# Patient Record
Sex: Female | Born: 1945 | Race: White | Hispanic: No | Marital: Married | Smoking: Former smoker
Health system: Southern US, Community
[De-identification: ages and names within clinical notes are randomized; demographics above are authoritative.]

## PROBLEM LIST (undated history)

## (undated) DIAGNOSIS — G43909 Migraine, unspecified, not intractable, without status migrainosus: Secondary | ICD-10-CM

## (undated) DIAGNOSIS — R911 Solitary pulmonary nodule: Secondary | ICD-10-CM

## (undated) DIAGNOSIS — C3490 Malignant neoplasm of unspecified part of unspecified bronchus or lung: Secondary | ICD-10-CM

## (undated) DIAGNOSIS — R0600 Dyspnea, unspecified: Secondary | ICD-10-CM

## (undated) DIAGNOSIS — I10 Essential (primary) hypertension: Secondary | ICD-10-CM

## (undated) DIAGNOSIS — E7800 Pure hypercholesterolemia, unspecified: Secondary | ICD-10-CM

## (undated) DIAGNOSIS — I350 Nonrheumatic aortic (valve) stenosis: Secondary | ICD-10-CM

## (undated) DIAGNOSIS — T783XXA Angioneurotic edema, initial encounter: Secondary | ICD-10-CM

## (undated) DIAGNOSIS — K219 Gastro-esophageal reflux disease without esophagitis: Secondary | ICD-10-CM

## (undated) DIAGNOSIS — I739 Peripheral vascular disease, unspecified: Secondary | ICD-10-CM

## (undated) DIAGNOSIS — M1990 Unspecified osteoarthritis, unspecified site: Secondary | ICD-10-CM

## (undated) HISTORY — DX: Migraine, unspecified, not intractable, without status migrainosus: G43.909

## (undated) HISTORY — PX: DILATION AND CURETTAGE OF UTERUS: SHX78

## (undated) HISTORY — PX: EYE SURGERY: SHX253

## (undated) HISTORY — PX: TONSILLECTOMY: SUR1361

---

## 2002-09-24 ENCOUNTER — Emergency Department (HOSPITAL_COMMUNITY): Admission: EM | Admit: 2002-09-24 | Discharge: 2002-09-24 | Payer: Self-pay | Admitting: Emergency Medicine

## 2005-08-04 HISTORY — PX: CAROTID STENT: SHX1301

## 2005-08-18 ENCOUNTER — Emergency Department (HOSPITAL_COMMUNITY): Admission: EM | Admit: 2005-08-18 | Discharge: 2005-08-18 | Payer: Self-pay | Admitting: Emergency Medicine

## 2005-09-19 ENCOUNTER — Emergency Department (HOSPITAL_COMMUNITY): Admission: EM | Admit: 2005-09-19 | Discharge: 2005-09-19 | Payer: Self-pay | Admitting: Emergency Medicine

## 2011-02-03 ENCOUNTER — Other Ambulatory Visit: Payer: Self-pay

## 2011-02-03 ENCOUNTER — Other Ambulatory Visit: Payer: Self-pay | Admitting: Obstetrics and Gynecology

## 2011-02-03 DIAGNOSIS — Z139 Encounter for screening, unspecified: Secondary | ICD-10-CM

## 2011-02-06 ENCOUNTER — Ambulatory Visit (HOSPITAL_COMMUNITY)
Admission: RE | Admit: 2011-02-06 | Discharge: 2011-02-06 | Disposition: A | Discharge status: Home / Self Care | Payer: Self-pay | Source: Ambulatory Visit | Attending: Obstetrics and Gynecology | Admitting: Obstetrics and Gynecology

## 2011-02-06 DIAGNOSIS — Z139 Encounter for screening, unspecified: Secondary | ICD-10-CM

## 2012-02-16 ENCOUNTER — Other Ambulatory Visit (HOSPITAL_COMMUNITY): Payer: Self-pay | Admitting: Physician Assistant

## 2012-02-16 DIAGNOSIS — Z139 Encounter for screening, unspecified: Secondary | ICD-10-CM

## 2012-02-24 ENCOUNTER — Ambulatory Visit (HOSPITAL_COMMUNITY)
Admission: RE | Admit: 2012-02-24 | Discharge: 2012-02-24 | Disposition: A | Discharge status: Home / Self Care | Payer: Self-pay | Source: Ambulatory Visit | Attending: Physician Assistant | Admitting: Physician Assistant

## 2012-02-24 DIAGNOSIS — Z1231 Encounter for screening mammogram for malignant neoplasm of breast: Secondary | ICD-10-CM | POA: Insufficient documentation

## 2012-02-24 DIAGNOSIS — Z139 Encounter for screening, unspecified: Secondary | ICD-10-CM

## 2013-02-01 DIAGNOSIS — T783XXA Angioneurotic edema, initial encounter: Secondary | ICD-10-CM

## 2013-02-01 HISTORY — DX: Angioneurotic edema, initial encounter: T78.3XXA

## 2013-02-27 ENCOUNTER — Encounter (HOSPITAL_COMMUNITY): Payer: Self-pay

## 2013-02-27 ENCOUNTER — Inpatient Hospital Stay (HOSPITAL_COMMUNITY)
Admission: EM | Admit: 2013-02-27 | Discharge: 2013-03-01 | DRG: 916 | Disposition: A | Discharge status: Home / Self Care | Payer: Medicare Other | Attending: Pulmonary Disease | Admitting: Pulmonary Disease

## 2013-02-27 DIAGNOSIS — T39095A Adverse effect of salicylates, initial encounter: Secondary | ICD-10-CM | POA: Diagnosis present

## 2013-02-27 DIAGNOSIS — E7800 Pure hypercholesterolemia, unspecified: Secondary | ICD-10-CM | POA: Diagnosis present

## 2013-02-27 DIAGNOSIS — T783XXD Angioneurotic edema, subsequent encounter: Secondary | ICD-10-CM

## 2013-02-27 DIAGNOSIS — K219 Gastro-esophageal reflux disease without esophagitis: Secondary | ICD-10-CM | POA: Diagnosis present

## 2013-02-27 DIAGNOSIS — T783XXA Angioneurotic edema, initial encounter: Principal | ICD-10-CM

## 2013-02-27 DIAGNOSIS — E785 Hyperlipidemia, unspecified: Secondary | ICD-10-CM | POA: Diagnosis present

## 2013-02-27 DIAGNOSIS — I10 Essential (primary) hypertension: Secondary | ICD-10-CM | POA: Diagnosis present

## 2013-02-27 DIAGNOSIS — F17200 Nicotine dependence, unspecified, uncomplicated: Secondary | ICD-10-CM | POA: Diagnosis present

## 2013-02-27 DIAGNOSIS — K148 Other diseases of tongue: Secondary | ICD-10-CM

## 2013-02-27 HISTORY — DX: Essential (primary) hypertension: I10

## 2013-02-27 HISTORY — DX: Angioneurotic edema, initial encounter: T78.3XXA

## 2013-02-27 HISTORY — DX: Peripheral vascular disease, unspecified: I73.9

## 2013-02-27 HISTORY — DX: Pure hypercholesterolemia, unspecified: E78.00

## 2013-02-27 HISTORY — DX: Gastro-esophageal reflux disease without esophagitis: K21.9

## 2013-02-27 LAB — CBC WITH DIFFERENTIAL/PLATELET
Basophils Absolute: 0 10*3/uL (ref 0.0–0.1)
Basophils Relative: 0 % (ref 0–1)
Eosinophils Absolute: 0.1 10*3/uL (ref 0.0–0.7)
Eosinophils Relative: 1 % (ref 0–5)
HCT: 44.1 % (ref 36.0–46.0)
Hemoglobin: 15.2 g/dL — ABNORMAL HIGH (ref 12.0–15.0)
Lymphocytes Relative: 14 % (ref 12–46)
Lymphs Abs: 2 10*3/uL (ref 0.7–4.0)
MCH: 32.9 pg (ref 26.0–34.0)
MCHC: 34.5 g/dL (ref 30.0–36.0)
MCV: 95.5 fL (ref 78.0–100.0)
Monocytes Absolute: 0.4 10*3/uL (ref 0.1–1.0)
Monocytes Relative: 3 % (ref 3–12)
Neutro Abs: 11.7 10*3/uL — ABNORMAL HIGH (ref 1.7–7.7)
Neutrophils Relative %: 82 % — ABNORMAL HIGH (ref 43–77)
Platelets: 289 10*3/uL (ref 150–400)
RBC: 4.62 MIL/uL (ref 3.87–5.11)
RDW: 13.5 % (ref 11.5–15.5)
WBC: 14.2 10*3/uL — ABNORMAL HIGH (ref 4.0–10.5)

## 2013-02-27 LAB — POCT I-STAT, CHEM 8
BUN: 16 mg/dL (ref 6–23)
Calcium, Ion: 1.21 mmol/L (ref 1.13–1.30)
Chloride: 110 mEq/L (ref 96–112)
Creatinine, Ser: 0.9 mg/dL (ref 0.50–1.10)
Glucose, Bld: 138 mg/dL — ABNORMAL HIGH (ref 70–99)
HCT: 46 % (ref 36.0–46.0)
Hemoglobin: 15.6 g/dL — ABNORMAL HIGH (ref 12.0–15.0)
Potassium: 3.6 mEq/L (ref 3.5–5.1)
Sodium: 142 mEq/L (ref 135–145)
TCO2: 22 mmol/L (ref 0–100)

## 2013-02-27 LAB — TYPE AND SCREEN
ABO/RH(D): O NEG
Antibody Screen: NEGATIVE

## 2013-02-27 MED ORDER — EPINEPHRINE 0.3 MG/0.3ML IJ SOAJ
0.3000 mg | Freq: Once | INTRAMUSCULAR | Status: AC
  Administered 2013-02-27: 0.3 mg via INTRAMUSCULAR
  Filled 2013-02-27: qty 0.3

## 2013-02-27 MED ORDER — SODIUM CHLORIDE 0.9 % IV SOLN
INTRAVENOUS | Status: DC
  Administered 2013-02-27: 17:00:00 via INTRAVENOUS

## 2013-02-27 MED ORDER — DIPHENHYDRAMINE HCL 50 MG/ML IJ SOLN
INTRAMUSCULAR | Status: AC
  Filled 2013-02-27: qty 1

## 2013-02-27 MED ORDER — DIPHENHYDRAMINE HCL 50 MG/ML IJ SOLN
50.0000 mg | Freq: Once | INTRAMUSCULAR | Status: AC
  Administered 2013-02-27: 50 mg via INTRAVENOUS
  Filled 2013-02-27: qty 1

## 2013-02-27 MED ORDER — FAMOTIDINE IN NACL 20-0.9 MG/50ML-% IV SOLN
20.0000 mg | Freq: Once | INTRAVENOUS | Status: AC
  Administered 2013-02-27: 20 mg via INTRAVENOUS
  Filled 2013-02-27: qty 50

## 2013-02-27 MED ORDER — FAMOTIDINE IN NACL 20-0.9 MG/50ML-% IV SOLN
20.0000 mg | Freq: Four times a day (QID) | INTRAVENOUS | Status: DC
  Administered 2013-02-27 – 2013-03-01 (×6): 20 mg via INTRAVENOUS
  Filled 2013-02-27 (×9): qty 50

## 2013-02-27 MED ORDER — DIPHENHYDRAMINE HCL 50 MG/ML IJ SOLN
25.0000 mg | Freq: Four times a day (QID) | INTRAMUSCULAR | Status: DC
  Administered 2013-02-27 – 2013-03-01 (×3): 25 mg via INTRAVENOUS
  Filled 2013-02-27 (×5): qty 0.5
  Filled 2013-02-27 (×3): qty 1
  Filled 2013-02-27: qty 0.5

## 2013-02-27 MED ORDER — METHYLPREDNISOLONE SODIUM SUCC 125 MG IJ SOLR
80.0000 mg | Freq: Four times a day (QID) | INTRAMUSCULAR | Status: DC
  Administered 2013-02-27 – 2013-03-01 (×6): 80 mg via INTRAVENOUS
  Filled 2013-02-27 (×2): qty 1.28
  Filled 2013-02-27: qty 2
  Filled 2013-02-27: qty 1.28
  Filled 2013-02-27: qty 2
  Filled 2013-02-27: qty 1.28
  Filled 2013-02-27: qty 2
  Filled 2013-02-27 (×5): qty 1.28
  Filled 2013-02-27: qty 2
  Filled 2013-02-27: qty 1.28

## 2013-02-27 MED ORDER — METHYLPREDNISOLONE SODIUM SUCC 125 MG IJ SOLR
125.0000 mg | Freq: Once | INTRAMUSCULAR | Status: AC
  Administered 2013-02-27: 125 mg via INTRAVENOUS
  Filled 2013-02-27: qty 2

## 2013-02-27 NOTE — Progress Notes (Signed)
eLink Physician-Brief Progress Note Patient Name: Courtney Gill DOB: 1945-09-21 MRN: [id number]  Date of Service  02/27/2013   HPI/Events of Note   Pt arrived from [facility], no overt distress  eICU Interventions   Basic admission orders placed, Solu-Medrol / Benadryl / Pepcid ordered Bedside MD to see    Intervention Category Intermediate Interventions: Communication with other healthcare providers and/or family  Lonia Farber 02/27/2013, 10:30 PM

## 2013-02-27 NOTE — ED Notes (Signed)
Transfusion complete. No reaction suspected.

## 2013-02-27 NOTE — ED Notes (Signed)
Pt c/o tongue swelling starting this afternoon.  Reports difficulty talking and swallowing.  Denies difficulty breathing.  Denies any new medications.  Denies any food allergies that she is aware of.  Reports had similar episode in the past and her doctor told her to take benadryl.  Pt took benadryl around 4:20 today.

## 2013-02-27 NOTE — ED Notes (Signed)
Pt c/o swelling of tongue which she first noticed around 2:30 this afternoon. Pt states swelling has become progressively worse. Pt denies difficulty breathing but does reports difficulty swallowing and talking. Pt states she had these symptoms once before and was told she was having an allergic reaction. Pt took benadryl at home pta without relief.

## 2013-02-27 NOTE — ED Provider Notes (Signed)
CSN: [id number]     Arrival date & time 02/27/13  1649 History  This chart was scribed for Ward Givens, MD by Ardelia Mems, ED Scribe. This patient was seen in room [facility]14[facility]14 and the patient's care was started at 5:04 PM.   First MD Initiated Contact with Patient 02/27/13 1657     Chief Complaint  Patient presents with  . Oral Swelling    The history is provided by the patient. No language interpreter was used.   HPI Comments: Courtney Gill is a 67 y.o. female with a history of hypercholesteremia who presents to the Emergency Department complaining of 2.5 hours of sudden onset, gradually worsening tongue swelling. She reports associated mild trouble swallowing and trouble talking but she denies itching and difficulty breathing as associated symptoms. She has taken benadryl at home without relief. She denies recent bee stings. She states that she had an occurrence of right-sided facial swelling with lip swelling on 01/27/13, but states that this is the only time she has had similar symptoms. She reports that facial swelling or similar symptoms do not run in her family. She states that her PCP took her off of her daily Amlodipine and increased her dosage of Simvastatin on 02/17/13. She states that her only other daily medications are acid reducers, fish oil, calcium and vitamin D. She denies fever, rash, wheezing, SOB or any other symptoms. There is no family history or similar swelling.     She denies alcohol use, but is a current every day smoker. She is retired.  PCP- Dr. Catalina Pizza   Past Medical History  Diagnosis Date  . Hypercholesterolemia    Past Surgical History  Procedure Laterality Date  . Eye surgery    . Tonsillectomy     No family history on file. History  Substance Use Topics  . Smoking status: Current Every Day Smoker  . Smokeless tobacco: Not on file  . Alcohol Use: No  lives at home Lives with spouse   OB History   Grav Para Term Preterm Abortions TAB SAB  Ect Mult Living                 Review of Systems  Constitutional: Negative for fever.  HENT: Positive for facial swelling and trouble swallowing.   Respiratory: Negative for shortness of breath and wheezing.   Skin: Negative for rash.  Neurological: Positive for speech difficulty (due to swelling).  All other systems reviewed and are negative.    Allergies  Review of patient's allergies indicates no known allergies.  Home Medications   Current Outpatient Rx  Name  Route  Sig  Dispense  Refill  . aspirin EC 81 MG tablet   Oral   Take 81 mg by mouth daily.         . Calcium Carbonate-Vitamin D (CALCIUM + D PO)   Oral   Take 1 tablet by mouth 2 (two) times daily.         . fish oil-omega-3 fatty acids 1000 MG capsule   Oral   Take 1 g by mouth 2 (two) times daily.         Marland Kitchen ibuprofen (ADVIL,MOTRIN) 200 MG tablet   Oral   Take 200 mg by mouth every 6 (six) hours as needed for pain.         Marland Kitchen omeprazole (PRILOSEC) 20 MG capsule   Oral   Take 20 mg by mouth every evening.         Marland Kitchen  simvastatin (ZOCOR) 20 MG tablet   Oral   Take 20 mg by mouth every evening.           BP 129/85  Pulse 86  Temp(Src) 98.4 F (36.9 C) (Oral)  Resp 17  SpO2 95%  Vital signs normal    Physical Exam  Nursing note and vitals reviewed. Constitutional: She is oriented to person, place, and time. She appears well-developed and well-nourished.  Non-toxic appearance. She does not appear ill. No distress.  HENT:  Head: Normocephalic and atraumatic.    Right Ear: External ear normal.  Left Ear: External ear normal.  Nose: Nose normal. No mucosal edema or rhinorrhea.  Mouth/Throat: Oropharynx is clear and moist and mucous membranes are normal. No dental abscesses or edematous.  Fullness and firmness submentally. Diffuse swelling of both sides of tongue. Talks like her tongue is thick.  Eyes: Conjunctivae and EOM are normal. Pupils are equal, round, and reactive to light.   Neck: Normal range of motion and full passive range of motion without pain. Neck supple.  Cardiovascular: Normal rate, regular rhythm and normal heart sounds.  Exam reveals no gallop and no friction rub.   No murmur heard. Pulmonary/Chest: Effort normal and breath sounds normal. No respiratory distress. She has no wheezes. She has no rhonchi. She has no rales. She exhibits no tenderness and no crepitus.  Abdominal: Soft. Normal appearance and bowel sounds are normal. She exhibits no distension. There is no tenderness. There is no rebound and no guarding.  Musculoskeletal: Normal range of motion. She exhibits no edema and no tenderness.  Moves all extremities well.   Neurological: She is alert and oriented to person, place, and time. She has normal strength. No cranial nerve deficit.  Skin: Skin is warm, dry and intact. No rash noted. No erythema. No pallor.  Psychiatric: She has a normal mood and affect. Her speech is normal and behavior is normal. Her mood appears not anxious.    ED Course   Medications  0.9 %  sodium chloride infusion ( Intravenous Stopped 02/27/13 2107)  methylPREDNISolone sodium succinate (SOLU-MEDROL) 125 mg/2 mL injection 125 mg (125 mg Intravenous Given 02/27/13 1715)  diphenhydrAMINE (BENADRYL) injection 50 mg (50 mg Intravenous Given 02/27/13 1713)  famotidine (PEPCID) IVPB 20 mg (0 mg Intravenous Stopped 02/27/13 1809)  EPINEPHrine (EPI-PEN) injection 0.3 mg (0.3 mg Intramuscular Given 02/27/13 1818)  FFP i unit  Procedures (including critical care time)  DIAGNOSTIC STUDIES: Oxygen Saturation is 95% on  RA, normal by my interpretation.    COORDINATION OF CARE: 5:15 PM- Pt advised of plan for medications to reduce her swelling, along with plan to be observed in the ED and pt agrees.  17:45 no change, not worse, not better.   6:05 PM- Recheck with pt and pt states that her swelling has slightly decreased after receiving medication. Her speech appears to have  improved. Pt denies personal history of cardiovascular disease or symptoms, but states that her mother's  brother died of an MI. Pt is advise of plan to receive an epinephrine injection and she is agreeable.  6:36 PM- Recheck with pt and she states that she feels no different after receiving epinephrine. Pt's tongue remains swollen. She appears to have more swelling in her submental area, left greater than right. Pt advised of plan to receive plasma and pt agrees.  21:00 Pt received 1 unit of FFP  19:07 Dr Marin Shutter, critical care at [facility], accepts in transfer to admission to ICU. Pt agreeable.  19:10 Pt's tongue is starting to look alittle less swollen, still has a lot of swelling in submental area.   9:32 PM- Recheck with pt and pt is feeling better. She indicates that her tongue swelling has markedly improved. She is able to move her tongue better. She still retains swelling in her submental area.  21:50 Dr Marin Shutter contacted that patient's swelling is much improved. EMS has just left to transport patient to [facility].   1. Tongue edema   2. Angioedema, initial encounter     Plan transfer to [facility] for admission   Devoria Albe, MD, FACEP   CRITICAL CARE Performed by: Devoria Albe L Total critical care time: 38 min Critical care time was exclusive of separately billable procedures and treating other patients. Critical care was necessary to treat or prevent imminent or life-threatening deterioration. Critical care was time spent personally by me on the following activities: development of treatment plan with patient and/or surrogate as well as nursing, discussions with consultants, evaluation of patient's response to treatment, examination of patient, obtaining history from patient or surrogate, ordering and performing treatments and interventions, ordering and review of laboratory studies, ordering and review of radiographic studies, pulse oximetry and re-evaluation of patient's  condition.   MDM  Pt had a mild episode of angioedema of her right lips about 1 month ago and presents today with angioedema of her tongue. She is not on an ACEI but she is on a statin, which can also trigger angioedema. Her statin dose was increased recently also.     I personally performed the services described in this documentation, which was scribed in my presence. The recorded information has been reviewed and considered.  Devoria Albe, MD, Armando Gang    Ward Givens, MD 02/27/13 2224

## 2013-02-28 DIAGNOSIS — E785 Hyperlipidemia, unspecified: Secondary | ICD-10-CM | POA: Diagnosis present

## 2013-02-28 DIAGNOSIS — T783XXA Angioneurotic edema, initial encounter: Secondary | ICD-10-CM

## 2013-02-28 DIAGNOSIS — J384 Edema of larynx: Secondary | ICD-10-CM

## 2013-02-28 DIAGNOSIS — I10 Essential (primary) hypertension: Secondary | ICD-10-CM | POA: Diagnosis present

## 2013-02-28 LAB — CREATININE, SERUM
Creatinine, Ser: 0.83 mg/dL (ref 0.50–1.10)
GFR calc Af Amer: 84 mL/min — ABNORMAL LOW (ref >90)
GFR calc non Af Amer: 72 mL/min — ABNORMAL LOW (ref >90)

## 2013-02-28 LAB — PREPARE FRESH FROZEN PLASMA: Unit division: 0

## 2013-02-28 LAB — CBC
HCT: 40.9 % (ref 36.0–46.0)
Hemoglobin: 14.5 g/dL (ref 12.0–15.0)
MCH: 33.3 pg (ref 26.0–34.0)
MCHC: 35.5 g/dL (ref 30.0–36.0)
MCV: 94 fL (ref 78.0–100.0)
Platelets: 223 10*3/uL (ref 150–400)
RBC: 4.35 MIL/uL (ref 3.87–5.11)
RDW: 13.4 % (ref 11.5–15.5)
WBC: 5.9 10*3/uL (ref 4.0–10.5)

## 2013-02-28 LAB — MRSA PCR SCREENING: MRSA by PCR: NEGATIVE

## 2013-02-28 MED ORDER — IBUPROFEN 400 MG PO TABS
400.0000 mg | ORAL_TABLET | Freq: Once | ORAL | Status: AC
  Administered 2013-02-28: 400 mg via ORAL
  Filled 2013-02-28: qty 1

## 2013-02-28 MED ORDER — SODIUM CHLORIDE 0.9 % IV SOLN: 250.0000 mL | INTRAVENOUS | Status: DC | PRN

## 2013-02-28 MED ORDER — HEPARIN SODIUM (PORCINE) 5000 UNIT/ML IJ SOLN
5000.0000 [IU] | Freq: Three times a day (TID) | INTRAMUSCULAR | Status: DC
  Administered 2013-02-28 – 2013-03-01 (×5): 5000 [IU] via SUBCUTANEOUS
  Filled 2013-02-28 (×9): qty 1

## 2013-02-28 NOTE — Progress Notes (Signed)
eLink Physician-Brief Progress Note Patient Name: Courtney Gill DOB: [birth year] MRN: [id number]  Date of Service  02/28/2013   HPI/Events of Note   Pt c/o HA. She is on and appears to tolerate ibuprofen at home. No current evidence to suggest that this is culprit med for angioedema.   eICU Interventions  One-time dose ibuprofen 400, follow swelling. If tolerated then can add to [facility] as a prn med   Intervention Category Intermediate Interventions: Pain - evaluation and management  [name] S. 02/28/2013, 1:40 AM

## 2013-02-28 NOTE — Progress Notes (Signed)
Patient is "Floor" status at this time.  Awaiting med-surg floor bed availability for transfer as ordered per Dr.Wright.  No acute distress noted Safety maintained. VSS.

## 2013-02-28 NOTE — Progress Notes (Addendum)
Verbal/phone report given to "Mardella Layman" RN on unit 6N.  Patient to be transferred to room 6N21C-01 via wheelchair to bed. All belongings including patient's purse, cell phone and charger sent with patient to 6N.  No acute distress noted. VSS. Safety maintained.

## 2013-02-28 NOTE — Progress Notes (Signed)
PULMONARY  / CRITICAL CARE MEDICINE  Name: Courtney Gill MRN: [id number] DOB: 12-03-1945    ADMISSION DATE:  02/27/2013  PRIMARY SERVICE: PCCM  CHIEF COMPLAINT:  Tongue swelling. Referred from [facility] for management of angioedema.   BRIEF PATIENT DESCRIPTION:  67 years old female with PMH relevant for dyslipidemia and mild hypertension not on any medications. Presented to [facility] with angioedema of the tongue. Transferred to [facility] ICU for close monitoring of her airway.   SIGNIFICANT EVENTS / STUDIES:    LINES / TUBES: - Peripheral IV's  CULTURES: No cultures  ANTIBIOTICS: No antibiotics   SUBJECTIVE:  Swelling is gone Pt was taking simvastatin, amlodipine, omeprazole at home before. Also fish oil, calcium/vit D VITAL SIGNS: Temp:  [97.5 F (36.4 C)-98.4 F (36.9 C)] 97.5 F (36.4 C) (07/28 0804) Pulse Rate:  [54-86] 64 (07/28 0800) Resp:  [11-24] 12 (07/28 0800) BP: (112-139)/(49-85) 122/67 mmHg (07/28 0800) SpO2:  [93 %-97 %] 97 % (07/28 0800) Weight:  [69.4 kg (153 lb)] 69.4 kg (153 lb) (07/28 0500) HEMODYNAMICS:   VENTILATOR SETTINGS:   INTAKE / OUTPUT: Intake/Output     07/27 0701 - 07/28 0700 07/28 0701 - 07/29 0700   Blood 316    IV Piggyback 100    Total Intake(mL/kg) 416 (6)    Net +416          Urine Occurrence 2 x      PHYSICAL EXAMINATION: General: No apparent distress. Eyes: Anicteric sclerae. ENT: Oropharynx clear. Moist mucous membranes. No thrush, tongue edema gone Lymph: No cervical, supraclavicular, or axillary lymphadenopathy. Heart: Normal S1, S2. No murmurs, rubs, or gallops appreciated. No bruits, equal pulses. Lungs: Normal excursion, no dullness to percussion. Good air movement bilaterally, without wheezes or crackles. Normal upper airway sounds without evidence of stridor. Abdomen: Abdomen soft, non-tender and not distended, normoactive bowel sounds. No hepatosplenomegaly or masses. Musculoskeletal: No clubbing or  synovitis. Skin: No rashes or lesions Neuro: No focal neurologic deficits.  LABS:  Recent Labs Lab 02/27/13 1859 02/27/13 1915 02/28/13 0405  HGB 15.2* 15.6* 14.5  WBC 14.2*  --  5.9  PLT 289  --  223  NA  --  142  --   K  --  3.6  --   CL  --  110  --   GLUCOSE  --  138*  --   BUN  --  16  --   CREATININE  --  0.90 0.83   No results found for this basename: GLUCAP,  in the last 168 hours   ASSESSMENT / PLAN:  PULMONARY A: 1) Angioedema, likely related to aspirin. No history of asthma but aspirin related angioedema and asthma only coexist in about 20% of the cases. Aspirin angioedema is a non allergic reaction most of the time. Angioedema has resolved  P:   - stay off [employer] PPI and simvastatin allergy Tfr to floor, d/c later today if no issues with edema    Dorcas Carrow Beeper  [phone]  Cell  [phone]  If no response or cell goes to voicemail, call beeper [phone]  Pulmonary and Critical Care Medicine [facility] Pager: [phone]  02/28/2013, 9:35 AM

## 2013-02-28 NOTE — H&P (Signed)
PULMONARY  / CRITICAL CARE MEDICINE  Name: Courtney Gill MRN: [id number] DOB: 30-Nov-1945    ADMISSION DATE:  02/27/2013  PRIMARY SERVICE: PCCM  CHIEF COMPLAINT:  Tongue swelling. Referred from [facility] for management of angioedema.   BRIEF PATIENT DESCRIPTION:  67 years old female with PMH relevant for dyslipidemia and mild hypertension not on any medications. Presented to [facility] with angioedema of the tongue. Transferred to [facility] ICU for close monitoring of her airway.   SIGNIFICANT EVENTS / STUDIES:    LINES / TUBES: - Peripheral IV's  CULTURES: No cultures  ANTIBIOTICS: No antibiotics  HISTORY OF PRESENT ILLNESS:   67 years old female with PMH relevant for dyslipidemia and mild hypertension not on any medications. Presented to [facility] with 3 hrs history of worsening tongue swelling with no obvious trigger. She had a similar episode with more lip and facial swelling on July 26. She is not on an ACE inhibitor but she does take aspirin daily. Denies CP, SOB, wheezing, fever, rash. She does not have history of asthma. At [facility] she was hemodynamically stable, saturating fine and in no distress at all times. She receive 1 dose of IM epinephrine and one unit of FFP with significant improvement. She was transferred to [facility] for close monitoring of her airway. At the time of my exam the patient is awake, alert, oriented and hemodynamically stable. Her tongue swelling is significantly improved.   PAST MEDICAL HISTORY :  Past Medical History  Diagnosis Date  . Hypercholesterolemia    Past Surgical History  Procedure Laterality Date  . Eye surgery    . Tonsillectomy     Prior to Admission medications   Medication Sig Start Date End Date Taking? Authorizing Provider  aspirin EC 81 MG tablet Take 81 mg by mouth daily.   Yes Historical Provider, MD  Calcium Carbonate-Vitamin D (CALCIUM + D PO) Take 1 tablet by mouth 2 (two) times daily.   Yes Historical Provider, MD  fish  oil-omega-3 fatty acids 1000 MG capsule Take 1 g by mouth 2 (two) times daily.   Yes Historical Provider, MD  ibuprofen (ADVIL,MOTRIN) 200 MG tablet Take 200 mg by mouth every 6 (six) hours as needed for pain.   Yes Historical Provider, MD  omeprazole (PRILOSEC) 20 MG capsule Take 20 mg by mouth every evening.   Yes Historical Provider, MD  simvastatin (ZOCOR) 20 MG tablet Take 20 mg by mouth every evening.   Yes Historical Provider, MD   No Known Allergies  FAMILY HISTORY:  No family history on file. SOCIAL HISTORY:  reports that she has been smoking.  She does not have any smokeless tobacco history on file. She reports that she does not drink alcohol or use illicit drugs.  REVIEW OF SYSTEMS:   All systems reviewed and found negative except for what I mentioned in the HPI.  SUBJECTIVE:   VITAL SIGNS: Temp:  [98 F (36.7 C)-98.4 F (36.9 C)] 98 F (36.7 C) (07/27 2100) Pulse Rate:  [66-86] 66 (07/28 0000) Resp:  [11-24] 11 (07/28 0000) BP: (126-139)/(50-85) 133/57 mmHg (07/28 0000) SpO2:  [93 %-97 %] 97 % (07/28 0000) HEMODYNAMICS:   VENTILATOR SETTINGS:   INTAKE / OUTPUT: Intake/Output     07/27 0701 - 07/28 0700   Blood 316   IV Piggyback 50   Total Intake 366   Net +366       Urine Occurrence 1 x     PHYSICAL EXAMINATION: General: No apparent  distress. Eyes: Anicteric sclerae. ENT: Oropharynx clear. Moist mucous membranes. No thrush, mild tongue swelling. Lymph: No cervical, supraclavicular, or axillary lymphadenopathy. Heart: Normal S1, S2. No murmurs, rubs, or gallops appreciated. No bruits, equal pulses. Lungs: Normal excursion, no dullness to percussion. Good air movement bilaterally, without wheezes or crackles. Normal upper airway sounds without evidence of stridor. Abdomen: Abdomen soft, non-tender and not distended, normoactive bowel sounds. No hepatosplenomegaly or masses. Musculoskeletal: No clubbing or synovitis. Skin: No rashes or lesions Neuro: No  focal neurologic deficits.  LABS:  Recent Labs Lab 02/27/13 1859 02/27/13 1915  HGB 15.2* 15.6*  WBC 14.2*  --   PLT 289  --   NA  --  142  K  --  3.6  CL  --  110  GLUCOSE  --  138*  BUN  --  16  CREATININE  --  0.90   No results found for this basename: GLUCAP,  in the last 168 hours   ASSESSMENT / PLAN:  PULMONARY A: 1) Angioedema, likely related to aspirin. No history of asthma but aspirin related angioedema and asthma only coexist in about 20% of the cases. Aspirin angioedema is a non allergic reaction most of the time. P:   - Significant improvement after one unit of FFP - Will do short term solumedrol and benadryl - Recommend not to restart aspirin since this is the most likely cause. - will continue close monitoring of her airway in the ICU  CARDIOVASCULAR A:  1) Hemodynamically stable P:  - ICU monitoring - DVT prophylaxis with SQ heparin  RENAL A:   1) No issues   GASTROINTESTINAL A:   1) No issues 2) History of GERD P: - PEPCID  HEMATOLOGIC A:   1) No issues   INFECTIOUS A:  1) Elevated WBC. No evidence of acute infection P:   - No antibiotics  ENDOCRINE A:   1) No issues    NEUROLOGIC A:   1) No issues   I have personally obtained a history, examined the patient, evaluated laboratory and imaging results, formulated the assessment and plan and placed orders. CRITICAL CARE: Critical Care Time devoted to patient care services described in this note is 45 minutes.   Overton Mam, MD Pulmonary and Critical Care Medicine [facility] Pager: [phone]  02/28/2013, 1:34 AM

## 2013-03-01 ENCOUNTER — Encounter (HOSPITAL_COMMUNITY): Payer: Self-pay | Admitting: Pulmonary Disease

## 2013-03-01 DIAGNOSIS — Z5189 Encounter for other specified aftercare: Secondary | ICD-10-CM

## 2013-03-01 DIAGNOSIS — J384 Edema of larynx: Secondary | ICD-10-CM

## 2013-03-01 MED ORDER — PREDNISONE 20 MG PO TABS: ORAL_TABLET | ORAL | Status: DC

## 2013-03-01 MED ORDER — FAMOTIDINE 20 MG PO TABS
20.0000 mg | ORAL_TABLET | Freq: Two times a day (BID) | ORAL | Status: DC
Start: 2013-03-01 — End: 2013-03-01
  Administered 2013-03-01: 20 mg via ORAL
  Filled 2013-03-01: qty 1

## 2013-03-01 MED ORDER — DIPHENHYDRAMINE HCL 50 MG/ML IJ SOLN: 25.0000 mg | Freq: Four times a day (QID) | INTRAMUSCULAR | Status: DC | PRN

## 2013-03-01 MED ORDER — CLOPIDOGREL BISULFATE 75 MG PO TABS
75.0000 mg | ORAL_TABLET | Freq: Every day | ORAL | Status: DC
  Administered 2013-03-01: 75 mg via ORAL
  Filled 2013-03-01: qty 1

## 2013-03-01 MED ORDER — PREDNISONE 20 MG PO TABS
20.0000 mg | ORAL_TABLET | Freq: Every day | ORAL | Status: DC
  Administered 2013-03-01: 20 mg via ORAL
  Filled 2013-03-01: qty 1

## 2013-03-01 MED ORDER — FAMOTIDINE 20 MG PO TABS: 20.0000 mg | ORAL_TABLET | Freq: Two times a day (BID) | ORAL | Status: DC

## 2013-03-01 MED ORDER — DIPHENHYDRAMINE HCL 50 MG PO CAPS: 50.0000 mg | ORAL_CAPSULE | Freq: Four times a day (QID) | ORAL | Status: DC | PRN

## 2013-03-01 MED ORDER — CLOPIDOGREL BISULFATE 75 MG PO TABS: 75.0000 mg | ORAL_TABLET | Freq: Every day | ORAL | Status: DC

## 2013-03-01 MED ORDER — EPINEPHRINE 0.3 MG/0.3ML IJ SOAJ: 0.3000 mg | Freq: Once | INTRAMUSCULAR | Status: DC

## 2013-03-01 MED ORDER — PREDNISONE 10 MG PO TABS: ORAL_TABLET | ORAL | Status: DC

## 2013-03-01 MED ORDER — DIPHENHYDRAMINE HCL 25 MG PO CAPS: 50.0000 mg | ORAL_CAPSULE | Freq: Four times a day (QID) | ORAL | Status: DC | PRN

## 2013-03-01 NOTE — Progress Notes (Signed)
Pt and her boyfriend given discharge instructions.  They verbalized understanding of all instructions.  Also given 5 new prescriptions and she verbalized understanding of those meds.  No further questions at this time.  Pt taken down via wheelchair for discharge home.

## 2013-03-01 NOTE — Progress Notes (Addendum)
PULMONARY  / CRITICAL CARE MEDICINE  Name: Courtney Gill MRN: [id number] DOB: Jun 27, 1946    ADMISSION DATE:  02/27/2013  PRIMARY SERVICE: PCCM  CHIEF COMPLAINT:  Tongue swelling. Referred from [facility] for management of angioedema.   BRIEF PATIENT DESCRIPTION:  67 yo with hx of hyperlipidemia, HTN, GERD, and PAD s/p Rt carotid stent in 2007 transferred from [facility] with angioedema of her tongue.  SIGNIFICANT EVENTS: 7/27 transfer to [facility] 7/28 transfer to medical floor  SUBJECTIVE:  Feels much better.  Denies cough, wheeze, dysphagia/odynophagia, slurring of speech, or chest pain.  She reports her symptoms started after she ate at [employer], but does not recall eating anything unusual.  She has been on aspirin, omeprazole, fish oil, calcium/vitamin D and simvastatin for years.  She does not recall taking any other new medications.  VITAL SIGNS: Temp:  [96.8 F (36 C)-99 F (37.2 C)] 97.9 F (36.6 C) (07/29 0553) Pulse Rate:  [60-76] 60 (07/29 0553) Resp:  [14-20] 16 (07/29 0553) BP: (101-136)/(49-69) 136/65 mmHg (07/29 0553) SpO2:  [95 %-100 %] 98 % (07/29 0553) Room air  INTAKE / OUTPUT: Intake/Output     07/28 0701 - 07/29 0700 07/29 0701 - 07/30 0700   P.O. 600    Blood     IV Piggyback 50    Total Intake(mL/kg) 650 (9.4)    Net +650          Urine Occurrence 1 x    Stool Occurrence 1 x      PHYSICAL EXAMINATION: General: No distress ENT: No sinus tenderness, no oral exudate, no LAN Heart: regular Lungs: fluent speech, no wheeze Abdomen: soft, non tender Musculoskeletal: no edema Skin: No rashes or lesions Neuro: Normal strength  LABS: CBC Recent Labs     02/27/13  1859  02/27/13  1915  02/28/13  0405  WBC  14.2*   --   5.9  HGB  15.2*  15.6*  14.5  HCT  44.1  46.0  40.9  PLT  289   --   223    BMET Recent Labs     02/27/13  1915  02/28/13  0405  NA  142   --   K  3.6   --   CL  110   --   BUN  16   --   CREATININE  0.90  0.83  GLUCOSE   138*   --     ASSESSMENT / PLAN:  Angioedema ?cause >> concern this is relate to aspirin >> much improved. P: -d/c home today -she will need outpt follow up with PCP later this week >> defer to PCP whether she needs allergist evaluation -she will need Epi-pen at home until further assessed by PCP -change to prednisone 20 mg daily, and wean off over course of next 5 days -continue pepcid 20 mg BID in place of omeprazole until re-assessed by PCP -can use benadryl 50 mg q6h prn for itching, rash, or swelling -hold ASA, simvastatin, fish oil, calcium/Vit D until re-assessed as outpt  PAD s/p Rt carotid stent in 2007 by Dr. Domingo Dimes [phone]). P: -she needs to be on anti-platelet therapy for now until f/u with Dr. Lanna Poche can be arranged -spoke with Dr. Pecola Lawless covering partner Dr. Manson Passey [phone]) in reference to replacement for ASA therapy >> recommend using plavix 75 mg daily in place of ASA until she can be re-assesses as outpt  Hx of Hyperlipidemia. P: -hold simvastatin until f/u with PCP  Hx of GERD P: -hold omeprazole -continue pepcid  Tobacco abuse. P: -will need smoking cessation counseling   Stable for d/c home.  She will need outpt f/u with her PCP Dr. [name] in next week.  She will then need to schedule f/u with Dr. Lanna Poche.  [name], MD [facility] Pulmonary/Critical Care 03/01/2013, 9:41 AM Pager:  [phone] After 3pm call: [phone]

## 2013-03-01 NOTE — Discharge Summary (Signed)
Physician Discharge Summary  Patient ID: Courtney Gill MRN: [id number] DOB/AGE: [birth year] 65 y.o.  Admit date: 02/27/2013 Discharge date: 03/01/2013    Discharge Diagnoses:  Angioedema PAD s/p Right Carotid Stent (2007) Hyperlipidemia GERD Tobacco Abuse                                                                     DISCHARGE PLAN BY DIAGNOSIS      Angioedema - of unclear cause >> concern this is relate to aspirin >> much improved.   Discharge Plan: - follow up with PCP later this week >> defer to PCP whether she needs allergist evaluation  -Epi-pen at home until further assessed by PCP  -prednisone 20 mg daily, and wean off over course of next 5 days  -continue pepcid 20 mg BID in place of omeprazole until re-assessed by PCP  -can use benadryl 50 mg q6h prn for itching, rash, or swelling  -hold ASA, simvastatin, fish oil, calcium/Vit D until re-assessed as outpt   PAD s/p Rt carotid stent in 2007 by Dr. Domingo Dimes [phone]).   Discharge Plan: -continue anti-platelet therapy for now until f/u with Dr. Lanna Poche  -spoke with Dr. Pecola Lawless covering partner Dr. Manson Passey [phone]) in reference to replacement for ASA therapy >> recommend using plavix 75 mg daily in place of ASA until she can be re-assesses as outpt   Hx of Hyperlipidemia.   Discharge Plan: -hold simvastatin until f/u with PCP   Hx of GERD   Discharge Plan: -hold omeprazole  -continue pepcid   Tobacco abuse.   Discharge Plan: -smoking cessation counseling                       DISCHARGE SUMMARY   Courtney Gill is a 67 y.o. y/o female with a PMH of hyperlipidemia, HTN, GERD, and PAD s/p Rt carotid stent in 2007 transferred from [facility] with angioedema of her tongue.  She presented to [facility] with 3 hrs history of worsening tongue swelling with no obvious trigger. She had a similar episode with more lip and facial swelling on July 26. She is not on an ACE inhibitor but she does take aspirin  daily.  She received IM epinephrine and was transferred to [facility] for further treatment.  She was treated in the usual fashion with scheduled steroids, pepcid, and benadryl.  Her symptoms quickly resolved.  During admit, she was hemodynamically stable with good saturations and no distress.   Home medications were held upon admit.  She is on an aspirin daily for history of R carotid stent.  Aspirin was held and patient was started on Plavix in the setting of concern for angioedema from ASA.  It was recommended that she stay on plavix until follow up with Dr. Lanna Poche as an outpatient.  All other home medications were held as well and will need to be reviewed by PCP for restart once appropriate.  She will wean off oral steroids over a period of 5 days and will continue on pepcid as well.     SIGNIFICANT EVENTS:  7/27 transfer to [facility]  7/28 transfer to medical floor   Discharge Exam: General: No distress  ENT: No sinus tenderness, no oral exudate, no  LAN  Heart: regular  Lungs: fluent speech, no wheeze  Abdomen: soft, non tender  Musculoskeletal: no edema  Skin: No rashes or lesions  Neuro: Normal strength    Filed Vitals:   02/28/13 1751 02/28/13 2115 03/01/13 0226 03/01/13 0553  BP: 120/57 101/53 111/63 136/65  Pulse: 73 72 70 60  Temp: 98.8 F (37.1 C) 99 F (37.2 C) 96.8 F (36 C) 97.9 F (36.6 C)  TempSrc: Oral Oral Oral Oral  Resp: 18 18 16 16   Height:      Weight:      SpO2: 95% 100% 100% 98%     Discharge Labs  BMET  Recent Labs Lab 02/27/13 1915 02/28/13 0405  NA 142  --   K 3.6  --   CL 110  --   GLUCOSE 138*  --   BUN 16  --   CREATININE 0.90 0.83    CBC  Recent Labs Lab 02/27/13 1859 02/27/13 1915 02/28/13 0405  HGB 15.2* 15.6* 14.5  HCT 44.1 46.0 40.9  WBC 14.2*  --  5.9  PLT 289  --  223        Follow-up Information   Follow up with Catalina Pizza, MD On 03/04/2013. (Appt at 2:00 PM)    Contact information:    [address]  [place]  [address] [phone]       Follow up with Sherron Ales., MD On 03/14/2013. (Appt at 4 PM)    Contact information:   [address]  [address] [place], [place] [address] [phone]         Medication List    STOP taking these medications       aspirin EC 81 MG tablet     CALCIUM + D PO     fish oil-omega-3 fatty acids 1000 MG capsule     ibuprofen 200 MG tablet  Commonly known as:  ADVIL,MOTRIN     omeprazole 20 MG capsule  Commonly known as:  PRILOSEC     simvastatin 20 MG tablet  Commonly known as:  ZOCOR      TAKE these medications       clopidogrel 75 MG tablet  Commonly known as:  PLAVIX  Take 1 tablet (75 mg total) by mouth daily with breakfast.     diphenhydrAMINE 50 MG capsule  Commonly known as:  BENADRYL  Take 1 capsule (50 mg total) by mouth every 6 (six) hours as needed for itching or allergies.     EPINEPHrine 0.3 mg/0.3 mL Soaj  Commonly known as:  EPI-PEN  Inject 0.3 mLs (0.3 mg total) into the muscle once.     famotidine 20 MG tablet  Commonly known as:  PEPCID  Take 1 tablet (20 mg total) by mouth 2 (two) times daily.     predniSONE 10 MG tablet  Commonly known as:  DELTASONE  2 tabs for 2 days, then 1 tab for 3 days and stop          Disposition: Home.  No home health needs identified prior to discharge.   Discharged Condition: Courtney Gill has met maximum benefit of inpatient care and is medically stable and cleared for discharge.  Patient is pending follow up as above.      Time spent on disposition:  Greater than 35 minutes.   Signed: Canary Brim, NP-C [facility] Pgr: [phone] Office: [phone]    Reviewed above and agree.  [name], MD [facility] 03/01/2013, 1:30 PM Pager:  [phone] After 3pm call: [phone]

## 2014-02-09 ENCOUNTER — Emergency Department (HOSPITAL_COMMUNITY)
Admission: EM | Admit: 2014-02-09 | Discharge: 2014-02-10 | Disposition: A | Discharge status: Home / Self Care | Payer: Medicare Other | Attending: Emergency Medicine | Admitting: Emergency Medicine

## 2014-02-09 DIAGNOSIS — K219 Gastro-esophageal reflux disease without esophagitis: Secondary | ICD-10-CM | POA: Diagnosis not present

## 2014-02-09 DIAGNOSIS — I10 Essential (primary) hypertension: Secondary | ICD-10-CM | POA: Diagnosis not present

## 2014-02-09 DIAGNOSIS — Z79899 Other long term (current) drug therapy: Secondary | ICD-10-CM | POA: Diagnosis not present

## 2014-02-09 DIAGNOSIS — R221 Localized swelling, mass and lump, neck: Secondary | ICD-10-CM | POA: Diagnosis present

## 2014-02-09 DIAGNOSIS — Z8639 Personal history of other endocrine, nutritional and metabolic disease: Secondary | ICD-10-CM | POA: Insufficient documentation

## 2014-02-09 DIAGNOSIS — T783XXA Angioneurotic edema, initial encounter: Secondary | ICD-10-CM | POA: Diagnosis not present

## 2014-02-09 DIAGNOSIS — Z862 Personal history of diseases of the blood and blood-forming organs and certain disorders involving the immune mechanism: Secondary | ICD-10-CM | POA: Diagnosis not present

## 2014-02-09 DIAGNOSIS — Z7902 Long term (current) use of antithrombotics/antiplatelets: Secondary | ICD-10-CM | POA: Diagnosis not present

## 2014-02-09 DIAGNOSIS — R220 Localized swelling, mass and lump, head: Secondary | ICD-10-CM | POA: Diagnosis present

## 2014-02-10 ENCOUNTER — Encounter (HOSPITAL_COMMUNITY): Payer: Self-pay | Admitting: Emergency Medicine

## 2014-02-10 DIAGNOSIS — T783XXA Angioneurotic edema, initial encounter: Secondary | ICD-10-CM | POA: Diagnosis not present

## 2014-02-10 MED ORDER — PREDNISONE 20 MG PO TABS: 40.0000 mg | ORAL_TABLET | Freq: Every day | ORAL | Status: DC

## 2014-02-10 MED ORDER — DIPHENHYDRAMINE HCL 25 MG PO TABS: 25.0000 mg | ORAL_TABLET | Freq: Four times a day (QID) | ORAL | Status: DC | PRN

## 2014-02-10 MED ORDER — FAMOTIDINE IN NACL 20-0.9 MG/50ML-% IV SOLN
20.0000 mg | INTRAVENOUS | Status: AC
  Administered 2014-02-10: 20 mg via INTRAVENOUS
  Filled 2014-02-10: qty 50

## 2014-02-10 MED ORDER — METHYLPREDNISOLONE SODIUM SUCC 125 MG IJ SOLR
125.0000 mg | Freq: Once | INTRAMUSCULAR | Status: AC
  Administered 2014-02-10: 125 mg via INTRAVENOUS
  Filled 2014-02-10: qty 2

## 2014-02-10 MED ORDER — FAMOTIDINE 20 MG PO TABS: 20.0000 mg | ORAL_TABLET | Freq: Two times a day (BID) | ORAL | Status: DC

## 2014-02-10 MED ORDER — DIPHENHYDRAMINE HCL 50 MG/ML IJ SOLN
25.0000 mg | Freq: Once | INTRAMUSCULAR | Status: AC
  Administered 2014-02-10: 25 mg via INTRAVENOUS
  Filled 2014-02-10: qty 1

## 2014-02-10 NOTE — ED Notes (Signed)
Gave patient warm blanket and coke as requested and approved by MD.

## 2014-02-10 NOTE — ED Notes (Signed)
Patient c/o tongue swelling; states was seen last June for the same.  Denies any new food, drink or medication.

## 2014-02-10 NOTE — ED Provider Notes (Signed)
CSN: [id number]     Arrival date & time 02/09/14  2351 History   This chart was scribed for Courtney RollerBrian D Andri Prestia, MD by Modena JanskyAlbert Thayil, ED Scribe. This patient was seen in room [facility] and the patient's care was started at 12:12 AM.   Chief Complaint  Patient presents with  . Allergic Reaction   HPI HPI Comments: Courtney Gill is a 68 y.o. female who presents to the Emergency Department complaining of an acute persistant allergic reaction that started about an hour ago. She states that her tongue has been swelling. Pt reports that she took benadryl with no relief. Pt was seen about a year ago for the same complaint. She reports documented allergy to NSAID including ASA but pt states that she has not been taking those. She denies any new medication or hygiene products. She also denies any unusual food or drink. She denies any associated SOB, rash or itchiness.  Nothing makes this better or worse.  Sx are persistent.    Past Medical History  Diagnosis Date  . Hypercholesterolemia   . Hypertension   . GERD (gastroesophageal reflux disease)   . Peripheral artery disease   . Angioedema July 2014   Past Surgical History  Procedure Laterality Date  . Eye surgery    . Tonsillectomy    . Carotid stent Right 2007   No family history on file. History  Substance Use Topics  . Smoking status: Current Every Day Smoker  . Smokeless tobacco: Not on file  . Alcohol Use: No   OB History   Grav Para Term Preterm Abortions TAB SAB Ect Mult Living                 Review of Systems  Skin: Negative for rash.  All other systems reviewed and are negative.   Allergies  Aspirin  Home Medications   Prior to Admission medications   Medication Sig Start Date End Date Taking? Authorizing Provider  clopidogrel (PLAVIX) 75 MG tablet Take 1 tablet (75 mg total) by mouth daily with breakfast. 03/01/13  Yes Jeanella CrazeBrandi L Ollis, NP  diphenhydrAMINE (BENADRYL) 50 MG capsule Take 1 capsule (50 mg total) by mouth  every 6 (six) hours as needed for itching or allergies. 03/01/13  Yes Jeanella CrazeBrandi L Ollis, NP  EPINEPHrine (EPI-PEN) 0.3 mg/0.3 mL SOAJ Inject 0.3 mLs (0.3 mg total) into the muscle once. 03/01/13  Yes Jeanella CrazeBrandi L Ollis, NP  pantoprazole (PROTONIX) 40 MG tablet Take 40 mg by mouth daily.   Yes Historical Provider, MD  diphenhydrAMINE (BENADRYL) 25 MG tablet Take 1 tablet (25 mg total) by mouth every 6 (six) hours as needed for itching (Rash). 02/10/14   Courtney RollerBrian D [name], MD  famotidine (PEPCID) 20 MG tablet Take 1 tablet (20 mg total) by mouth 2 (two) times daily. 03/01/13   Jeanella CrazeBrandi L Ollis, NP  famotidine (PEPCID) 20 MG tablet Take 1 tablet (20 mg total) by mouth 2 (two) times daily. 02/10/14   Courtney RollerBrian D Shanah Guimaraes, MD  predniSONE (DELTASONE) 10 MG tablet 2 tabs for 2 days, then 1 tab for 3 days and stop 03/01/13   Jeanella CrazeBrandi L Ollis, NP  predniSONE (DELTASONE) 20 MG tablet Take 2 tablets (40 mg total) by mouth daily. 02/10/14   Courtney RollerBrian D [name], MD   BP 146/85  Pulse 86  Temp(Src) 98.1 F (36.7 C) (Oral)  Resp 18  Ht 5\' 4"  (1.626 m)  Wt 148 lb (67.132 kg)  BMI 25.39 kg/m2  SpO2 99% Physical  Exam  Nursing note and vitals reviewed. Constitutional: She is oriented to person, place, and time. She appears well-developed and well-nourished. No distress.  HENT:  Head: Normocephalic and atraumatic.  Angio edema of tongue. Uvula completely visualized. Fullness in sublingual area. Voice is slightly mulffled   Eyes: Conjunctivae and EOM are normal.  Neck: Neck supple. No tracheal deviation present.  Cardiovascular: Normal rate, regular rhythm and normal heart sounds.   Pulmonary/Chest: Effort normal. No respiratory distress.  Abdominal: Soft. Bowel sounds are normal. She exhibits no distension. There is no tenderness. There is no rebound and no guarding.  Musculoskeletal: Normal range of motion. She exhibits no edema.  No swelling in legs.   Neurological: She is alert and oriented to person, place, and time.  Skin:  Skin is warm and dry. No rash noted. No erythema.  No redness or rash anywhere  Psychiatric: She has a normal mood and affect. Her behavior is normal.    ED Course  Procedures (including critical care time) DIAGNOSTIC STUDIES: Oxygen Saturation is 99% on RA, normal by my interpretation.    COORDINATION OF CARE: 12:10 AM- Pt advised of plan for treatment which includes medication and pt agrees.  Labs Review Labs Reviewed - No data to display  Imaging Review No results found.    MDM   Final diagnoses:  Angioedema, initial encounter    Pt reexamined and has decreased swelling, she has normal phonation at 2 AM - she has no one to check on her at home - will extend observation until change of shift to ensure that she continues to improved.  As to the etiology of the swelling, I am unsure, she has not had nsaids again and she has no other known allergy or exposures.  Will refer back to her allergist for ongoing testing.  meds as below for home.  Recheck at 6:30 AM, swelling continues to improve, vital signs remained stable, patient can be discharged home with close followup with her allergist, she expresses her understanding.  Meds given in ED:  Medications  methylPREDNISolone sodium succinate (SOLU-MEDROL) 125 mg/2 mL injection 125 mg (125 mg Intravenous Given 02/10/14 0026)  famotidine (PEPCID) IVPB 20 mg (0 mg Intravenous Stopped 02/10/14 0059)  diphenhydrAMINE (BENADRYL) injection 25 mg (25 mg Intravenous Given 02/10/14 0026)    New Prescriptions   DIPHENHYDRAMINE (BENADRYL) 25 MG TABLET    Take 1 tablet (25 mg total) by mouth every 6 (six) hours as needed for itching (Rash).   FAMOTIDINE (PEPCID) 20 MG TABLET    Take 1 tablet (20 mg total) by mouth 2 (two) times daily.   PREDNISONE (DELTASONE) 20 MG TABLET    Take 2 tablets (40 mg total) by mouth daily.     I personally performed the services described in this documentation, which was scribed in my presence. The recorded  information has been reviewed and is accurate.     Courtney Roller, MD 02/10/14 [phone]

## 2014-02-10 NOTE — ED Notes (Signed)
Patient's tongue swelling as decreased. Speech is more clear than when patient arrived to ED. Patient states "I feel much better."

## 2014-02-10 NOTE — ED Notes (Signed)
Patient placed in gown and vitals taken at this time. Family at bedside.

## 2014-02-10 NOTE — ED Notes (Signed)
Patient has swelling of tongue at this time. No distress noted. No complaints of pain, difficulty breathing, or swallowing at this time. Patient able to speak in full sentences with no difficulty.

## 2014-02-10 NOTE — Discharge Instructions (Signed)
Prednisone once daily   Pepcid nightly  Benadryl as needed for swelling, itching or rash  Use the Epi Pen that you have for severe swelling / difficulty breathing / speaking or swallowing - if you use the Epi pen, you MUST call 911.

## 2014-02-10 NOTE — ED Notes (Signed)
Patient's friend stated she was leaving and would be back in the morning to transport patient home. Stated she wanted to leave her name and number to call if needed. Name is Samuel GermanyConnie Applegate, number is [phone].

## 2014-02-10 NOTE — ED Notes (Addendum)
Patient's tongue remains swollen but has not worsened. Patient states "I feel better and it feels like I am talking better." Patient's voice remains muffled at this time.

## 2015-11-06 ENCOUNTER — Other Ambulatory Visit (HOSPITAL_COMMUNITY): Payer: Self-pay | Admitting: Internal Medicine

## 2015-11-06 DIAGNOSIS — Z1231 Encounter for screening mammogram for malignant neoplasm of breast: Secondary | ICD-10-CM

## 2015-11-09 ENCOUNTER — Ambulatory Visit (HOSPITAL_COMMUNITY): Payer: Medicaid Other

## 2015-11-13 ENCOUNTER — Other Ambulatory Visit (HOSPITAL_COMMUNITY): Payer: Self-pay | Admitting: Internal Medicine

## 2015-11-13 DIAGNOSIS — Z780 Asymptomatic menopausal state: Secondary | ICD-10-CM

## 2015-11-22 ENCOUNTER — Ambulatory Visit (HOSPITAL_COMMUNITY)
Admission: RE | Admit: 2015-11-22 | Discharge: 2015-11-22 | Disposition: A | Discharge status: Home / Self Care | Payer: Medicare Other | Source: Ambulatory Visit | Attending: Internal Medicine | Admitting: Internal Medicine

## 2015-11-22 DIAGNOSIS — Z1231 Encounter for screening mammogram for malignant neoplasm of breast: Secondary | ICD-10-CM | POA: Insufficient documentation

## 2015-11-22 DIAGNOSIS — Z780 Asymptomatic menopausal state: Secondary | ICD-10-CM | POA: Diagnosis present

## 2015-11-22 DIAGNOSIS — M8580 Other specified disorders of bone density and structure, unspecified site: Secondary | ICD-10-CM | POA: Diagnosis not present

## 2016-10-16 DIAGNOSIS — H25812 Combined forms of age-related cataract, left eye: Secondary | ICD-10-CM | POA: Diagnosis not present

## 2016-10-16 DIAGNOSIS — Z961 Presence of intraocular lens: Secondary | ICD-10-CM | POA: Diagnosis not present

## 2016-10-16 DIAGNOSIS — H2512 Age-related nuclear cataract, left eye: Secondary | ICD-10-CM | POA: Diagnosis not present

## 2016-10-23 ENCOUNTER — Encounter: Payer: Self-pay | Admitting: Orthopaedic Surgery

## 2016-10-23 ENCOUNTER — Ambulatory Visit (INDEPENDENT_AMBULATORY_CARE_PROVIDER_SITE_OTHER): Payer: Medicare HMO

## 2016-10-23 ENCOUNTER — Ambulatory Visit (INDEPENDENT_AMBULATORY_CARE_PROVIDER_SITE_OTHER): Payer: Medicare HMO | Admitting: Orthopaedic Surgery

## 2016-10-23 VITALS — BP 112/65 | HR 87 | Temp 97.5°F | Ht 64.0 in | Wt 151.0 lb

## 2016-10-23 DIAGNOSIS — H04123 Dry eye syndrome of bilateral lacrimal glands: Secondary | ICD-10-CM | POA: Diagnosis not present

## 2016-10-23 DIAGNOSIS — F17210 Nicotine dependence, cigarettes, uncomplicated: Secondary | ICD-10-CM | POA: Diagnosis not present

## 2016-10-23 DIAGNOSIS — M25522 Pain in left elbow: Secondary | ICD-10-CM | POA: Diagnosis not present

## 2016-10-23 DIAGNOSIS — M7712 Lateral epicondylitis, left elbow: Secondary | ICD-10-CM | POA: Diagnosis not present

## 2016-10-23 DIAGNOSIS — H40033 Anatomical narrow angle, bilateral: Secondary | ICD-10-CM | POA: Diagnosis not present

## 2016-10-23 NOTE — Progress Notes (Signed)
Subjective:    Patient ID: Courtney Gill, female    DOB: Jun 24, 1946, 71 y.o.   MRN: [id number]  HPI She has had pain in the left elbow area since before Christmas of last year.  She has no trauma. She has pain lifting objects, driving a car long distances.  She has been seen at Dr. Keane Police office and was told she had tendinitis of the elbow.  She has been on diclofenac with no help.  She has no swelling, no numbness.  It just bothers her most of the time.   Review of Systems  HENT: Negative for congestion.   Respiratory: Negative for cough and shortness of breath.   Cardiovascular: Negative for chest pain and leg swelling.  Endocrine: Negative for cold intolerance.  Musculoskeletal: Positive for arthralgias and joint swelling.  Allergic/Immunologic: Negative for environmental allergies.   Past Medical History:  Diagnosis Date  . Angioedema July 2014  . GERD (gastroesophageal reflux disease)   . Hypercholesterolemia   . Hypertension   . Peripheral artery disease [facility])     Past Surgical History:  Procedure Laterality Date  . CAROTID STENT Right 2007  . EYE SURGERY    . TONSILLECTOMY      Current Outpatient Prescriptions on File Prior to Visit  Medication Sig Dispense Refill  . clopidogrel (PLAVIX) 75 MG tablet Take 1 tablet (75 mg total) by mouth daily with breakfast. 30 tablet 3  . diphenhydrAMINE (BENADRYL) 25 MG tablet Take 1 tablet (25 mg total) by mouth every 6 (six) hours as needed for itching (Rash). 30 tablet 1  . diphenhydrAMINE (BENADRYL) 50 MG capsule Take 1 capsule (50 mg total) by mouth every 6 (six) hours as needed for itching or allergies. 30 capsule 0  . EPINEPHrine (EPI-PEN) 0.3 mg/0.3 mL SOAJ Inject 0.3 mLs (0.3 mg total) into the muscle once. 1 Device 3  . famotidine (PEPCID) 20 MG tablet Take 1 tablet (20 mg total) by mouth 2 (two) times daily. 60 tablet 3  . famotidine (PEPCID) 20 MG tablet Take 1 tablet (20 mg total) by mouth 2 (two) times daily. 30  tablet 0  . pantoprazole (PROTONIX) 40 MG tablet Take 40 mg by mouth daily.    . predniSONE (DELTASONE) 10 MG tablet 2 tabs for 2 days, then 1 tab for 3 days and stop 7 tablet 0  . predniSONE (DELTASONE) 20 MG tablet Take 2 tablets (40 mg total) by mouth daily. 10 tablet 0   No current facility-administered medications on file prior to visit.     Social History   Social History  . Marital status: Divorced    Spouse name: N/A  . Number of children: N/A  . Years of education: N/A   Occupational History  . Not on file.   Social History Main Topics  . Smoking status: Current Every Day Smoker  . Smokeless tobacco: Never Used  . Alcohol use No  . Drug use: No  . Sexual activity: Not on file   Other Topics Concern  . Not on file   Social History Narrative  . No narrative on file    No family history on file.  BP 112/65   Pulse 87   Temp 97.5 F (36.4 C)   Ht 5\' 4"  (1.626 m)   Wt 151 lb (68.5 kg)   BMI 25.92 kg/m       Objective:   Physical Exam  Constitutional: She is oriented to person, place, and time. She appears  well-developed and well-nourished.  HENT:  Head: Normocephalic and atraumatic.  Eyes: Conjunctivae and EOM are normal. Pupils are equal, round, and reactive to light.  Neck: Normal range of motion. Neck supple.  Cardiovascular: Normal rate, regular rhythm and intact distal pulses.   Pulmonary/Chest: Effort normal.  Abdominal: Soft.  Musculoskeletal: She exhibits tenderness (She has tenderness over the left lateral epicondyle.  she has pain to resisted extension. There is no swelling or redness.  NV intact.  She lacks about 5 degrees of full extension.  Right arm negative.).  Neurological: She is alert and oriented to person, place, and time. She displays normal reflexes. No cranial nerve deficit. She exhibits normal muscle tone. Coordination normal.  Skin: Skin is warm and dry.  Psychiatric: She has a normal mood and affect. Her behavior is normal.  Judgment and thought content normal.  Vitals reviewed.   X-rays were done of the left elbow, reported separately.      Assessment & Plan:   Encounter Diagnoses  Name Primary?  . Lateral epicondylitis of left elbow   . Left elbow pain Yes  . Cigarette nicotine dependence without complication    I have told her to do ice massage and explained this to her.  Procedure note:  After permission from the patient and after sterile prep, the area around the lateral epicondyle of the left elbow was injected with 1% Xylocaine and 1 cc DepoMedrol 40 tolerated well.  Return in two weeks.  She smokes and is "sorta" willing to stop.  Call if any problem.  Precautions discussed.  She can stop the diclofenac as she says it does nothing for her.  Electronically Signed Darreld McleanWayne Maxwell Lemen, MD 3/22/20183:48 PM

## 2016-10-23 NOTE — Patient Instructions (Signed)
Steps to Quit Smoking Smoking tobacco can be bad for your health. It can also affect almost every organ in your body. Smoking puts you and people around you at risk for many serious long-lasting (chronic) diseases. Quitting smoking is hard, but it is one of the best things that you can do for your health. It is never too late to quit. What are the benefits of quitting smoking? When you quit smoking, you lower your risk for getting serious diseases and conditions. They can include:  Lung cancer or lung disease.  Heart disease.  Stroke.  Heart attack.  Not being able to have children (infertility).  Weak bones (osteoporosis) and broken bones (fractures). If you have coughing, wheezing, and shortness of breath, those symptoms may get better when you quit. You may also get sick less often. If you are pregnant, quitting smoking can help to lower your chances of having a baby of low birth weight. What can I do to help me quit smoking? Talk with your doctor about what can help you quit smoking. Some things you can do (strategies) include:  Quitting smoking totally, instead of slowly cutting back how much you smoke over a period of time.  Going to in-person counseling. You are more likely to quit if you go to many counseling sessions.  Using resources and support systems, such as:  Online chats with a counselor.  Phone quitlines.  Printed self-help materials.  Support groups or group counseling.  Text messaging programs.  Mobile phone apps or applications.  Taking medicines. Some of these medicines may have nicotine in them. If you are pregnant or breastfeeding, do not take any medicines to quit smoking unless your doctor says it is okay. Talk with your doctor about counseling or other things that can help you. Talk with your doctor about using more than one strategy at the same time, such as taking medicines while you are also going to in-person counseling. This can help make quitting  easier. What things can I do to make it easier to quit? Quitting smoking might feel very hard at first, but there is a lot that you can do to make it easier. Take these steps:  Talk to your family and friends. Ask them to support and encourage you.  Call phone quitlines, reach out to support groups, or work with a counselor.  Ask people who smoke to not smoke around you.  Avoid places that make you want (trigger) to smoke, such as:  Bars.  Parties.  Smoke-break areas at work.  Spend time with people who do not smoke.  Lower the stress in your life. Stress can make you want to smoke. Try these things to help your stress:  Getting regular exercise.  Deep-breathing exercises.  Yoga.  Meditating.  Doing a body scan. To do this, close your eyes, focus on one area of your body at a time from head to toe, and notice which parts of your body are tense. Try to relax the muscles in those areas.  Download or buy apps on your mobile phone or tablet that can help you stick to your quit plan. There are many free apps, such as QuitGuide from the CDC (Centers for Disease Control and Prevention). You can find more support from smokefree.gov and other websites. This information is not intended to replace advice given to you by your health care provider. Make sure you discuss any questions you have with your health care provider. Document Released: 05/17/2009 Document Revised: 03/18/2016 Document   Reviewed: 12/05/2014 Elsevier Interactive Patient Education  2017 [employer]  

## 2016-10-28 DIAGNOSIS — H25811 Combined forms of age-related cataract, right eye: Secondary | ICD-10-CM | POA: Diagnosis not present

## 2016-10-28 DIAGNOSIS — Z961 Presence of intraocular lens: Secondary | ICD-10-CM | POA: Diagnosis not present

## 2016-11-06 ENCOUNTER — Ambulatory Visit (INDEPENDENT_AMBULATORY_CARE_PROVIDER_SITE_OTHER): Payer: Medicare HMO | Admitting: Orthopaedic Surgery

## 2016-11-06 ENCOUNTER — Encounter: Payer: Self-pay | Admitting: Orthopaedic Surgery

## 2016-11-06 VITALS — BP 119/74 | HR 76 | Temp 97.7°F | Ht 64.0 in | Wt 149.0 lb

## 2016-11-06 DIAGNOSIS — M7712 Lateral epicondylitis, left elbow: Secondary | ICD-10-CM

## 2016-11-06 NOTE — Patient Instructions (Signed)
Steps to Quit Smoking Smoking tobacco can be bad for your health. It can also affect almost every organ in your body. Smoking puts you and people around you at risk for many serious long-lasting (chronic) diseases. Quitting smoking is hard, but it is one of the best things that you can do for your health. It is never too late to quit. What are the benefits of quitting smoking? When you quit smoking, you lower your risk for getting serious diseases and conditions. They can include:  Lung cancer or lung disease.  Heart disease.  Stroke.  Heart attack.  Not being able to have children (infertility).  Weak bones (osteoporosis) and broken bones (fractures). If you have coughing, wheezing, and shortness of breath, those symptoms may get better when you quit. You may also get sick less often. If you are pregnant, quitting smoking can help to lower your chances of having a baby of low birth weight. What can I do to help me quit smoking? Talk with your doctor about what can help you quit smoking. Some things you can do (strategies) include:  Quitting smoking totally, instead of slowly cutting back how much you smoke over a period of time.  Going to in-person counseling. You are more likely to quit if you go to many counseling sessions.  Using resources and support systems, such as:  Online chats with a counselor.  Phone quitlines.  Printed self-help materials.  Support groups or group counseling.  Text messaging programs.  Mobile phone apps or applications.  Taking medicines. Some of these medicines may have nicotine in them. If you are pregnant or breastfeeding, do not take any medicines to quit smoking unless your doctor says it is okay. Talk with your doctor about counseling or other things that can help you. Talk with your doctor about using more than one strategy at the same time, such as taking medicines while you are also going to in-person counseling. This can help make quitting  easier. What things can I do to make it easier to quit? Quitting smoking might feel very hard at first, but there is a lot that you can do to make it easier. Take these steps:  Talk to your family and friends. Ask them to support and encourage you.  Call phone quitlines, reach out to support groups, or work with a counselor.  Ask people who smoke to not smoke around you.  Avoid places that make you want (trigger) to smoke, such as:  Bars.  Parties.  Smoke-break areas at work.  Spend time with people who do not smoke.  Lower the stress in your life. Stress can make you want to smoke. Try these things to help your stress:  Getting regular exercise.  Deep-breathing exercises.  Yoga.  Meditating.  Doing a body scan. To do this, close your eyes, focus on one area of your body at a time from head to toe, and notice which parts of your body are tense. Try to relax the muscles in those areas.  Download or buy apps on your mobile phone or tablet that can help you stick to your quit plan. There are many free apps, such as QuitGuide from the CDC (Centers for Disease Control and Prevention). You can find more support from smokefree.gov and other websites. This information is not intended to replace advice given to you by your health care provider. Make sure you discuss any questions you have with your health care provider. Document Released: 05/17/2009 Document Revised: 03/18/2016 Document   Reviewed: 12/05/2014 Elsevier Interactive Patient Education  2017 [employer]  

## 2016-11-06 NOTE — Progress Notes (Signed)
Patient Courtney Gill, female DOB:[birth year], 71 y.o. OZH:[id number]  Chief Complaint  Patient presents with  . Follow-up    left elbow    HPI  Courtney Gill is a 71 y.o. female who has had tennis elbow on the left.  She is much improved today.  She has little pain and full painless motion.  She is pleased. HPI  Body mass index is 25.58 kg/m.  ROS  Review of Systems  HENT: Negative for congestion.   Respiratory: Negative for cough and shortness of breath.   Cardiovascular: Negative for chest pain and leg swelling.  Endocrine: Negative for cold intolerance.  Musculoskeletal: Positive for arthralgias and joint swelling.  Allergic/Immunologic: Negative for environmental allergies.    Past Medical History:  Diagnosis Date  . Angioedema July 2014  . GERD (gastroesophageal reflux disease)   . Hypercholesterolemia   . Hypertension   . Peripheral artery disease [facility])     Past Surgical History:  Procedure Laterality Date  . CAROTID STENT Right 2007  . EYE SURGERY    . TONSILLECTOMY      No family history on file.  Social History Social History  Substance Use Topics  . Smoking status: Current Every Day Smoker  . Smokeless tobacco: Never Used  . Alcohol use No    Allergies  Allergen Reactions  . Aspirin Swelling    Current Outpatient Prescriptions  Medication Sig Dispense Refill  . clopidogrel (PLAVIX) 75 MG tablet Take 1 tablet (75 mg total) by mouth daily with breakfast. 30 tablet 3  . diphenhydrAMINE (BENADRYL) 25 MG tablet Take 1 tablet (25 mg total) by mouth every 6 (six) hours as needed for itching (Rash). 30 tablet 1  . diphenhydrAMINE (BENADRYL) 50 MG capsule Take 1 capsule (50 mg total) by mouth every 6 (six) hours as needed for itching or allergies. 30 capsule 0  . EPINEPHrine (EPI-PEN) 0.3 mg/0.3 mL SOAJ Inject 0.3 mLs (0.3 mg total) into the muscle once. 1 Device 3  . famotidine (PEPCID) 20 MG tablet Take 1 tablet (20 mg total) by mouth 2 (two) times  daily. 60 tablet 3  . famotidine (PEPCID) 20 MG tablet Take 1 tablet (20 mg total) by mouth 2 (two) times daily. 30 tablet 0  . pantoprazole (PROTONIX) 40 MG tablet Take 40 mg by mouth daily.    . predniSONE (DELTASONE) 10 MG tablet 2 tabs for 2 days, then 1 tab for 3 days and stop 7 tablet 0  . predniSONE (DELTASONE) 20 MG tablet Take 2 tablets (40 mg total) by mouth daily. 10 tablet 0   No current facility-administered medications for this visit.      Physical Exam  Blood pressure 119/74, pulse 76, temperature 97.7 F (36.5 C), height  (1.626 m), weight 149 lb (67.6 kg).  Constitutional: overall normal hygiene, normal nutrition, well developed, normal grooming, normal body habitus. Assistive device:none  Musculoskeletal: gait and station Limp none, muscle tone and strength are normal, no tremors or atrophy is present.  .  Neurological: coordination overall normal.  Deep tendon reflex/nerve stretch intact.  Sensation normal.  Cranial nerves II-XII intact.   Skin:   Normal overall no scars, lesions, ulcers or rashes. No psoriasis.  Psychiatric: Alert and oriented x 3.  Recent memory intact, remote memory unclear.  Normal mood and affect. Well groomed.  Good eye contact.  Cardiovascular: overall no swelling, no varicosities, no edema bilaterally, normal temperatures of the legs and arms, no clubbing, cyanosis and good capillary refill.  Lymphatic: palpation is normal.  She has full ROM of the left elbow and no pain.  NV is intact.  The patient has been educated about the nature of the problem(s) and counseled on treatment options.  The patient appeared to understand what I have discussed and is in agreement with it.  Encounter Diagnosis  Name Primary?  . Lateral epicondylitis of left elbow Yes    PLAN Call if any problems.  Precautions discussed.  Continue current medications.   Return to clinic PRN   Electronically Signed Darreld Mclean, MD 4/5/20182:12 PM

## 2016-11-21 DIAGNOSIS — E785 Hyperlipidemia, unspecified: Secondary | ICD-10-CM | POA: Diagnosis not present

## 2016-11-26 DIAGNOSIS — M65331 Trigger finger, right middle finger: Secondary | ICD-10-CM | POA: Diagnosis not present

## 2016-11-26 DIAGNOSIS — I10 Essential (primary) hypertension: Secondary | ICD-10-CM | POA: Diagnosis not present

## 2016-11-26 DIAGNOSIS — E782 Mixed hyperlipidemia: Secondary | ICD-10-CM | POA: Diagnosis not present

## 2016-11-26 DIAGNOSIS — K210 Gastro-esophageal reflux disease with esophagitis: Secondary | ICD-10-CM | POA: Diagnosis not present

## 2016-11-26 DIAGNOSIS — F17200 Nicotine dependence, unspecified, uncomplicated: Secondary | ICD-10-CM | POA: Diagnosis not present

## 2016-11-26 DIAGNOSIS — Z6825 Body mass index (BMI) 25.0-25.9, adult: Secondary | ICD-10-CM | POA: Diagnosis not present

## 2016-11-27 ENCOUNTER — Other Ambulatory Visit (HOSPITAL_COMMUNITY): Payer: Self-pay | Admitting: Internal Medicine

## 2016-11-27 DIAGNOSIS — F17200 Nicotine dependence, unspecified, uncomplicated: Secondary | ICD-10-CM

## 2016-12-12 ENCOUNTER — Ambulatory Visit (HOSPITAL_COMMUNITY)
Admission: RE | Admit: 2016-12-12 | Discharge: 2016-12-12 | Disposition: A | Discharge status: Home / Self Care | Payer: Medicare HMO | Source: Ambulatory Visit | Attending: Internal Medicine | Admitting: Internal Medicine

## 2016-12-12 DIAGNOSIS — F17210 Nicotine dependence, cigarettes, uncomplicated: Secondary | ICD-10-CM | POA: Diagnosis not present

## 2016-12-12 DIAGNOSIS — Z87891 Personal history of nicotine dependence: Secondary | ICD-10-CM | POA: Insufficient documentation

## 2016-12-12 DIAGNOSIS — I700 Atherosclerosis of aorta: Secondary | ICD-10-CM | POA: Insufficient documentation

## 2016-12-12 DIAGNOSIS — I2510 Atherosclerotic heart disease of native coronary artery without angina pectoris: Secondary | ICD-10-CM | POA: Insufficient documentation

## 2016-12-12 DIAGNOSIS — F17200 Nicotine dependence, unspecified, uncomplicated: Secondary | ICD-10-CM

## 2017-03-24 DIAGNOSIS — Z6823 Body mass index (BMI) 23.0-23.9, adult: Secondary | ICD-10-CM | POA: Diagnosis not present

## 2017-03-24 DIAGNOSIS — R05 Cough: Secondary | ICD-10-CM | POA: Diagnosis not present

## 2017-03-24 DIAGNOSIS — J069 Acute upper respiratory infection, unspecified: Secondary | ICD-10-CM | POA: Diagnosis not present

## 2017-05-27 DIAGNOSIS — I10 Essential (primary) hypertension: Secondary | ICD-10-CM | POA: Diagnosis not present

## 2017-05-29 DIAGNOSIS — K219 Gastro-esophageal reflux disease without esophagitis: Secondary | ICD-10-CM | POA: Diagnosis not present

## 2017-05-29 DIAGNOSIS — E785 Hyperlipidemia, unspecified: Secondary | ICD-10-CM | POA: Diagnosis not present

## 2017-05-29 DIAGNOSIS — I2510 Atherosclerotic heart disease of native coronary artery without angina pectoris: Secondary | ICD-10-CM | POA: Diagnosis not present

## 2017-05-29 DIAGNOSIS — M65331 Trigger finger, right middle finger: Secondary | ICD-10-CM | POA: Diagnosis not present

## 2017-05-29 DIAGNOSIS — F17200 Nicotine dependence, unspecified, uncomplicated: Secondary | ICD-10-CM | POA: Diagnosis not present

## 2017-06-02 ENCOUNTER — Other Ambulatory Visit (HOSPITAL_COMMUNITY): Payer: Self-pay | Admitting: Internal Medicine

## 2017-06-02 DIAGNOSIS — Z1231 Encounter for screening mammogram for malignant neoplasm of breast: Secondary | ICD-10-CM

## 2017-06-11 ENCOUNTER — Ambulatory Visit (HOSPITAL_COMMUNITY)
Admission: RE | Admit: 2017-06-11 | Discharge: 2017-06-11 | Disposition: A | Discharge status: Home / Self Care | Payer: Medicare HMO | Source: Ambulatory Visit | Attending: Internal Medicine | Admitting: Internal Medicine

## 2017-06-11 ENCOUNTER — Encounter (HOSPITAL_COMMUNITY): Payer: Self-pay

## 2017-06-11 DIAGNOSIS — Z1231 Encounter for screening mammogram for malignant neoplasm of breast: Secondary | ICD-10-CM | POA: Diagnosis not present

## 2017-11-25 DIAGNOSIS — K219 Gastro-esophageal reflux disease without esophagitis: Secondary | ICD-10-CM | POA: Diagnosis not present

## 2017-11-25 DIAGNOSIS — R5381 Other malaise: Secondary | ICD-10-CM | POA: Diagnosis not present

## 2017-11-25 DIAGNOSIS — M65331 Trigger finger, right middle finger: Secondary | ICD-10-CM | POA: Diagnosis not present

## 2017-11-25 DIAGNOSIS — I10 Essential (primary) hypertension: Secondary | ICD-10-CM | POA: Diagnosis not present

## 2017-11-25 DIAGNOSIS — R5383 Other fatigue: Secondary | ICD-10-CM | POA: Diagnosis not present

## 2017-11-25 DIAGNOSIS — I2510 Atherosclerotic heart disease of native coronary artery without angina pectoris: Secondary | ICD-10-CM | POA: Diagnosis not present

## 2017-11-25 DIAGNOSIS — E785 Hyperlipidemia, unspecified: Secondary | ICD-10-CM | POA: Diagnosis not present

## 2017-11-25 DIAGNOSIS — F17200 Nicotine dependence, unspecified, uncomplicated: Secondary | ICD-10-CM | POA: Diagnosis not present

## 2017-11-27 DIAGNOSIS — E782 Mixed hyperlipidemia: Secondary | ICD-10-CM | POA: Diagnosis not present

## 2017-11-27 DIAGNOSIS — K219 Gastro-esophageal reflux disease without esophagitis: Secondary | ICD-10-CM | POA: Diagnosis not present

## 2017-11-27 DIAGNOSIS — R5383 Other fatigue: Secondary | ICD-10-CM | POA: Diagnosis not present

## 2017-11-27 DIAGNOSIS — Z6825 Body mass index (BMI) 25.0-25.9, adult: Secondary | ICD-10-CM | POA: Diagnosis not present

## 2017-11-27 DIAGNOSIS — N183 Chronic kidney disease, stage 3 (moderate): Secondary | ICD-10-CM | POA: Diagnosis not present

## 2017-11-27 DIAGNOSIS — F17200 Nicotine dependence, unspecified, uncomplicated: Secondary | ICD-10-CM | POA: Diagnosis not present

## 2017-11-27 DIAGNOSIS — I2510 Atherosclerotic heart disease of native coronary artery without angina pectoris: Secondary | ICD-10-CM | POA: Diagnosis not present

## 2017-12-08 DIAGNOSIS — Z961 Presence of intraocular lens: Secondary | ICD-10-CM | POA: Diagnosis not present

## 2017-12-08 DIAGNOSIS — H25811 Combined forms of age-related cataract, right eye: Secondary | ICD-10-CM | POA: Diagnosis not present

## 2017-12-08 DIAGNOSIS — H02831 Dermatochalasis of right upper eyelid: Secondary | ICD-10-CM | POA: Diagnosis not present

## 2018-01-25 DIAGNOSIS — F17200 Nicotine dependence, unspecified, uncomplicated: Secondary | ICD-10-CM | POA: Diagnosis not present

## 2018-01-25 DIAGNOSIS — E782 Mixed hyperlipidemia: Secondary | ICD-10-CM | POA: Diagnosis not present

## 2018-01-25 DIAGNOSIS — M65331 Trigger finger, right middle finger: Secondary | ICD-10-CM | POA: Diagnosis not present

## 2018-01-25 DIAGNOSIS — Z6824 Body mass index (BMI) 24.0-24.9, adult: Secondary | ICD-10-CM | POA: Diagnosis not present

## 2018-01-25 DIAGNOSIS — J060 Acute laryngopharyngitis: Secondary | ICD-10-CM | POA: Diagnosis not present

## 2018-01-25 DIAGNOSIS — I2510 Atherosclerotic heart disease of native coronary artery without angina pectoris: Secondary | ICD-10-CM | POA: Diagnosis not present

## 2018-01-25 DIAGNOSIS — N183 Chronic kidney disease, stage 3 (moderate): Secondary | ICD-10-CM | POA: Diagnosis not present

## 2018-01-25 DIAGNOSIS — K219 Gastro-esophageal reflux disease without esophagitis: Secondary | ICD-10-CM | POA: Diagnosis not present

## 2018-01-25 DIAGNOSIS — E785 Hyperlipidemia, unspecified: Secondary | ICD-10-CM | POA: Diagnosis not present

## 2018-04-14 ENCOUNTER — Institutional Professional Consult (permissible substitution): Payer: Medicare HMO | Admitting: Neurology

## 2018-06-01 ENCOUNTER — Encounter: Payer: Self-pay | Admitting: Neurology

## 2018-06-02 ENCOUNTER — Ambulatory Visit (INDEPENDENT_AMBULATORY_CARE_PROVIDER_SITE_OTHER): Payer: Medicare HMO | Admitting: Neurology

## 2018-06-02 ENCOUNTER — Encounter: Payer: Self-pay | Admitting: Neurology

## 2018-06-02 VITALS — BP 158/84 | HR 73 | Ht 64.0 in | Wt 147.5 lb

## 2018-06-02 DIAGNOSIS — I739 Peripheral vascular disease, unspecified: Secondary | ICD-10-CM

## 2018-06-02 DIAGNOSIS — F5104 Psychophysiologic insomnia: Secondary | ICD-10-CM | POA: Diagnosis not present

## 2018-06-02 DIAGNOSIS — O2230 Deep phlebothrombosis in pregnancy, unspecified trimester: Secondary | ICD-10-CM

## 2018-06-02 DIAGNOSIS — R0683 Snoring: Secondary | ICD-10-CM | POA: Diagnosis not present

## 2018-06-02 DIAGNOSIS — G4752 REM sleep behavior disorder: Secondary | ICD-10-CM | POA: Diagnosis not present

## 2018-06-02 MED ORDER — TRAZODONE HCL 50 MG PO TABS: 25.0000 mg | ORAL_TABLET | Freq: Every day | ORAL | 1 refills | Status: DC

## 2018-06-02 NOTE — Addendum Note (Signed)
Addended by: Melvyn Novas on: 06/02/2018 12:42 PM   Modules accepted: Orders

## 2018-06-02 NOTE — Progress Notes (Signed)
SLEEP MEDICINE CLINIC   Provider:  Melvyn Novas, MD   Primary Care Physician:  Benita Stabile, MD   Referring Provider: Benita Stabile, MD   Chief Complaint  Patient presents with  . New Patient (Initial Visit)    Rm 10, alone  . Referred by Roe Rutherford, NP    Fatigue,not sleeping well. no prior SS    HPI: Courtney Gill is a 72 y.o. female patient. She is seen here in 06-02-2018  referral  from Dr. [name] for an insomnia evaluation.   Chief complaint according to patient : Mu husband says I snore loudly and I fight in my sleep" " I am not waking up from it" and " I have trouble falling asleep, more than staying asleep" .   Sleep habits are as follows: [name] reports that she is not the clock in the house and dinnertime is whenever her husband has a food ready.  This can vary between 5 PM and 8 PM.  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  She is going to bed around 9 PM leaving  her husband watching the TV- not  in the bedroom.  She describes her bedroom as cool and dark and she runs a box fan or ceiling fan for white noise.  She has some nights difficulties to initiate sleep hours she does not and is not always clear why.  She is not sure if she stops snoring immediately but her husband has complained about the volume of her snoring.  She loves to sleep prone, one soft pillow only is in use- on a flat bed. Between 4 and 5 am ( after 6 hours of sleep )  she will wake up for nocturia, and some mornings she is not able to return to sleep. She does not wake up because of headaches, she is not in discomfort, shortness of breath, nausea or dizziness. Never feeling refreshed or restored- not for years!  If she can go back to sleep she could sleep until noon!.  She takes no longer daytime naps as she used to while working.    Sleep medical history and family sleep history:    Social  history: second wife of husband Onalee Hua -has  one son from her first marriage. ( no biological grandchildren)  and 4 living step children, 17 grandchildren , 71 great grand children.  Still smoking 1 PPD and since age 31 . [place]. ETOH none-  Caffeine - 2 coffee - sweet tea 2 a week, [place] iced tea very day. .  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 Epworth score 5/ 24  , Fatigue severity score n/a   , geriatric depression score 1/ 15   Social History   Socioeconomic History  . Marital status: Divorced    Spouse name: Not on file  . Number of children: Not on file  . Years of education: Not on file  . Highest education level: Not on file  Occupational History  . Not on file  Social Needs  . Financial resource strain: Not on file  .  Food insecurity:    Worry: Not on file    Inability: Not on file  . Transportation needs:    Medical: Not on file    Non-medical: Not on file  Tobacco Use  . Smoking status: Current Every Day Smoker    Packs/day: 1.00    Types: Cigarettes  . Smokeless tobacco: Never Used  Substance and Sexual Activity  . Alcohol use: No  . Drug use: No  . Sexual activity: Not on file  Lifestyle  . Physical activity:    Days per week: Not on file    Minutes per session: Not on file  . Stress: Not on file  Relationships  . Social connections:    Talks on phone: Not on file    Gets together: Not on file    Attends religious service: Not on file    Active member of club or organization: Not on file    Attends meetings of clubs or organizations: Not on file    Relationship status: Not on file  . Intimate partner violence:    Fear of current or ex partner: Not on file     Emotionally abused: Not on file    Physically abused: Not on file    Forced sexual activity: Not on file  Other Topics Concern  . Not on file  Social History Narrative   Lives home with husband, drinks caffeine 2 cups daily.  Education HS.  Children 1 son, 4 stepchildren.    Family History  Problem Relation Age of Onset  . Atrial fibrillation Mother   . Diabetes Maternal Grandfather   . Cancer Paternal Grandfather     Past Medical History:  Diagnosis Date  . Angioedema July 2014  . GERD (gastroesophageal reflux disease)   . Hypercholesterolemia   . Hypertension   . Migraine   . Peripheral artery disease [facility])     Past Surgical History:  Procedure Laterality Date  . CAROTID STENT Right 2007  . EYE SURGERY    . TONSILLECTOMY      Current Outpatient Medications  Medication Sig Dispense Refill  . Biotin 1000 MCG tablet Take 1,000 mcg by mouth daily.    . Cholecalciferol (VITAMIN D PO) Take 5,000 Units by mouth daily.    . clopidogrel (PLAVIX) 75 MG tablet Take 1 tablet (75 mg total) by mouth daily with breakfast. 30 tablet 3  . diphenhydrAMINE (BENADRYL) 50 MG capsule Take 1 capsule (50 mg total) by mouth every 6 (six) hours as needed for itching or allergies. 30 capsule 0  . EPINEPHrine (EPI-PEN) 0.3 mg/0.3 mL SOAJ Inject 0.3 mLs (0.3 mg total) into the muscle once. 1 Device 3  . ibuprofen (ADVIL,MOTRIN) 200 MG tablet Take 200 mg by mouth every 6 (six) hours as needed.    . Omega-3 Fatty Acids (OMEGA-3 FISH OIL PO) Take by mouth.    . pantoprazole (PROTONIX) 40 MG tablet Take 40 mg by mouth daily.    . rosuvastatin (CRESTOR) 10 MG tablet     . traMADol (ULTRAM) 50 MG tablet Take by mouth every 6 (six) hours as needed.    . vitamin E 400 UNIT capsule Take 400 Units by mouth daily.     No current facility-administered medications for this visit.     Allergies as of 06/02/2018 - Review Complete 06/02/2018  Allergen Reaction Noted  . Aspirin Swelling 02/28/2013     Vitals: BP (!) 158/84   Pulse 73   Ht 5\' 4"  (1.626 m)  Wt 147 lb 8 oz (66.9 kg)   BMI 25.32 kg/m  Last Weight:  Wt Readings from Last 1 Encounters:  06/02/18 147 lb 8 oz (66.9 kg)   ONG:EXBM mass index is 25.32 kg/m.     Last Height:   Ht Readings from Last 1 Encounters:  06/02/18 5\' 4"  (1.626 m)    Physical exam:  General: The patient is awake, alert and appears not in acute distress. The patient is well groomed. Head: Normocephalic, atraumatic. Neck is supple. Mallampati 5,   neck circumference:15 . Nasal airflow patent .  TMJ is evident . Retrognathia is seen.  Cardiovascular:  Regular rate and rhythm , without  murmurs or carotid bruit, and without distended neck veins. Respiratory: Lungs are clear to auscultation. Skin:  Without evidence of edema, or rash Trunk: BMI is 25. 3. The patient's posture is slightly stooped.  Neurologic exam : The patient is awake and alert, oriented to place and time.   Attention span & concentration ability appears normal.  Speech is fluent,  without dysarthria, mild dysphonia or aphasia.  Mood and affect are appropriate.  Cranial nerves: Pupils are equal and briskly reactive to light.  Funduscopic exam without evidence of pallor or edema. Extraocular movements  in vertical and horizontal planes intact and without nystagmus. Visual fields by finger perimetry are intact. Hearing to finger rub intact.  Facial sensation intact to fine touch. Facial motor strength is symmetric and tongue and uvula move midline. Shoulder shrug was symmetrical.   Motor exam:   Normal tone, muscle bulk and symmetric strength in all extremities. 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  Sensory:  Fine touch, pinprick and vibration were tested in all extremities. Proprioception tested in the upper extremities was normal. Coordination: Rapid alternating movements in the fingers/hands was normal. Finger-to-nose  maneuver  normal without evidence of ataxia, dysmetria or tremor. Gait and station: Patient walks without assistive device and is able unassisted to climb up to the exam table. Strength within normal limits. Stance is stable and normal.Tandem gait is unfragmented. Turns with 3 Steps, fluent movements without tremor, drift and with normal arm swing. Romberg testing is negative. Deep tendon reflexes: in the upper and lower extremities are symmetric and intact. Babinski maneuver response is downgoing.    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 NO PD  associated findings- normal facial expression, normal blink, normal arm swing and balance.Plan: Parasomnia montage .  2)  No findings of COPD, but loud snoring reported, check for apnea.  Plan: PSG ordered, oximetry is imporatant .   3) I am equally intrigued by her report of insomnia - she actually sleeps most night 6 hours or more and she is fatigued when waking up - I will offer her low dose  trazodone prn.  Gave her sleep hygiene instructions.    The patient was advised of the nature of the diagnosed disorder , the treatment options and the  risks for general health and wellness arising from not treating the condition.   I spent more than  50  minutes of face to face time with the patient.  Greater than 50% of time was spent in counseling and coordination of care. We have discussed the diagnosis and differential and I answered the patient's questions.     Melvyn Novas, MD 06/02/2018, 12:03 PM  Certified in Neurology by ABPN Certified in Sleep Medicine by  [facility] [address], [address] [place], [place] [address]

## 2018-06-08 DIAGNOSIS — E785 Hyperlipidemia, unspecified: Secondary | ICD-10-CM | POA: Diagnosis not present

## 2018-06-08 DIAGNOSIS — E782 Mixed hyperlipidemia: Secondary | ICD-10-CM | POA: Diagnosis not present

## 2018-06-08 DIAGNOSIS — N183 Chronic kidney disease, stage 3 (moderate): Secondary | ICD-10-CM | POA: Diagnosis not present

## 2018-06-11 DIAGNOSIS — I2510 Atherosclerotic heart disease of native coronary artery without angina pectoris: Secondary | ICD-10-CM | POA: Diagnosis not present

## 2018-06-11 DIAGNOSIS — F17200 Nicotine dependence, unspecified, uncomplicated: Secondary | ICD-10-CM | POA: Diagnosis not present

## 2018-06-11 DIAGNOSIS — Z0000 Encounter for general adult medical examination without abnormal findings: Secondary | ICD-10-CM | POA: Diagnosis not present

## 2018-06-11 DIAGNOSIS — N182 Chronic kidney disease, stage 2 (mild): Secondary | ICD-10-CM | POA: Diagnosis not present

## 2018-06-11 DIAGNOSIS — G4700 Insomnia, unspecified: Secondary | ICD-10-CM | POA: Diagnosis not present

## 2018-06-11 DIAGNOSIS — E782 Mixed hyperlipidemia: Secondary | ICD-10-CM | POA: Diagnosis not present

## 2018-06-11 DIAGNOSIS — K219 Gastro-esophageal reflux disease without esophagitis: Secondary | ICD-10-CM | POA: Diagnosis not present

## 2018-06-11 DIAGNOSIS — Z23 Encounter for immunization: Secondary | ICD-10-CM | POA: Diagnosis not present

## 2018-06-11 DIAGNOSIS — H9190 Unspecified hearing loss, unspecified ear: Secondary | ICD-10-CM | POA: Diagnosis not present

## 2018-06-14 DIAGNOSIS — Z822 Family history of deafness and hearing loss: Secondary | ICD-10-CM | POA: Diagnosis not present

## 2018-06-14 DIAGNOSIS — Z77122 Contact with and (suspected) exposure to noise: Secondary | ICD-10-CM | POA: Diagnosis not present

## 2018-06-14 DIAGNOSIS — H9313 Tinnitus, bilateral: Secondary | ICD-10-CM | POA: Diagnosis not present

## 2018-06-14 DIAGNOSIS — H903 Sensorineural hearing loss, bilateral: Secondary | ICD-10-CM | POA: Diagnosis not present

## 2018-06-28 ENCOUNTER — Ambulatory Visit (INDEPENDENT_AMBULATORY_CARE_PROVIDER_SITE_OTHER): Payer: Medicare HMO | Admitting: Neurology

## 2018-06-28 DIAGNOSIS — I739 Peripheral vascular disease, unspecified: Secondary | ICD-10-CM

## 2018-06-28 DIAGNOSIS — G4752 REM sleep behavior disorder: Secondary | ICD-10-CM | POA: Diagnosis not present

## 2018-06-28 DIAGNOSIS — R0683 Snoring: Secondary | ICD-10-CM

## 2018-06-28 DIAGNOSIS — F5104 Psychophysiologic insomnia: Secondary | ICD-10-CM

## 2018-07-08 ENCOUNTER — Telehealth: Payer: Self-pay | Admitting: Neurology

## 2018-07-08 NOTE — Telephone Encounter (Signed)
Called patient to discuss sleep study results. No answer at this time. LVM for the patient to call back.   

## 2018-07-09 NOTE — Procedures (Signed)
PATIENT'S NAME:  Courtney Gill, Courtney Gill DOB:      May 04, 1946      MR#:    [id number]     DATE OF RECORDING: 06/28/2018 REFERRING M.D.:  Nita Sells, MD Study Performed:   Baseline Polysomnogram with REM parasomnia montage  HISTORY:  Courtney Gill is a 72 y.o. female patient. She is seen on 06-02-2018 upon referral from Dr. [name] for an insomnia evaluation.  Chief complaint according to patient: "My husband says I snore loudly and I fight in my sleep", and: "I am not waking up from snoring" and: " I have trouble falling asleep, more than staying asleep"  She reports having trouble to sleep uninterrupted through the night. She does not wake up because of headaches, she is not in discomfort, shortness of breath, nausea or dizziness. "Never feeling refreshed or restored- not for years" If she can go back to sleep she could sleep until noon! She takes no longer daytime naps as she used to do while working.   Epworth Sleepiness Score 5/ 24 points, geriatric depression score at 1/ 15 points.  The patient's weight 147 pounds with a height of 64 (inches), resulting in a BMI of 25.2 kg/m2. The patient's neck circumference measured 15 inches.  CURRENT MEDICATIONS: Biotin, Vitamin D, Plavix, Benadryl, Advil, Omega-3, Protonix, Crestor, Ultram, Vitamin E,   PROCEDURE:  This is a multichannel digital polysomnogram utilizing the Somnostar 11.2 system.  Electrodes and sensors were applied and monitored per AASM Specifications.   EEG, EOG, Chin and Limb EMG, were sampled at 200 Hz.  ECG, Snore and Nasal Pressure, Thermal Airflow, Respiratory Effort, CPAP Flow and Pressure, Oximetry was sampled at 50 Hz. Digital video and audio were recorded.      BASELINE STUDY: Lights Out was at 21:09 and Lights On at 04:59.  Total recording time (TRT) was 470.5 minutes, with a total sleep time (TST) of 368 minutes.  The patient's sleep latency was 29 minutes.  REM latency was 294 minutes.  The sleep efficiency was 78.2 %.     SLEEP  ARCHITECTURE: WASO (Wake after sleep onset) was 86 minutes.  There were 13 minutes in Stage N1, 211.5 minutes Stage N2, 94 minutes Stage N3 and 49.5 minutes in Stage REM.  The percentage of Stage N1 was 3.5%, Stage N2 was 57.5%, Stage N3 was 25.5% and Stage R (REM sleep) was 13.5%.  The arousals were noted as: 43 were spontaneous, 8 were associated with PLMs, and 13 were associated with respiratory events.  RESPIRATORY ANALYSIS:  There were a total of 25 respiratory events:  8 obstructive apneas, 4 central apneas and 13 hypopneas. The patient also had several respiratory event related arousals (RERAs). The total APNEA/HYPOPNEA INDEX (AHI) was 4.1/h, one event occurred in REM sleep and 28 events in NREM. The REM AHI was 2.4 /hour, versus a non-REM AHI of 4.3/h. The patient spent 30.5 minutes of total sleep time in the supine position and 338 minutes in non-supine. The supine AHI was 15.7/h versus a non-supine AHI of 3.0/h.  OXYGEN SATURATION & C02:  The Wake baseline 02 saturation was 92%, with the lowest being 85%. Time spent below 89% saturation equaled 86 minutes.    AROUSALS: The patient had a total of 18 Periodic Limb Movements.  The Periodic Limb Movement (PLM) index was 2.9 and the PLM Arousal index was 1.3/hour. The arousals were noted as: 43 were spontaneous, 8 were associated with PLMs, and 13 were associated with respiratory events.  There were  movements during REM sleep.  Audio and video analysis did not show any abnormal or behaviors, phonations or vocalizations.   The patient took bathroom breaks. Snoring was noted. EKG was in keeping with normal sinus rhythm (NSR). Post-study, the patient indicated that sleep was the same as usual.    IMPRESSION: 1. Obstructive Sleep Apnea(OSA) with AHI of 3.9/h, but clinically only significant in supine sleep AHI 15/h 2. 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  3. Low sleep  efficiency. 4. Snoring was loud, loudest during REM sleep.   RECOMMENDATIONS: 1.  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 Melatonin is first line agent to suppress REM BD. Klonopin is second treatment in line. REM BD is closely linked with parkinsonian symptoms.  2. Mild apnea can be addressed by avoiding supine sleep alone, no CPAP intervention needed.  3. Consider snoring treatment by dental device, if bothered by snoring.    I certify that I have reviewed the entire raw data recording prior to the issuance of this report in accordance with the Standards of Accreditation of the American Academy of Sleep Medicine (AASM)   Melvyn Novasarmen Lilburn Straw, MD   07-08-2018 Diplomat, American Board of Psychiatry and Neurology  Diplomat, American Board of Sleep Medicine Wellsite geologistMedical Director, [facility]  Cc Fidel LevyJohn Zack Hall, MD

## 2018-07-12 ENCOUNTER — Telehealth: Payer: Self-pay | Admitting: Neurology

## 2018-07-12 NOTE — Telephone Encounter (Signed)
-----   Message from Melvyn Novasarmen Dohmeier, MD sent at 07/09/2018  9:20 AM EST ----- There was no significant Sleep apnea found, but leg movements in REM ( feet), loud snoring.  There was prolonged hypoxemia ( Total Time in Low 02) but not continuously -Loud snoring may cause some sleep problems but would not be treated with CPAP. REM BD can be treated with melatonin as first step, if this fails use Plan B = 0.25 mg Klonopin.    Cc Dr Catalina PizzaZach Hall

## 2018-07-12 NOTE — Telephone Encounter (Signed)
Called patient to discuss sleep study results. No answer at this time. LVM for the patient to call back.   

## 2018-07-13 ENCOUNTER — Telehealth: Payer: Self-pay

## 2018-07-13 NOTE — Telephone Encounter (Signed)
Patient was returning a phone call about her sleep study results. She stated that she is home and you can call her back to discuss.

## 2018-07-14 NOTE — Telephone Encounter (Signed)
Called the patient on cell per her request. No answer. LVM informing that I discussed with Dr Vickey Hugerohmeier and she states that it is safe for her to take trazadone and melatonin together. Recommends that since she has not taken the trazadone she would recommend trying the melatonin first and seeing how well she tolerates that and if she finds she needs the trazadone to help fall asleep she can use that as well. LVM with all this information. Instructed her to call with any questions.   IF she calls back please advise above.

## 2018-07-14 NOTE — Telephone Encounter (Signed)
Called the patient and reviewed her sleep study. Informed her Dr Vickey Hugerohmeier read her study and states that most likely REM Behavior disorder is present due to the movement and activation of EMG channels during the patients REM sleep. Informed the patient that the first line of treatment is melatonin for help with treating this. I reviewed that her apnea for the most part was ok and only increased when she slept on her back. Encouraged the patient to prop pillow up behind her to help keep her off her back in her sleep and prevent the apnea. Reviewed the melatonin dosage of 5 mg or less and that its purchased OTC. Patient states that Dr Vickey Hugerohmeier had given a script for trazadone, she states that she has not used it due to not having to at this time. Patient asked should she avoid taking the melatonin and trazadone together if she felt the need to take it. I informed her that she should and that I would check with Dr Dohmeier to verify and make sure I was given correct information. Informed her that since the purpose was to help her fall asleep with both medications I would think trazadone would not be needed and it doesn't serve the purpose in helping treat REM BD. Patient is asking I call once I clarify with Dr Vickey Hugerohmeier. Advised her that I would call back with confirmation. Pt was appreciative for the call.

## 2018-11-09 DIAGNOSIS — N183 Chronic kidney disease, stage 3 (moderate): Secondary | ICD-10-CM | POA: Diagnosis not present

## 2018-11-09 DIAGNOSIS — K219 Gastro-esophageal reflux disease without esophagitis: Secondary | ICD-10-CM | POA: Diagnosis not present

## 2018-11-09 DIAGNOSIS — E782 Mixed hyperlipidemia: Secondary | ICD-10-CM | POA: Diagnosis not present

## 2018-11-09 DIAGNOSIS — G4700 Insomnia, unspecified: Secondary | ICD-10-CM | POA: Diagnosis not present

## 2018-11-09 DIAGNOSIS — E785 Hyperlipidemia, unspecified: Secondary | ICD-10-CM | POA: Diagnosis not present

## 2018-11-11 DIAGNOSIS — G4700 Insomnia, unspecified: Secondary | ICD-10-CM | POA: Diagnosis not present

## 2018-11-11 DIAGNOSIS — K219 Gastro-esophageal reflux disease without esophagitis: Secondary | ICD-10-CM | POA: Diagnosis not present

## 2018-11-11 DIAGNOSIS — N182 Chronic kidney disease, stage 2 (mild): Secondary | ICD-10-CM | POA: Diagnosis not present

## 2018-11-11 DIAGNOSIS — E782 Mixed hyperlipidemia: Secondary | ICD-10-CM | POA: Diagnosis not present

## 2018-11-11 DIAGNOSIS — I2510 Atherosclerotic heart disease of native coronary artery without angina pectoris: Secondary | ICD-10-CM | POA: Diagnosis not present

## 2018-11-11 DIAGNOSIS — F17210 Nicotine dependence, cigarettes, uncomplicated: Secondary | ICD-10-CM | POA: Diagnosis not present

## 2018-12-01 DIAGNOSIS — Z0000 Encounter for general adult medical examination without abnormal findings: Secondary | ICD-10-CM | POA: Diagnosis not present

## 2018-12-14 DIAGNOSIS — H25811 Combined forms of age-related cataract, right eye: Secondary | ICD-10-CM | POA: Diagnosis not present

## 2018-12-14 DIAGNOSIS — H02831 Dermatochalasis of right upper eyelid: Secondary | ICD-10-CM | POA: Diagnosis not present

## 2019-02-22 DIAGNOSIS — I2510 Atherosclerotic heart disease of native coronary artery without angina pectoris: Secondary | ICD-10-CM | POA: Diagnosis not present

## 2019-02-22 DIAGNOSIS — N183 Chronic kidney disease, stage 3 (moderate): Secondary | ICD-10-CM | POA: Diagnosis not present

## 2019-02-22 DIAGNOSIS — Z6824 Body mass index (BMI) 24.0-24.9, adult: Secondary | ICD-10-CM | POA: Diagnosis not present

## 2019-02-22 DIAGNOSIS — R109 Unspecified abdominal pain: Secondary | ICD-10-CM | POA: Diagnosis not present

## 2019-02-22 DIAGNOSIS — K219 Gastro-esophageal reflux disease without esophagitis: Secondary | ICD-10-CM | POA: Diagnosis not present

## 2019-02-22 DIAGNOSIS — E785 Hyperlipidemia, unspecified: Secondary | ICD-10-CM | POA: Diagnosis not present

## 2019-02-22 DIAGNOSIS — M65331 Trigger finger, right middle finger: Secondary | ICD-10-CM | POA: Diagnosis not present

## 2019-02-22 DIAGNOSIS — F17200 Nicotine dependence, unspecified, uncomplicated: Secondary | ICD-10-CM | POA: Diagnosis not present

## 2019-02-22 DIAGNOSIS — J060 Acute laryngopharyngitis: Secondary | ICD-10-CM | POA: Diagnosis not present

## 2019-02-22 DIAGNOSIS — Z0000 Encounter for general adult medical examination without abnormal findings: Secondary | ICD-10-CM | POA: Diagnosis not present

## 2019-05-11 DIAGNOSIS — E782 Mixed hyperlipidemia: Secondary | ICD-10-CM | POA: Diagnosis not present

## 2019-05-11 DIAGNOSIS — N183 Chronic kidney disease, stage 3 unspecified: Secondary | ICD-10-CM | POA: Diagnosis not present

## 2019-05-11 DIAGNOSIS — E785 Hyperlipidemia, unspecified: Secondary | ICD-10-CM | POA: Diagnosis not present

## 2019-05-17 DIAGNOSIS — N182 Chronic kidney disease, stage 2 (mild): Secondary | ICD-10-CM | POA: Diagnosis not present

## 2019-05-17 DIAGNOSIS — K219 Gastro-esophageal reflux disease without esophagitis: Secondary | ICD-10-CM | POA: Diagnosis not present

## 2019-05-17 DIAGNOSIS — I2510 Atherosclerotic heart disease of native coronary artery without angina pectoris: Secondary | ICD-10-CM | POA: Diagnosis not present

## 2019-05-17 DIAGNOSIS — E782 Mixed hyperlipidemia: Secondary | ICD-10-CM | POA: Diagnosis not present

## 2019-05-17 DIAGNOSIS — E785 Hyperlipidemia, unspecified: Secondary | ICD-10-CM | POA: Diagnosis not present

## 2019-05-19 DIAGNOSIS — E782 Mixed hyperlipidemia: Secondary | ICD-10-CM | POA: Diagnosis not present

## 2019-05-19 DIAGNOSIS — I2510 Atherosclerotic heart disease of native coronary artery without angina pectoris: Secondary | ICD-10-CM | POA: Diagnosis not present

## 2019-05-19 DIAGNOSIS — K219 Gastro-esophageal reflux disease without esophagitis: Secondary | ICD-10-CM | POA: Diagnosis not present

## 2019-05-19 DIAGNOSIS — Z9181 History of falling: Secondary | ICD-10-CM | POA: Diagnosis not present

## 2019-05-19 DIAGNOSIS — G4700 Insomnia, unspecified: Secondary | ICD-10-CM | POA: Diagnosis not present

## 2019-05-19 DIAGNOSIS — N182 Chronic kidney disease, stage 2 (mild): Secondary | ICD-10-CM | POA: Diagnosis not present

## 2019-05-19 DIAGNOSIS — F17200 Nicotine dependence, unspecified, uncomplicated: Secondary | ICD-10-CM | POA: Diagnosis not present

## 2019-07-04 DIAGNOSIS — I2510 Atherosclerotic heart disease of native coronary artery without angina pectoris: Secondary | ICD-10-CM | POA: Diagnosis not present

## 2019-07-04 DIAGNOSIS — N182 Chronic kidney disease, stage 2 (mild): Secondary | ICD-10-CM | POA: Diagnosis not present

## 2019-07-04 DIAGNOSIS — E782 Mixed hyperlipidemia: Secondary | ICD-10-CM | POA: Diagnosis not present

## 2019-07-04 DIAGNOSIS — K219 Gastro-esophageal reflux disease without esophagitis: Secondary | ICD-10-CM | POA: Diagnosis not present

## 2019-07-04 DIAGNOSIS — E785 Hyperlipidemia, unspecified: Secondary | ICD-10-CM | POA: Diagnosis not present

## 2019-08-09 DIAGNOSIS — J069 Acute upper respiratory infection, unspecified: Secondary | ICD-10-CM | POA: Diagnosis not present

## 2019-08-10 DIAGNOSIS — E782 Mixed hyperlipidemia: Secondary | ICD-10-CM | POA: Diagnosis not present

## 2019-08-10 DIAGNOSIS — E785 Hyperlipidemia, unspecified: Secondary | ICD-10-CM | POA: Diagnosis not present

## 2019-08-10 DIAGNOSIS — I2510 Atherosclerotic heart disease of native coronary artery without angina pectoris: Secondary | ICD-10-CM | POA: Diagnosis not present

## 2019-08-10 DIAGNOSIS — N182 Chronic kidney disease, stage 2 (mild): Secondary | ICD-10-CM | POA: Diagnosis not present

## 2019-08-10 DIAGNOSIS — K219 Gastro-esophageal reflux disease without esophagitis: Secondary | ICD-10-CM | POA: Diagnosis not present

## 2019-09-01 DIAGNOSIS — S20469A Insect bite (nonvenomous) of unspecified back wall of thorax, initial encounter: Secondary | ICD-10-CM | POA: Diagnosis not present

## 2019-09-01 DIAGNOSIS — W540XXA Bitten by dog, initial encounter: Secondary | ICD-10-CM | POA: Diagnosis not present

## 2019-09-01 DIAGNOSIS — Z23 Encounter for immunization: Secondary | ICD-10-CM | POA: Diagnosis not present

## 2019-09-01 DIAGNOSIS — L03113 Cellulitis of right upper limb: Secondary | ICD-10-CM | POA: Diagnosis not present

## 2019-10-06 ENCOUNTER — Ambulatory Visit: Payer: Medicare HMO | Attending: Internal Medicine

## 2019-10-06 DIAGNOSIS — Z23 Encounter for immunization: Secondary | ICD-10-CM | POA: Insufficient documentation

## 2019-10-06 NOTE — Progress Notes (Signed)
   Covid-19 Vaccination Clinic  Name:  AMRA SHUKLA    MRN: [id number] DOB: Jun 28, 1946  10/06/2019  Ms. Demarco was observed post Covid-19 immunization for 15 minutes without incident. She was provided with Vaccine Information Sheet and instruction to access the V-Safe system.   Ms. Digman was instructed to call 911 with any severe reactions post vaccine: Marland Kitchen Difficulty breathing  . Swelling of face and throat  . A fast heartbeat  . A bad rash all over body  . Dizziness and weakness   Immunizations Administered    Name Date Dose VIS Date Route   Pfizer COVID-19 Vaccine 10/06/2019  9:02 AM 0.3 mL 07/15/2019 Intramuscular   Manufacturer: [employer], [employer]   Lot: [id number]   NDC: [address]-8

## 2019-11-02 ENCOUNTER — Ambulatory Visit: Payer: Medicare PPO | Attending: Internal Medicine

## 2019-11-02 DIAGNOSIS — Z23 Encounter for immunization: Secondary | ICD-10-CM

## 2019-11-02 NOTE — Progress Notes (Signed)
   Covid-19 Vaccination Clinic  Name:  Courtney Gill    MRN: [id number] DOB: 31-Dec-1945  11/02/2019  Courtney Gill was observed post Covid-19 immunization for 15 minutes without incident. She was provided with Vaccine Information Sheet and instruction to access the V-Safe system.   Courtney Gill was instructed to call 911 with any severe reactions post vaccine: Marland Kitchen Difficulty breathing  . Swelling of face and throat  . A fast heartbeat  . A bad rash all over body  . Dizziness and weakness   Immunizations Administered    Name Date Dose VIS Date Route   Pfizer COVID-19 Vaccine 11/02/2019  9:01 AM 0.3 mL 07/15/2019 Intramuscular   Manufacturer: [employer], [employer]   Lot: [id number]   NDC: [address]-5

## 2019-11-11 DIAGNOSIS — I2510 Atherosclerotic heart disease of native coronary artery without angina pectoris: Secondary | ICD-10-CM | POA: Diagnosis not present

## 2019-11-11 DIAGNOSIS — F17210 Nicotine dependence, cigarettes, uncomplicated: Secondary | ICD-10-CM | POA: Diagnosis not present

## 2019-11-11 DIAGNOSIS — E782 Mixed hyperlipidemia: Secondary | ICD-10-CM | POA: Diagnosis not present

## 2019-11-11 DIAGNOSIS — F17200 Nicotine dependence, unspecified, uncomplicated: Secondary | ICD-10-CM | POA: Diagnosis not present

## 2019-11-11 DIAGNOSIS — H9190 Unspecified hearing loss, unspecified ear: Secondary | ICD-10-CM | POA: Diagnosis not present

## 2019-11-11 DIAGNOSIS — G4700 Insomnia, unspecified: Secondary | ICD-10-CM | POA: Diagnosis not present

## 2019-11-11 DIAGNOSIS — Z0000 Encounter for general adult medical examination without abnormal findings: Secondary | ICD-10-CM | POA: Diagnosis not present

## 2019-11-11 DIAGNOSIS — J060 Acute laryngopharyngitis: Secondary | ICD-10-CM | POA: Diagnosis not present

## 2019-11-11 DIAGNOSIS — E785 Hyperlipidemia, unspecified: Secondary | ICD-10-CM | POA: Diagnosis not present

## 2019-11-17 DIAGNOSIS — F17210 Nicotine dependence, cigarettes, uncomplicated: Secondary | ICD-10-CM | POA: Diagnosis not present

## 2019-11-17 DIAGNOSIS — I2510 Atherosclerotic heart disease of native coronary artery without angina pectoris: Secondary | ICD-10-CM | POA: Diagnosis not present

## 2019-11-17 DIAGNOSIS — Z0001 Encounter for general adult medical examination with abnormal findings: Secondary | ICD-10-CM | POA: Diagnosis not present

## 2019-11-17 DIAGNOSIS — E782 Mixed hyperlipidemia: Secondary | ICD-10-CM | POA: Diagnosis not present

## 2019-11-17 DIAGNOSIS — N1831 Chronic kidney disease, stage 3a: Secondary | ICD-10-CM | POA: Diagnosis not present

## 2019-11-17 DIAGNOSIS — K219 Gastro-esophageal reflux disease without esophagitis: Secondary | ICD-10-CM | POA: Diagnosis not present

## 2019-11-22 DIAGNOSIS — E785 Hyperlipidemia, unspecified: Secondary | ICD-10-CM | POA: Diagnosis not present

## 2019-11-22 DIAGNOSIS — K219 Gastro-esophageal reflux disease without esophagitis: Secondary | ICD-10-CM | POA: Diagnosis not present

## 2019-11-22 DIAGNOSIS — E782 Mixed hyperlipidemia: Secondary | ICD-10-CM | POA: Diagnosis not present

## 2019-11-22 DIAGNOSIS — N182 Chronic kidney disease, stage 2 (mild): Secondary | ICD-10-CM | POA: Diagnosis not present

## 2019-11-22 DIAGNOSIS — I2510 Atherosclerotic heart disease of native coronary artery without angina pectoris: Secondary | ICD-10-CM | POA: Diagnosis not present

## 2019-12-05 DIAGNOSIS — I2510 Atherosclerotic heart disease of native coronary artery without angina pectoris: Secondary | ICD-10-CM | POA: Diagnosis not present

## 2019-12-05 DIAGNOSIS — N182 Chronic kidney disease, stage 2 (mild): Secondary | ICD-10-CM | POA: Diagnosis not present

## 2019-12-05 DIAGNOSIS — E782 Mixed hyperlipidemia: Secondary | ICD-10-CM | POA: Diagnosis not present

## 2019-12-05 DIAGNOSIS — K219 Gastro-esophageal reflux disease without esophagitis: Secondary | ICD-10-CM | POA: Diagnosis not present

## 2019-12-05 DIAGNOSIS — E785 Hyperlipidemia, unspecified: Secondary | ICD-10-CM | POA: Diagnosis not present

## 2019-12-20 DIAGNOSIS — Z961 Presence of intraocular lens: Secondary | ICD-10-CM | POA: Diagnosis not present

## 2019-12-20 DIAGNOSIS — H509 Unspecified strabismus: Secondary | ICD-10-CM | POA: Diagnosis not present

## 2019-12-20 DIAGNOSIS — H25811 Combined forms of age-related cataract, right eye: Secondary | ICD-10-CM | POA: Diagnosis not present

## 2020-01-09 DIAGNOSIS — E785 Hyperlipidemia, unspecified: Secondary | ICD-10-CM | POA: Diagnosis not present

## 2020-01-09 DIAGNOSIS — I2510 Atherosclerotic heart disease of native coronary artery without angina pectoris: Secondary | ICD-10-CM | POA: Diagnosis not present

## 2020-01-09 DIAGNOSIS — N182 Chronic kidney disease, stage 2 (mild): Secondary | ICD-10-CM | POA: Diagnosis not present

## 2020-01-09 DIAGNOSIS — E782 Mixed hyperlipidemia: Secondary | ICD-10-CM | POA: Diagnosis not present

## 2020-01-09 DIAGNOSIS — K219 Gastro-esophageal reflux disease without esophagitis: Secondary | ICD-10-CM | POA: Diagnosis not present

## 2020-01-11 DIAGNOSIS — E785 Hyperlipidemia, unspecified: Secondary | ICD-10-CM | POA: Diagnosis not present

## 2020-01-11 DIAGNOSIS — H9190 Unspecified hearing loss, unspecified ear: Secondary | ICD-10-CM | POA: Diagnosis not present

## 2020-01-11 DIAGNOSIS — E782 Mixed hyperlipidemia: Secondary | ICD-10-CM | POA: Diagnosis not present

## 2020-01-11 DIAGNOSIS — G4700 Insomnia, unspecified: Secondary | ICD-10-CM | POA: Diagnosis not present

## 2020-01-11 DIAGNOSIS — F17210 Nicotine dependence, cigarettes, uncomplicated: Secondary | ICD-10-CM | POA: Diagnosis not present

## 2020-01-11 DIAGNOSIS — F17200 Nicotine dependence, unspecified, uncomplicated: Secondary | ICD-10-CM | POA: Diagnosis not present

## 2020-01-11 DIAGNOSIS — Z0000 Encounter for general adult medical examination without abnormal findings: Secondary | ICD-10-CM | POA: Diagnosis not present

## 2020-01-11 DIAGNOSIS — Z0001 Encounter for general adult medical examination with abnormal findings: Secondary | ICD-10-CM | POA: Diagnosis not present

## 2020-01-11 DIAGNOSIS — I2510 Atherosclerotic heart disease of native coronary artery without angina pectoris: Secondary | ICD-10-CM | POA: Diagnosis not present

## 2020-02-03 DIAGNOSIS — K219 Gastro-esophageal reflux disease without esophagitis: Secondary | ICD-10-CM | POA: Diagnosis not present

## 2020-02-03 DIAGNOSIS — E782 Mixed hyperlipidemia: Secondary | ICD-10-CM | POA: Diagnosis not present

## 2020-02-03 DIAGNOSIS — N182 Chronic kidney disease, stage 2 (mild): Secondary | ICD-10-CM | POA: Diagnosis not present

## 2020-02-03 DIAGNOSIS — I2510 Atherosclerotic heart disease of native coronary artery without angina pectoris: Secondary | ICD-10-CM | POA: Diagnosis not present

## 2020-02-03 DIAGNOSIS — E785 Hyperlipidemia, unspecified: Secondary | ICD-10-CM | POA: Diagnosis not present

## 2020-02-16 ENCOUNTER — Emergency Department (HOSPITAL_COMMUNITY)
Admission: EM | Admit: 2020-02-16 | Discharge: 2020-02-16 | Disposition: A | Discharge status: Home / Self Care | Payer: Medicare PPO | Attending: Emergency Medicine | Admitting: Emergency Medicine

## 2020-02-16 ENCOUNTER — Encounter (HOSPITAL_COMMUNITY): Payer: Self-pay | Admitting: Emergency Medicine

## 2020-02-16 ENCOUNTER — Other Ambulatory Visit: Payer: Self-pay

## 2020-02-16 DIAGNOSIS — I10 Essential (primary) hypertension: Secondary | ICD-10-CM | POA: Diagnosis not present

## 2020-02-16 DIAGNOSIS — F17210 Nicotine dependence, cigarettes, uncomplicated: Secondary | ICD-10-CM | POA: Insufficient documentation

## 2020-02-16 DIAGNOSIS — T783XXA Angioneurotic edema, initial encounter: Secondary | ICD-10-CM | POA: Diagnosis not present

## 2020-02-16 DIAGNOSIS — R220 Localized swelling, mass and lump, head: Secondary | ICD-10-CM | POA: Diagnosis present

## 2020-02-16 DIAGNOSIS — Z7901 Long term (current) use of anticoagulants: Secondary | ICD-10-CM | POA: Diagnosis not present

## 2020-02-16 LAB — CBC WITH DIFFERENTIAL/PLATELET
Abs Immature Granulocytes: 0.03 10*3/uL (ref 0.00–0.07)
Basophils Absolute: 0.1 10*3/uL (ref 0.0–0.1)
Basophils Relative: 1 %
Eosinophils Absolute: 0.2 10*3/uL (ref 0.0–0.5)
Eosinophils Relative: 3 %
HCT: 45.2 % (ref 36.0–46.0)
Hemoglobin: 14.8 g/dL (ref 12.0–15.0)
Immature Granulocytes: 1 %
Lymphocytes Relative: 36 %
Lymphs Abs: 2.1 10*3/uL (ref 0.7–4.0)
MCH: 32 pg (ref 26.0–34.0)
MCHC: 32.7 g/dL (ref 30.0–36.0)
MCV: 97.8 fL (ref 80.0–100.0)
Monocytes Absolute: 0.6 10*3/uL (ref 0.1–1.0)
Monocytes Relative: 10 %
Neutro Abs: 2.9 10*3/uL (ref 1.7–7.7)
Neutrophils Relative %: 49 %
Platelets: 222 10*3/uL (ref 150–400)
RBC: 4.62 MIL/uL (ref 3.87–5.11)
RDW: 13.6 % (ref 11.5–15.5)
WBC: 5.8 10*3/uL (ref 4.0–10.5)
nRBC: 0 % (ref 0.0–0.2)

## 2020-02-16 LAB — BASIC METABOLIC PANEL
Anion gap: 9 (ref 5–15)
BUN: 18 mg/dL (ref 8–23)
CO2: 25 mmol/L (ref 22–32)
Calcium: 9 mg/dL (ref 8.9–10.3)
Chloride: 107 mmol/L (ref 98–111)
Creatinine, Ser: 0.93 mg/dL (ref 0.44–1.00)
GFR calc Af Amer: >60 mL/min (ref >60)
GFR calc non Af Amer: >60 mL/min (ref >60)
Glucose, Bld: 100 mg/dL — ABNORMAL HIGH (ref 70–99)
Potassium: 3.9 mmol/L (ref 3.5–5.1)
Sodium: 141 mmol/L (ref 135–145)

## 2020-02-16 MED ORDER — PREDNISONE 50 MG PO TABS
50.0000 mg | ORAL_TABLET | Freq: Every day | ORAL | 0 refills | Status: DC
Start: 2020-02-16 — End: 2020-03-28

## 2020-02-16 MED ORDER — DIPHENHYDRAMINE HCL 50 MG/ML IJ SOLN
25.0000 mg | Freq: Once | INTRAMUSCULAR | Status: AC
  Administered 2020-02-16: 25 mg via INTRAVENOUS
  Filled 2020-02-16: qty 1

## 2020-02-16 MED ORDER — EPINEPHRINE 0.3 MG/0.3ML IJ SOAJ
0.3000 mg | Freq: Once | INTRAMUSCULAR | Status: AC
  Administered 2020-02-16: 0.3 mg via INTRAMUSCULAR
  Filled 2020-02-16: qty 0.3

## 2020-02-16 MED ORDER — FAMOTIDINE IN NACL 20-0.9 MG/50ML-% IV SOLN
20.0000 mg | Freq: Once | INTRAVENOUS | Status: AC
  Administered 2020-02-16: 20 mg via INTRAVENOUS
  Filled 2020-02-16: qty 50

## 2020-02-16 MED ORDER — METHYLPREDNISOLONE SODIUM SUCC 125 MG IJ SOLR
125.0000 mg | Freq: Once | INTRAMUSCULAR | Status: AC
  Administered 2020-02-16: 125 mg via INTRAVENOUS
  Filled 2020-02-16: qty 2

## 2020-02-16 MED ORDER — PREDNISONE 50 MG PO TABS
50.0000 mg | ORAL_TABLET | Freq: Every day | ORAL | 0 refills | Status: DC
Start: 2020-02-16 — End: 2020-02-16

## 2020-02-16 NOTE — Discharge Instructions (Addendum)
Take cetirizine (Zyrtec) once a day.  Take famotidine (Pepcid  AC) twice a day.

## 2020-02-16 NOTE — ED Provider Notes (Signed)
[facility] EMERGENCY DEPARTMENT Provider Note   CSN: [id number] Arrival date & time: 02/16/20  0244   History Chief Complaint  Patient presents with  . Angioedema    Courtney Gill is a 74 y.o. female.  The history is provided by the patient.  She has history of hypertension, hyperlipidemia, peripheral vascular disease, angioedema and comes in because of tongue swelling which started about an hour before presenting to the ED.  She woke up with her tongue swollen.  She denies any itching and denies any difficulty breathing.  Speech is difficult because of enlarged tongue.  She has had no new medications.  Cause for angioedema in the past has not been identified.  There has been no treatment at home.  Past Medical History:  Diagnosis Date  . Angioedema July 2014  . GERD (gastroesophageal reflux disease)   . Hypercholesterolemia   . Hypertension   . Migraine   . Peripheral artery disease [facility])     Patient Active Problem List   Diagnosis Date Noted  . 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 Loud snoring 06/02/2018  . PAD (peripheral artery disease) (HCC) 06/02/2018  . Psychophysiological insomnia 06/02/2018  . Angioedema 02/28/2013  . HTN (hypertension) 02/28/2013  . Hyperlipidemia 02/28/2013    Past Surgical History:  Procedure Laterality Date  . CAROTID STENT Right 2007  . EYE SURGERY    . TONSILLECTOMY       OB History   No obstetric history on file.     Family History  Problem Relation Age of Onset  . Atrial fibrillation Mother   . Diabetes Maternal Grandfather   . Cancer Paternal Grandfather     Social History   Tobacco Use  . Smoking status: Current Every Day Smoker    Packs/day: 1.00    Types: Cigarettes  . Smokeless tobacco: Never Used  Substance Use Topics  . Alcohol use: No  . Drug use: No    Home Medications Prior to Admission medications   Medication Sig Start Date End Date Taking? Authorizing Provider  Biotin 1000 MCG tablet Take  1,000 mcg by mouth daily.    [provider]  Cholecalciferol (VITAMIN D PO) Take 5,000 Units by mouth daily.    [provider]  clopidogrel (PLAVIX) 75 MG tablet Take 1 tablet (75 mg total) by mouth daily with breakfast. 03/01/13   Jeanella Craze, NP  diphenhydrAMINE (BENADRYL) 50 MG capsule Take 1 capsule (50 mg total) by mouth every 6 (six) hours as needed for itching or allergies. 03/01/13   Jeanella Craze, NP  EPINEPHrine (EPI-PEN) 0.3 mg/0.3 mL SOAJ Inject 0.3 mLs (0.3 mg total) into the muscle once. 03/01/13   Jeanella Craze, NP  ibuprofen (ADVIL,MOTRIN) 200 MG tablet Take 200 mg by mouth every 6 (six) hours as needed.    [provider]  Omega-3 Fatty Acids (OMEGA-3 FISH OIL PO) Take by mouth.    [provider]  pantoprazole (PROTONIX) 40 MG tablet Take 40 mg by mouth daily.    [provider]  rosuvastatin (CRESTOR) 10 MG tablet  05/08/18   [provider]  traMADol (ULTRAM) 50 MG tablet Take by mouth every 6 (six) hours as needed.    [provider]  traZODone (DESYREL) 50 MG tablet Take 0.5 tablets (25 mg total) by mouth at bedtime. 06/02/18   Dohmeier, Porfirio Mylar, MD  vitamin E 400 UNIT capsule Take 400 Units by mouth daily.    [provider]  Allergies    Aspirin  Review of Systems   Review of Systems  All other systems reviewed and are negative.   Physical Exam Updated Vital Signs BP (!) 156/91   Pulse 72   Temp (!) 97.4 F (36.3 C)   Resp 18   Ht 5\' 4"  (1.626 m)   Wt 68 kg   SpO2 96%   BMI 25.75 kg/m   Physical Exam Vitals and nursing note reviewed.   74 year old female, resting comfortably and in no acute distress. Vital signs are significant for elevated blood pressure. Oxygen saturation is 96%, which is normal. Head is normocephalic and atraumatic. PERRLA, EOMI. Tongue is swollen and I am unable to see the oropharynx.  Speech is slightly slurred because of tongue thickening.  She is  tolerating secretions. Neck is nontender and supple without adenopathy or JVD.  There is no stridor. Back is nontender and there is no CVA tenderness. Lungs are clear without rales, wheezes, or rhonchi. Chest is nontender. Heart has regular rate and rhythm without murmur. Abdomen is soft, flat, nontender without masses or hepatosplenomegaly and peristalsis is normoactive. Extremities have no cyanosis or edema, full range of motion is present. Skin is warm and dry without rash. Neurologic: Mental status is normal, cranial nerves are intact, there are no motor or sensory deficits.  ED Results / Procedures / Treatments   Labs (all labs ordered are listed, but only abnormal results are displayed) Labs Reviewed - No data to display  EKG None  Radiology No results found.  Procedures Procedures  CRITICAL CARE Performed by: 61 Total critical care time: 135 minutes Critical care time was exclusive of separately billable procedures and treating other patients. Critical care was necessary to treat or prevent imminent or life-threatening deterioration. Critical care was time spent personally by me on the following activities: development of treatment plan with patient and/or surrogate as well as nursing, discussions with consultants, evaluation of patient's response to treatment, examination of patient, obtaining history from patient or surrogate, ordering and performing treatments and interventions, ordering and review of laboratory studies, ordering and review of radiographic studies, pulse oximetry and re-evaluation of patient's condition.  Medications Ordered in ED Medications  diphenhydrAMINE (BENADRYL) injection 25 mg (has no administration in time range)  EPINEPHrine (EPI-PEN) injection 0.3 mg (has no administration in time range)  famotidine (PEPCID) IVPB 20 mg premix (has no administration in time range)  methylPREDNISolone sodium succinate (SOLU-MEDROL) 125 mg/2 mL  injection 125 mg (has no administration in time range)    ED Course  I have reviewed the triage vital signs and the nursing notes.  Pertinent labs & imaging results that were available during my care of the patient were reviewed by me and considered in my medical decision making (see chart for details).  MDM Rules/Calculators/A&P Angioedema of the tongue.  Old records are reviewed and she was hospitalized for this and 2014 at which point the offending agent was felt to be aspirin and she was taken off of aspirin.  She had an ED visit in 2015 with a similar complaint.  She is not on any ACE inhibitor's or ARB's.  None of her medications are ones that are commonly associated with angioedema.  She is given epinephrine, diphenhydramine, famotidine, methylprednisolone and will be observed closely.  She was observed in the ED over a 4-hour period.  During that time, there were frequent reevaluations.  She had gradual reduction in the edema of her tongue.  Her speech  is now much clearer.  She is felt to be safe for discharge.  She is discharged with prescription for prednisone and advised to take both famotidine and cetirizine at home.  She is to follow-up with her allergist.  Return precautions discussed.  Final Clinical Impression(s) / ED Diagnoses Final diagnoses:  Angioedema, initial encounter    Rx / DC Orders ED Discharge Orders         Ordered    predniSONE (DELTASONE) 50 MG tablet  Daily     Discontinue  Reprint     02/16/20 0656           Dione Booze, MD 02/16/20 [phone]

## 2020-02-16 NOTE — ED Triage Notes (Signed)
Pt presents with angioedema. Pt states she was asleep tonight and woke up realizing that her tongue was swollen.

## 2020-03-28 ENCOUNTER — Ambulatory Visit: Payer: Medicare Other | Admitting: Allergy & Immunology

## 2020-03-28 ENCOUNTER — Other Ambulatory Visit: Payer: Self-pay

## 2020-03-28 VITALS — BP 102/78 | HR 80 | Temp 98.4°F | Resp 18 | Ht 64.0 in | Wt 150.0 lb

## 2020-03-28 DIAGNOSIS — F17200 Nicotine dependence, unspecified, uncomplicated: Secondary | ICD-10-CM | POA: Diagnosis not present

## 2020-03-28 DIAGNOSIS — T783XXD Angioneurotic edema, subsequent encounter: Secondary | ICD-10-CM | POA: Diagnosis not present

## 2020-03-28 DIAGNOSIS — Z9181 History of falling: Secondary | ICD-10-CM | POA: Diagnosis not present

## 2020-03-28 DIAGNOSIS — I2510 Atherosclerotic heart disease of native coronary artery without angina pectoris: Secondary | ICD-10-CM | POA: Diagnosis not present

## 2020-03-28 DIAGNOSIS — S20469A Insect bite (nonvenomous) of unspecified back wall of thorax, initial encounter: Secondary | ICD-10-CM | POA: Diagnosis not present

## 2020-03-28 DIAGNOSIS — N1831 Chronic kidney disease, stage 3a: Secondary | ICD-10-CM | POA: Diagnosis not present

## 2020-03-28 DIAGNOSIS — M65331 Trigger finger, right middle finger: Secondary | ICD-10-CM | POA: Diagnosis not present

## 2020-03-28 NOTE — Progress Notes (Signed)
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  Her attacks are few and far between, which makes me hesitate to use something on a daily basis.  However, when they happen, it seems that they progressed very quickly.  I think it would be worth her effort to take a daily antihistamine with few side effects, such as cetirizine, to have baseline antihistaminergic activity on board.  We are to get some labs to rule out serious causes of swelling and hives, some of which have been done in the past, just to see if her labs have evolved over time.  I do not think that skin testing today would help at all given her lack of allergic rhinitis symptoms as well as her apparent tolerance of all foods.  Plan/Recommendations:   1. Angioedema - unknown trigger - We are going to get some labs to rule out serious causes of hives/swelling.  - We are going to get an environmental allergy panel as well as a mold panel to look for environmental allergens.  - We are going to rule out autoimmune causes of your hives/swelling.  - We are going to rule out something called hereditary angioedema, which is unlikely.  - We are going to get an alpha gal panel as well as a common food panel. - I do not want to put you on a daily medication when you are having these episodes only once in a few years. - When you feel these episodes coming on, start suppressive antihistamines such as Zyrtec (cetirizine) twice daily to see if this can keep you out of the hospital. - Call us to make an appointment when you feel these episodes coming on.    2. Return in about 6 months (around 09/28/2020).   Subjective:   Courtney Gill is a 74 y.o. female presenting today for evaluation of No chief complaint on file.   Courtney Gill has a history of the  following: Patient Active Problem List   Diagnosis Date Noted  . 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 Loud snoring 06/02/2018  . PAD (peripheral artery disease) (HCC) 06/02/2018  . Psychophysiological insomnia 06/02/2018  . Angioedema 02/28/2013  . HTN (hypertension) 02/28/2013  . Hyperlipidemia 02/28/2013    History obtained from: chart review and patient.   Courtney Gill was referred by Benita Stabile, MD.      Courtney Gill is a 74 y.o. female presenting for an evaluation of concern for food allergies.  She has had angioedema going back several years. In 2014, the offending agent was felt to be aspirin so this was discontinued. However, she had an emergency room visit in 2015 with a similar complaint. She has not been on ACE inhibitor as her arms. She has not had any episodes between 2015 and July 2021. She did see Dr. Willa Rough are some point.   She went to the emergency room in July 2021 on the 15th. She had gone to a dinner theater the night before. She woke up and the side of her face felt flat. At that time, she received diphenhydramine, epinephrine, methylprednisolone, famotidine, and was discharged. A BMP and a CBC with differential were negative. She had a large tongue at the time. She is on a different blood thinner now (Plavix). The only connection  between all of the episodes is the month of July.   She was placed on prednisone at every episodes of anaphylaxis. She does have copies of her skin testing and blood work from Dr. Willa Rough. Nothing was very enlightening.  She had a C4 level that was normal, an IgE level of 63.5, shrimp IgE was negative, and C1 esterase inhibitor level and function was normal.  An alpha gal panel showed an alpha gal IgE of only 0.19.  Dr. Willa Rough did not think that was significant.  Her C1q level was normal and her C2 level was normal.  Her environmental allergy panel was equivocal to mold mix 2 and mold mix 3 on intradermal testing.  She was 1+ to tree mix on  intradermal testing and 2+ to dust mite on prick testing.  Food panel was negative to everything tested including the most common foods, pork, beef, banana, apple, carrots, celery, shrimp, chocolate, and cinnamon.  Her atopic history is otherwise unremarkable.  She does not have any symptoms of allergic rhinitis.  She does not have hives necessarily with the swelling episodes, although there is itching.  She denies having asthma, although she makes reference to an inhaler later on during the visit.  It seems to be only albuterol and she does not use it on a routine basis at all.  She is a smoker for several decades now, smoking half a pack up to a pack per day.  She has received her COVID19 vaccines and tolerated those without a problem.   Otherwise, there is no history of other atopic diseases, including asthma, food allergies, drug allergies, environmental allergies, stinging insect allergies or contact dermatitis. There is no significant infectious history. Vaccinations are up to date.    Past Medical History: Patient Active Problem List   Diagnosis Date Noted  . 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 Loud snoring 06/02/2018  . PAD (peripheral artery disease) (HCC) 06/02/2018  . Psychophysiological insomnia 06/02/2018  . Angioedema 02/28/2013  . HTN (hypertension) 02/28/2013  . Hyperlipidemia 02/28/2013    Medication List:  Allergies as of 03/28/2020      Reactions   Aspirin Swelling      Medication List       Accurate as of March 28, 2020 11:59 PM. If you have any questions, ask your nurse or doctor.        STOP taking these medications   predniSONE 50 MG tablet Commonly known as: DELTASONE Stopped by: Alfonse Spruce, MD     TAKE these medications   Biotin 1000 MCG tablet Take 1,000 mcg by mouth daily.   clopidogrel 75 MG tablet Commonly known as: PLAVIX Take 1 tablet (75 mg total) by mouth daily with breakfast.   diphenhydrAMINE 50 MG capsule Commonly  known as: BENADRYL Take 1 capsule (50 mg total) by mouth every 6 (six) hours as needed for itching or allergies.   EPINEPHrine 0.3 mg/0.3 mL Soaj injection Commonly known as: EPI-PEN Inject 0.3 mLs (0.3 mg total) into the muscle once.   ibuprofen 200 MG tablet Commonly known as: ADVIL Take 200 mg by mouth every 6 (six) hours as needed.   OMEGA-3 FISH OIL PO Take by mouth.   pantoprazole 40 MG tablet Commonly known as: PROTONIX Take 40 mg by mouth daily.   rosuvastatin 10 MG tablet Commonly known as: CRESTOR   traMADol 50 MG tablet Commonly known as: ULTRAM Take by mouth every 6 (six) hours as needed.   traZODone 50 MG tablet  Commonly known as: DESYREL Take 0.5 tablets (25 mg total) by mouth at bedtime.   VITAMIN D PO Take 5,000 Units by mouth daily.   vitamin E 180 MG (400 UNITS) capsule Take 400 Units by mouth daily.       Birth History: non-contributory  Developmental History: non-contributory  Past Surgical History: Past Surgical History:  Procedure Laterality Date  . CAROTID STENT Right 2007  . EYE SURGERY    . TONSILLECTOMY       Family History: Family History  Problem Relation Age of Onset  . Atrial fibrillation Mother   . Diabetes Maternal Grandfather   . Cancer Paternal Grandfather      Social History: Dorthie lives in a modular home that is 72 years old.  There is laminated wood throughout the home.  She does have some rugs.  She has electric heating and central cooling.  There was a cat that passed away in 18-Jul-2021otherwise there are no pets.  She does not have dust mite covers on her bedding.  There is no tobacco exposure.  She smokes around 1 pack/day.   Review of Systems  Constitutional: Positive for chills. Negative for fever, malaise/fatigue and weight loss.  HENT: Negative for congestion, ear discharge, ear pain and sinus pain.   Eyes: Negative for pain, discharge and redness.  Respiratory: Negative for cough, sputum production,  shortness of breath and wheezing.   Cardiovascular: Negative.  Negative for chest pain and palpitations.  Gastrointestinal: Negative for abdominal pain, constipation, diarrhea, heartburn, nausea and vomiting.  Skin: Negative.  Negative for itching and rash.       Positive for angioedema.  Positive for pruritus.  Neurological: Negative for dizziness and headaches.  Endo/Heme/Allergies: Negative for environmental allergies. Does not bruise/bleed easily.       Objective:   Blood pressure 102/78, pulse 80, temperature 98.4 F (36.9 C), temperature source Temporal, resp. rate 18, height 5\' 4"  (1.626 m), weight 150 lb (68 kg), SpO2 98 %. Body mass index is 25.75 kg/m.   Physical Exam:   Physical Exam Constitutional:      Appearance: She is well-developed.     Comments: Pleasant female. Talkative.   HENT:     Head: Normocephalic and atraumatic.     Right Ear: Tympanic membrane, ear canal and external ear normal. No drainage, swelling or tenderness. Tympanic membrane is not injected, scarred, erythematous, retracted or bulging.     Left Ear: Tympanic membrane, ear canal and external ear normal. No drainage, swelling or tenderness. Tympanic membrane is not injected, scarred, erythematous, retracted or bulging.     Nose: No nasal deformity, septal deviation, mucosal edema or rhinorrhea.     Right Sinus: No maxillary sinus tenderness or frontal sinus tenderness.     Left Sinus: No maxillary sinus tenderness or frontal sinus tenderness.     Mouth/Throat:     Mouth: Mucous membranes are not pale and not dry.     Pharynx: Uvula midline.  Eyes:     General:        Right eye: No discharge.        Left eye: No discharge.     Conjunctiva/sclera: Conjunctivae normal.     Right eye: Right conjunctiva is not injected. No chemosis.    Left eye: Left conjunctiva is not injected. No chemosis.    Pupils: Pupils are equal, round, and reactive to light.  Cardiovascular:     Rate and Rhythm: Normal  rate and regular rhythm.  Heart sounds: Normal heart sounds.  Pulmonary:     Effort: Pulmonary effort is normal. No tachypnea, accessory muscle usage or respiratory distress.     Breath sounds: Normal breath sounds. No wheezing, rhonchi or rales.  Chest:     Chest wall: No tenderness.  Abdominal:     Tenderness: There is no abdominal tenderness. There is no guarding or rebound.  Lymphadenopathy:     Head:     Right side of head: No submandibular, tonsillar or occipital adenopathy.     Left side of head: No submandibular, tonsillar or occipital adenopathy.     Cervical: No cervical adenopathy.  Skin:    Coloration: Skin is not pale.     Findings: No abrasion, erythema, petechiae or rash. Rash is not papular, urticarial or vesicular.  Neurological:     Mental Status: She is alert.      Diagnostic studies: none        Malachi BondsJoel Nazareth Norenberg, MD [facility]

## 2020-03-28 NOTE — Patient Instructions (Addendum)
1. Angioedema - unknown trigger - We are going to get some labs to rule out serious causes of hives/swelling.  - We are going to get an environmental allergy panel as well as a mold panel to look for environmental allergens.  - We are going to rule out autoimmune causes of your hives/swelling.  - We are going to rule out something called hereditary angioedema, which is unlikely.  - We are going to get an alpha gal panel as well as a common food panel. - I do not want to put you on a daily medication when you are having these episodes only once in a few years. - When you feel these episodes coming on, start suppressive antihistamines such as Zyrtec (cetirizine) twice daily to see if this can keep you out of the hospital. - Call us to make an appointment when you feel these episodes coming on.    2. Return in about 6 months (around 09/28/2020).    Please inform us of any Emergency Department visits, hospitalizations, or changes in symptoms. Call us before going to the ED for breathing or allergy symptoms since we might be able to fit you in for a sick visit. Feel free to contact us anytime with any questions, problems, or concerns.  It was a pleasure to see you and your family again today!  Websites that have reliable patient information: 1. American Academy of Asthma, Allergy, and Immunology: www.aaaai.org 2. Food Allergy Research and Education (FARE): foodallergy.org 3. Mothers of Asthmatics: http://www.asthmacommunitynetwork.org 4. American College of Allergy, Asthma, and Immunology: www.acaai.org   COVID-19 Vaccine Information can be found at: PodExchange.nl For questions related to vaccine distribution or appointments, please email vaccine@[email or web address] or call [phone].     "Like" [place] on Facebook and Instagram for our latest updates!        Make sure you are registered to vote! If you have moved or changed any of  your contact information, you will need to get this updated before voting!  In some cases, you MAY be able to register to vote online: [email or web address]

## 2020-03-29 ENCOUNTER — Encounter: Payer: Self-pay | Admitting: Allergy & Immunology

## 2020-05-14 ENCOUNTER — Other Ambulatory Visit: Payer: Self-pay

## 2020-05-14 ENCOUNTER — Emergency Department (HOSPITAL_COMMUNITY)
Admission: EM | Admit: 2020-05-14 | Discharge: 2020-05-14 | Disposition: A | Discharge status: Home / Self Care | Payer: Medicare Other | Attending: Emergency Medicine | Admitting: Emergency Medicine

## 2020-05-14 ENCOUNTER — Encounter (HOSPITAL_COMMUNITY): Payer: Self-pay | Admitting: Emergency Medicine

## 2020-05-14 DIAGNOSIS — T7840XA Allergy, unspecified, initial encounter: Secondary | ICD-10-CM | POA: Diagnosis present

## 2020-05-14 DIAGNOSIS — T783XXA Angioneurotic edema, initial encounter: Secondary | ICD-10-CM | POA: Diagnosis not present

## 2020-05-14 DIAGNOSIS — N1831 Chronic kidney disease, stage 3a: Secondary | ICD-10-CM | POA: Diagnosis not present

## 2020-05-14 DIAGNOSIS — R030 Elevated blood-pressure reading, without diagnosis of hypertension: Secondary | ICD-10-CM | POA: Insufficient documentation

## 2020-05-14 DIAGNOSIS — I10 Essential (primary) hypertension: Secondary | ICD-10-CM | POA: Insufficient documentation

## 2020-05-14 DIAGNOSIS — F17210 Nicotine dependence, cigarettes, uncomplicated: Secondary | ICD-10-CM | POA: Diagnosis not present

## 2020-05-14 DIAGNOSIS — M65331 Trigger finger, right middle finger: Secondary | ICD-10-CM | POA: Diagnosis not present

## 2020-05-14 DIAGNOSIS — I2510 Atherosclerotic heart disease of native coronary artery without angina pectoris: Secondary | ICD-10-CM | POA: Diagnosis not present

## 2020-05-14 DIAGNOSIS — S20469A Insect bite (nonvenomous) of unspecified back wall of thorax, initial encounter: Secondary | ICD-10-CM | POA: Diagnosis not present

## 2020-05-14 DIAGNOSIS — J060 Acute laryngopharyngitis: Secondary | ICD-10-CM | POA: Diagnosis not present

## 2020-05-14 MED ORDER — METHYLPREDNISOLONE SODIUM SUCC 125 MG IJ SOLR
125.0000 mg | Freq: Once | INTRAMUSCULAR | Status: AC
  Administered 2020-05-14: 125 mg via INTRAVENOUS
  Filled 2020-05-14: qty 2

## 2020-05-14 MED ORDER — EPINEPHRINE 0.3 MG/0.3ML IJ SOAJ: 0.3000 mg | Freq: Once | INTRAMUSCULAR | 0 refills | Status: AC

## 2020-05-14 MED ORDER — PREDNISONE 50 MG PO TABS
50.0000 mg | ORAL_TABLET | Freq: Every day | ORAL | 0 refills | Status: DC
Start: 2020-05-14 — End: 2020-06-01

## 2020-05-14 MED ORDER — DIPHENHYDRAMINE HCL 50 MG/ML IJ SOLN
25.0000 mg | Freq: Once | INTRAMUSCULAR | Status: AC
  Administered 2020-05-14: 25 mg via INTRAVENOUS
  Filled 2020-05-14: qty 1

## 2020-05-14 MED ORDER — FAMOTIDINE IN NACL 20-0.9 MG/50ML-% IV SOLN
20.0000 mg | Freq: Once | INTRAVENOUS | Status: AC
  Administered 2020-05-14: 20 mg via INTRAVENOUS
  Filled 2020-05-14: qty 50

## 2020-05-14 NOTE — Discharge Instructions (Addendum)
Return if you are having any problems. 

## 2020-05-14 NOTE — ED Triage Notes (Addendum)
Pt here for c/o allergic reaction. Pt states she was seen for same in July. Pt with swollen tongue. No difficulty breathing. Denies other symptoms. NAD. Pt states she used her EPI pen prior to arrival.

## 2020-05-14 NOTE — ED Provider Notes (Signed)
[facility] EMERGENCY DEPARTMENT Provider Note   CSN: [id number] Arrival date & time: 05/14/20  0304   History Chief Complaint  Patient presents with  . Allergic Reaction    Courtney Gill is a 74 y.o. female.  The history is provided by the patient.  Allergic Reaction She has history of hypertension, hyperlipidemia, peripheral vascular disease, angioedema and comes in because of swelling in her tongue.  She woke up and noted her tongue was swollen.  She has not had any difficulty breathing or swallowing.  She used her EpiPen and has noted some slight improvement.  She has been seen several times for the same condition with no cause being found.  She is not on any angiotensin converting enzyme medications and not on any angiotensin receptor blockers.  Past Medical History:  Diagnosis Date  . Angioedema July 2014  . GERD (gastroesophageal reflux disease)   . Hypercholesterolemia   . Hypertension   . Migraine   . Peripheral artery disease [facility])     Patient Active Problem List   Diagnosis Date Noted  . 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 Loud snoring 06/02/2018  . PAD (peripheral artery disease) (HCC) 06/02/2018  . Psychophysiological insomnia 06/02/2018  . Angioedema 02/28/2013  . HTN (hypertension) 02/28/2013  . Hyperlipidemia 02/28/2013    Past Surgical History:  Procedure Laterality Date  . CAROTID STENT Right 2007  . EYE SURGERY    . TONSILLECTOMY       OB History   No obstetric history on file.     Family History  Problem Relation Age of Onset  . Atrial fibrillation Mother   . Diabetes Maternal Grandfather   . Cancer Paternal Grandfather     Social History   Tobacco Use  . Smoking status: Current Every Day Smoker    Packs/day: 1.00    Types: Cigarettes  . Smokeless tobacco: Never Used  Substance Use Topics  . Alcohol use: No  . Drug use: No    Home Medications Prior to Admission medications   Medication Sig Start Date End Date Taking?  Authorizing Provider  Biotin 1000 MCG tablet Take 1,000 mcg by mouth daily.    [provider]  Cholecalciferol (VITAMIN D PO) Take 5,000 Units by mouth daily.    [provider]  clopidogrel (PLAVIX) 75 MG tablet Take 1 tablet (75 mg total) by mouth daily with breakfast. 03/01/13   Jeanella Craze, NP  diphenhydrAMINE (BENADRYL) 50 MG capsule Take 1 capsule (50 mg total) by mouth every 6 (six) hours as needed for itching or allergies. 03/01/13   Jeanella Craze, NP  EPINEPHrine (EPI-PEN) 0.3 mg/0.3 mL SOAJ Inject 0.3 mLs (0.3 mg total) into the muscle once. 03/01/13   Jeanella Craze, NP  ibuprofen (ADVIL,MOTRIN) 200 MG tablet Take 200 mg by mouth every 6 (six) hours as needed.    [provider]  Omega-3 Fatty Acids (OMEGA-3 FISH OIL PO) Take by mouth.    [provider]  pantoprazole (PROTONIX) 40 MG tablet Take 40 mg by mouth daily.    [provider]  rosuvastatin (CRESTOR) 10 MG tablet  05/08/18   [provider]  traMADol (ULTRAM) 50 MG tablet Take by mouth every 6 (six) hours as needed.    [provider]  traZODone (DESYREL) 50 MG tablet Take 0.5 tablets (25 mg total) by mouth at bedtime. 06/02/18   Dohmeier, Porfirio Mylar, MD  vitamin E 400 UNIT capsule Take 400 Units by mouth daily.  [provider]    Allergies    Aspirin  Review of Systems   Review of Systems  All other systems reviewed and are negative.   Physical Exam Updated Vital Signs BP (!) 163/77 (BP Location: Right Arm)   Pulse 74   Temp 98.3 F (36.8 C) (Oral)   Resp (!) 22   Ht 5\' 4"  (1.626 m)   Wt 69.4 kg   SpO2 100%   BMI 26.26 kg/m   Physical Exam Vitals and nursing note reviewed.   74 year old female, resting comfortably and in no acute distress. Vital signs are significant for elevated blood pressure and mildly elevated respiratory rate. Oxygen saturation is 100%, which is normal. Head is normocephalic and atraumatic. PERRLA, EOMI. there  is generalized swelling of the tongue with no swelling of the sublingual tissues.  Speech is slightly dysarthric because of the tongue swelling.  There is no edema of the uvula or pharynx.  There is no pooling of secretions.  Other than dysarthria, phonation is normal.  There is no stridor. Neck is nontender and supple without adenopathy or JVD. Back is nontender and there is no CVA tenderness. Lungs are clear without rales, wheezes, or rhonchi. Chest is nontender. Heart has regular rate and rhythm without murmur. Abdomen is soft, flat, nontender without masses or hepatosplenomegaly and peristalsis is normoactive. Extremities have no cyanosis or edema, full range of motion is present. Skin is warm and dry without rash. Neurologic: Mental status is normal, cranial nerves are intact, there are no motor or sensory deficits.  ED Results / Procedures / Treatments    Procedures Procedures  CRITICAL CARE Performed by: 61 Total critical care time: 50 minutes Critical care time was exclusive of separately billable procedures and treating other patients. Critical care was necessary to treat or prevent imminent or life-threatening deterioration. Critical care was time spent personally by me on the following activities: development of treatment plan with patient and/or surrogate as well as nursing, discussions with consultants, evaluation of patient's response to treatment, examination of patient, obtaining history from patient or surrogate, ordering and performing treatments and interventions, ordering and review of laboratory studies, ordering and review of radiographic studies, pulse oximetry and re-evaluation of patient's condition.   EKG Interpretation  Date/Time:  Monday May 14 2020 03:25:32 EDT Ventricular Rate:  68 PR Interval:    QRS Duration: 84 QT Interval:  413 QTC Calculation: 440 R Axis:   53 Text Interpretation: Sinus rhythm Probable left atrial enlargement Minimal ST  depression, diffuse leads No old tracing to compare Confirmed by [id number] (Dione Booze) on 05/14/2020 3:42:41 AM       Medications Ordered in ED Medications  methylPREDNISolone sodium succinate (SOLU-MEDROL) 125 mg/2 mL injection 125 mg (has no administration in time range)  famotidine (PEPCID) IVPB 20 mg premix (has no administration in time range)  diphenhydrAMINE (BENADRYL) injection 25 mg (has no administration in time range)    ED Course  I have reviewed the triage vital signs and the nursing notes.  MDM Rules/Calculators/A&P Recurrent angioedema of the tongue.  Old records reviewed showing prior hospitalizations and ED visits for same complaint.  Review of her medication list shows no medications that are typically associated with angioedema.  At this time, airway is not compromised.  She will be given methylprednisolone, diphenhydramine, famotidine and observed in the ED.  6:12 AM Patient has been observed in the emergency department and has had improvement in her tongue swelling.  Speech is improved.  She is felt to be safe for discharge and is sent home with a short course of prednisone and is referred back to her allergy clinic.  Final Clinical Impression(s) / ED Diagnoses Final diagnoses:  Angioedema, initial encounter  Elevated blood pressure reading with diagnosis of hypertension    Rx / DC Orders ED Discharge Orders         Ordered    EPINEPHrine 0.3 mg/0.3 mL IJ SOAJ injection   Once        05/14/20 0608    predniSONE (DELTASONE) 50 MG tablet  Daily        05/14/20 0608           Dione Booze, MD 05/14/20 [phone]

## 2020-06-01 ENCOUNTER — Ambulatory Visit (INDEPENDENT_AMBULATORY_CARE_PROVIDER_SITE_OTHER): Payer: Medicare Other | Admitting: Allergy & Immunology

## 2020-06-01 ENCOUNTER — Encounter: Payer: Self-pay | Admitting: Allergy & Immunology

## 2020-06-01 ENCOUNTER — Other Ambulatory Visit: Payer: Self-pay

## 2020-06-01 DIAGNOSIS — T783XXD Angioneurotic edema, subsequent encounter: Secondary | ICD-10-CM

## 2020-06-01 NOTE — Progress Notes (Signed)
We received labs from Dr. Scharlene Gloss office. 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 We will send information on alpha gal to the patient. We called the patient to recommend that she avoid all mammalian meats. We can discuss prognosis at future visits. She does have two EpiPens in place.   The remainder of her labs were normal.  C3 and C4 levels were normal.  Serum tryptase was normal.  Rheumatoid factor was negative.  C1 esterase level and function were normal.  Inflammatory markers were normal.  Thyroid antibodies were all negative.  We did send an environmental allergy panel which was completely negative.  We also sent a common food panel which came back positive for pork and beef.  It was also slightly elevated to milk, but I do not think that this is relevant.  I believe the patient already has copies of all of this lab work, but we will send them into our system.  Malachi Bonds, MD [facility]

## 2020-06-01 NOTE — Progress Notes (Signed)
RE: Courtney Gill MRN: [id number] DOB: 1946-06-06 Date of Telemedicine Visit: 06/01/2020  Referring provider: Benita Stabile, MD Primary care provider: Benita Stabile, MD  Chief Complaint: Allergic Rhinitis  (hospitalized twice for anaphalatic reaction, tic bite night before hospitalized.)   Telemedicine Follow Up Visit via Telephone: I connected with Courtney Gill for a follow up on 06/01/20 by telephone and verified that I am speaking with the correct person using two identifiers.   I discussed the limitations, risks, security and privacy concerns of performing an evaluation and management service by telephone and the availability of in person appointments. I also discussed with the patient that there may be a patient responsible charge related to this service. The patient expressed understanding and agreed to proceed.  Patient is at home.  Provider is at the office.  Visit start time: 10:49 AM Visit end time: 11:05 AM Insurance consent/check in by: [facility] consent and medical assistant/nurse: Lachelle  History of Present Illness:  She is a 74 y.o. female, who is being followed for angioedema. Her previous allergy office visit was in August 2021 with myself. At that time, we evaluated for recurrent episodes of angioedema.  At the last visit, we obtained labs relevant serious causes of hives and swelling.  Apparently she got them at her PCPs office. She is asked me to interpret them today, but we never received a copy. Regardless, we recommended using cetirizine twice daily at the first sign of the reactions.   In the interim, she was actually seen in the ED for an episode. She reports that her tongue started swelling.  She did use an EpiPen which did decrease the time she spent in the emergency room.  She still ended up getting methylprednisolone, famotidine, and Benadryl.  She does have 2 more EpiPen's at home.  She is wondering what is causing her reactions.  She is also wondering about her  labs, which I never received.  Her first reaction was in 2014. They have been increasing in frequency since that time. She did get hives when she was much younger, but hives have never been a problem over the last few years. She only has swelling with these episodes.   Otherwise, there have been no changes to her past medical history, surgical history, family history, or social history.  Assessment and Plan:  [name] is a 74 y.o. female with:  Angioedema - with continued episodes  Current smoker   Fully vaccinated to COVID19   We are going to start cetirizine once daily to see if this suppresses her episodes of angioedema. I am also going to reach out to her PCP to get copies of the labs to make sure that everything was normal. Patient has EpiPen in place in case this happens again. She will call us with an update if needed. Otherwise we will see her in March 2021.   Diagnostics: None.  Medication List:  Current Outpatient Medications  Medication Sig Dispense Refill  . Biotin 1000 MCG tablet Take 1,000 mcg by mouth daily.    . cetirizine (ZYRTEC) 10 MG tablet Take 10 mg by mouth daily.    . Cholecalciferol (VITAMIN D PO) Take 5,000 Units by mouth daily.    . clopidogrel (PLAVIX) 75 MG tablet Take 1 tablet (75 mg total) by mouth daily with breakfast. 30 tablet 3  . diphenhydrAMINE (BENADRYL) 50 MG capsule Take 1 capsule (50 mg total) by mouth every 6 (six) hours as needed for itching or allergies.  30 capsule 0  . ibuprofen (ADVIL,MOTRIN) 200 MG tablet Take 200 mg by mouth every 6 (six) hours as needed.    . Omega-3 Fatty Acids (OMEGA-3 FISH OIL PO) Take by mouth.    . pantoprazole (PROTONIX) 40 MG tablet Take 40 mg by mouth daily.    . rosuvastatin (CRESTOR) 10 MG tablet     . vitamin E 400 UNIT capsule Take 400 Units by mouth daily.     No current facility-administered medications for this visit.   Allergies: Allergies  Allergen Reactions  . Aspirin Swelling   I reviewed her  past medical history, social history, family history, and environmental history and no significant changes have been reported from previous visits.  Review of Systems  Constitutional: Negative for activity change and appetite change.  HENT: Negative for congestion, postnasal drip, rhinorrhea, sinus pressure and sore throat.   Eyes: Negative for pain, discharge, redness and itching.  Respiratory: Negative for shortness of breath, wheezing and stridor.   Gastrointestinal: Negative for diarrhea, nausea and vomiting.  Musculoskeletal: Negative for arthralgias, joint swelling and myalgias.  Skin: Negative for rash.  Allergic/Immunologic: Negative for environmental allergies and food allergies.    Objective:  Physical exam not obtained as encounter was done via telephone.   Previous notes and tests were reviewed.  I discussed the assessment and treatment plan with the patient. The patient was provided an opportunity to ask questions and all were answered. The patient agreed with the plan and demonstrated an understanding of the instructions.   The patient was advised to call back or seek an in-person evaluation if the symptoms worsen or if the condition fails to improve as anticipated.  I provided 16 minutes of non-face-to-face time during this encounter.  It was my pleasure to participate in Pulcifer Tuckerman's care today. Please feel free to contact me with any questions or concerns.   Sincerely,  Alfonse Spruce, MD

## 2020-08-07 ENCOUNTER — Encounter: Payer: Self-pay | Admitting: Emergency Medicine

## 2020-08-07 ENCOUNTER — Ambulatory Visit (INDEPENDENT_AMBULATORY_CARE_PROVIDER_SITE_OTHER): Payer: Medicare Other

## 2020-08-07 ENCOUNTER — Other Ambulatory Visit: Payer: Self-pay

## 2020-08-07 ENCOUNTER — Ambulatory Visit
Admission: EM | Admit: 2020-08-07 | Discharge: 2020-08-07 | Disposition: A | Discharge status: Home / Self Care | Payer: Medicare Other | Attending: Family Medicine | Admitting: Family Medicine

## 2020-08-07 DIAGNOSIS — R5383 Other fatigue: Secondary | ICD-10-CM

## 2020-08-07 DIAGNOSIS — R52 Pain, unspecified: Secondary | ICD-10-CM

## 2020-08-07 DIAGNOSIS — R6883 Chills (without fever): Secondary | ICD-10-CM | POA: Diagnosis not present

## 2020-08-07 DIAGNOSIS — B349 Viral infection, unspecified: Secondary | ICD-10-CM | POA: Diagnosis not present

## 2020-08-07 DIAGNOSIS — R0602 Shortness of breath: Secondary | ICD-10-CM

## 2020-08-07 DIAGNOSIS — R062 Wheezing: Secondary | ICD-10-CM

## 2020-08-07 DIAGNOSIS — R059 Cough, unspecified: Secondary | ICD-10-CM

## 2020-08-07 DIAGNOSIS — R067 Sneezing: Secondary | ICD-10-CM

## 2020-08-07 DIAGNOSIS — Z7689 Persons encountering health services in other specified circumstances: Secondary | ICD-10-CM | POA: Diagnosis not present

## 2020-08-07 MED ORDER — CHERATUSSIN AC 100-10 MG/5ML PO SOLN: 5.0000 mL | Freq: Three times a day (TID) | ORAL | 0 refills | Status: DC | PRN

## 2020-08-07 MED ORDER — PREDNISONE 10 MG (21) PO TBPK
ORAL_TABLET | Freq: Every day | ORAL | 0 refills | Status: DC
Start: 2020-08-07 — End: 2020-08-07

## 2020-08-07 MED ORDER — ALBUTEROL SULFATE (5 MG/ML) 0.5% IN NEBU: 2.5000 mg | INHALATION_SOLUTION | Freq: Four times a day (QID) | RESPIRATORY_TRACT | 12 refills | Status: DC | PRN

## 2020-08-07 MED ORDER — PREDNISONE 10 MG (21) PO TBPK: ORAL_TABLET | Freq: Every day | ORAL | 0 refills | Status: AC

## 2020-08-07 NOTE — ED Triage Notes (Signed)
Cold symptoms and feeling achy, cough

## 2020-08-07 NOTE — Discharge Instructions (Addendum)
Xray today is negative for pneumonia  I have sent in a prednisone taper for you to take for 6 days. 6 tablets on day one, 5 tablets on day two, 4 tablets on day three, 3 tablets on day four, 2 tablets on day five, and 1 tablet on day six.  I have sent in cough syrup for you to take. This medication can make you sleepy. Do not drive while taking this medication.  Your COVID and Flu tests are pending.  You should self quarantine until the test results are back.    Take Tylenol or ibuprofen as needed for fever or discomfort.  Rest and keep yourself hydrated.    Follow-up with your primary care provider if your symptoms are not improving.

## 2020-08-07 NOTE — ED Provider Notes (Signed)
[facility] CARE [facility]ENTER   [id number] 08/07/20 Arrival Time: 1327   CC: COVID symptoms  SUBJECTIVE: History from: patient.  Courtney Gill is a 75 y.o. female who presents with abrupt onset of nasal congestion, PND, body aches and cough for the last 2 days. Denies sick exposure to COVID, flu or strep. Denies recent travel. Has negative history of Covid. Has not completed Covid vaccines. Has not taken OTC medications for this. There are no aggravating or alleviating factors. Denies previous symptoms in the past. Denies fever, sinus pain, rhinorrhea, sore throat, SOB, wheezing, nausea, changes in bowel or bladder habits.    ROS: As per HPI.  All other pertinent ROS negative.     Past Medical History:  Diagnosis Date  . Angioedema July 2014  . GERD (gastroesophageal reflux disease)   . Hypercholesterolemia   . Hypertension   . Migraine   . Peripheral artery disease [facility])    Past Surgical History:  Procedure Laterality Date  . CAROTID STENT Right 2007  . EYE SURGERY    . TONSILLECTOMY     Allergies  Allergen Reactions  . Aspirin Swelling   No current facility-administered medications on file prior to encounter.   Current Outpatient Medications on File Prior to Encounter  Medication Sig Dispense Refill  . Biotin 1000 MCG tablet Take 1,000 mcg by mouth daily.    . cetirizine (ZYRTEC) 10 MG tablet Take 10 mg by mouth daily.    . Cholecalciferol (VITAMIN D PO) Take 5,000 Units by mouth daily.    . clopidogrel (PLAVIX) 75 MG tablet Take 1 tablet (75 mg total) by mouth daily with breakfast. 30 tablet 3  . diphenhydrAMINE (BENADRYL) 50 MG capsule Take 1 capsule (50 mg total) by mouth every 6 (six) hours as needed for itching or allergies. 30 capsule 0  . ibuprofen (ADVIL,MOTRIN) 200 MG tablet Take 200 mg by mouth every 6 (six) hours as needed.    . Omega-3 Fatty Acids (OMEGA-3 FISH OIL PO) Take by mouth.    . pantoprazole (PROTONIX) 40 MG tablet Take 40 mg by mouth daily.    .  rosuvastatin (CRESTOR) 10 MG tablet     . vitamin E 400 UNIT capsule Take 400 Units by mouth daily.     Social History   Socioeconomic History  . Marital status: Divorced    Spouse name: Not on file  . Number of children: Not on file  . Years of education: Not on file  . Highest education level: Not on file  Occupational History  . Not on file  Tobacco Use  . Smoking status: Current Every Day Smoker    Packs/day: 1.00    Types: Cigarettes  . Smokeless tobacco: Never Used  Substance and Sexual Activity  . Alcohol use: No  . Drug use: No  . Sexual activity: Not on file  Other Topics Concern  . Not on file  Social History Narrative   Lives home with husband, drinks caffeine 2 cups daily.  Education HS.  Children 1 son, 4 stepchildren.   Social Determinants of Health   Financial Resource Strain: Not on file  Food Insecurity: Not on file  Transportation Needs: Not on file  Physical Activity: Not on file  Stress: Not on file  Social Connections: Not on file  Intimate Partner Violence: Not on file   Family History  Problem Relation Age of Onset  . Atrial fibrillation Mother   . Diabetes Maternal Grandfather   . Cancer Paternal Grandfather  OBJECTIVE:  Vitals:   08/07/20 1422 08/07/20 1424  BP:  (!) 154/79  Resp:  18  Temp:  98.8 F (37.1 C)  TempSrc:  Oral  SpO2:  95%  Weight: 159 lb (72.1 kg)   Height: 5\' 4"  (1.626 m)      General appearance: alert; appears fatigued, but nontoxic; speaking in full sentences and tolerating own secretions HEENT: NCAT; Ears: EACs clear, TMs pearly gray; Eyes: PERRL.  EOM grossly intact. Sinuses: nontender; Nose: nares patent without rhinorrhea, Throat: oropharynx erythematous, cobblestoning present, tonsils non erythematous or enlarged, uvula midline  Neck: supple without LAD Lungs: unlabored respirations, symmetrical air entry; cough: moderate; no respiratory distress; distant lung sounds, diminished in bilateral bases Heart:  regular rate and rhythm.  Radial pulses 2+ symmetrical bilaterally Skin: warm and dry Psychological: alert and cooperative; normal mood and affect  LABS:  No results found for this or any previous visit (from the past 24 hour(s)).   ASSESSMENT & PLAN:  1. Viral illness   2. Cough   3. Other fatigue   4. Chills   5. Body aches   6. Sneezing     Meds ordered this encounter  Medications  . DISCONTD: predniSONE (STERAPRED UNI-PAK 21 TAB) 10 MG (21) TBPK tablet    Sig: Take by mouth daily for 6 days. Take 6 tablets on day 1, 5 tablets on day 2, 4 tablets on day 3, 3 tablets on day 4, 2 tablets on day 5, 1 tablet on day 6    Dispense:  21 tablet    Refill:  0    Order Specific Question:   Supervising Provider    Answer:   Merrilee Jansky  . DISCONTD: guaiFENesin-codeine (CHERATUSSIN AC) 100-10 MG/5ML syrup    Sig: Take 5 mLs by mouth 3 (three) times daily as needed for cough.    Dispense:  120 mL    Refill:  0    Order Specific Question:   Supervising Provider    Answer:   [id number] Merrilee Jansky  . guaiFENesin-codeine (CHERATUSSIN AC) 100-10 MG/5ML syrup    Sig: Take 5 mLs by mouth 3 (three) times daily as needed for cough.    Dispense:  120 mL    Refill:  0    Please deliver    Order Specific Question:   Supervising Provider    Answer:   [id number] Merrilee Jansky  . predniSONE (STERAPRED UNI-PAK 21 TAB) 10 MG (21) TBPK tablet    Sig: Take by mouth daily for 6 days. Take 6 tablets on day 1, 5 tablets on day 2, 4 tablets on day 3, 3 tablets on day 4, 2 tablets on day 5, 1 tablet on day 6    Dispense:  21 tablet    Refill:  0    Please deliver    Order Specific Question:   Supervising Provider    Answer:   [id number] Merrilee Jansky  . albuterol (PROVENTIL) (5 MG/ML) 0.5% nebulizer solution    Sig: Take 0.5 mLs (2.5 mg total) by nebulization every 6 (six) hours as needed for wheezing or shortness of breath.    Dispense:  20 mL    Refill:  12    Please  deliver    Order Specific Question:   Supervising Provider    Answer:   [id number] Merrilee Jansky   Refilled albuterol for nebulizer Prescribed steroid taper Prescribed Chertussin for cough Chest xray negative for pneumonia today Continue supportive care  at home COVID and flu testing ordered.  It will take between 1-2 days for test results.  Someone will contact you regarding abnormal results.   Patient should remain in quarantine until they have received Covid results.  If negative you may resume normal activities (go back to work/school) while practicing hand hygiene, social distance, and mask wearing.  If positive, patient should remain in quarantine for 10 days from symptom onset AND greater than 72 hours after symptoms resolution (absence of fever without the use of fever-reducing medication and improvement in respiratory symptoms), whichever is longer Get plenty of rest and push fluids Use OTC zyrtec for nasal congestion, runny nose, and/or sore throat Use OTC flonase for nasal congestion and runny nose Use medications daily for symptom relief Use OTC medications like ibuprofen or tylenol as needed fever or pain Call or go to the ED if you have any new or worsening symptoms such as fever, worsening cough, shortness of breath, chest tightness, chest pain, turning blue, changes in mental status.  Reviewed expectations re: course of current medical issues. Questions answered. Outlined signs and symptoms indicating need for more acute intervention. Patient verbalized understanding. After Visit Summary given.         Faustino Congress, NP 08/11/20 [phone]

## 2020-08-08 LAB — COVID-19, FLU A+B NAA
Influenza A, NAA: NOT DETECTED
Influenza B, NAA: NOT DETECTED
SARS-CoV-2, NAA: NOT DETECTED

## 2020-10-01 DIAGNOSIS — J060 Acute laryngopharyngitis: Secondary | ICD-10-CM | POA: Diagnosis not present

## 2020-10-01 DIAGNOSIS — K219 Gastro-esophageal reflux disease without esophagitis: Secondary | ICD-10-CM | POA: Diagnosis not present

## 2020-10-03 ENCOUNTER — Other Ambulatory Visit: Payer: Self-pay

## 2020-10-03 ENCOUNTER — Ambulatory Visit: Payer: Medicare Other | Admitting: Allergy & Immunology

## 2020-10-03 ENCOUNTER — Encounter: Payer: Self-pay | Admitting: Allergy & Immunology

## 2020-10-03 VITALS — BP 118/70 | HR 84 | Resp 18 | Ht 64.0 in | Wt 161.0 lb

## 2020-10-03 DIAGNOSIS — T7800XD Anaphylactic reaction due to unspecified food, subsequent encounter: Secondary | ICD-10-CM

## 2020-10-03 DIAGNOSIS — T783XXD Angioneurotic edema, subsequent encounter: Secondary | ICD-10-CM | POA: Diagnosis not present

## 2020-10-03 NOTE — Progress Notes (Signed)
FOLLOW UP  Date of Service/Encounter:  10/03/20   Assessment:   Angioedema - with continued episodes  Current smoker   Fully vaccinated to COVID19  Plan/Recommendations:   1. Angioedema with alpha gal syndrome - Continue to avoid red meats.  - We will plan to retest your alpha gal levels at the next visit. - It seems that you have a good handle on your symptoms.  2. Right cerumen impaction  - Mix hydrogen peroxide and water together and squirt into your ear to break things up. - This is 1-2 times daily.  - You can use some force.   3. Return in about 6 months (around 04/05/2021).   Subjective:   Courtney Gill is a 75 y.o. female presenting today for follow up of  Chief Complaint  Patient presents with  . Angioedema    Doing okay so far. She says that she is kind of tired of only eating chicken, [place], fish and pasta. She is avoiding all mammalian meats. No issues with reactions.     Courtney Gill has a history of the following: Patient Active Problem List   Diagnosis Date Noted  . 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 Loud snoring 06/02/2018  . PAD (peripheral artery disease) (HCC) 06/02/2018  . Psychophysiological insomnia 06/02/2018  . Angioedema 02/28/2013  . HTN (hypertension) 02/28/2013  . Hyperlipidemia 02/28/2013    History obtained from: chart review and patient.  Courtney Gill is a 75 y.o. female presenting for a follow up visit.  She was last seen in October 2021.  At that time, we decided to start cetirizine daily to see if this can suppress her episodes of angioedema.  We did obtain labs from her PCPs office.  She had a positive alpha gal panel with an IgE of 0.68 to be and an IgE of 2.96 alpha gal itself.  We sent her information on alpha gal syndrome.  We also made sure she had EpiPen's in place.  The remainder of her labs are normal including C3 and C4, tryptase, rheumatoid factor, C1 esterase level, C1 esterase function, inflammatory markers, and  thyroid antibody.  Environmental allergy panel was negative and a common food panel came back positive only for beef and pork is a slight elevation to milk.  Since last visit, she has mostly done well. She is frustrated about her internet situation.  Apparently [employer] is having some service issues in her area and she has no phone service, cable service, or Internet service at her house.  She is rather angry about this.  Her swelling episodes are improved. The last time that she had one was in the fall or so. It was three separate times that she was in the hospital. She has been on cetirizine daily which is controlling her episodes of angioedema and urticaria.  She is pleased that she has not been to the emergency room since I saw her, at least for her angioedema episodes.  She can tolerate cow's milk without a problem.  Otherwise, there have been no changes to her past medical history, surgical history, family history, or social history.    Review of Systems  Constitutional: Negative.  Negative for chills, fever, malaise/fatigue and weight loss.  HENT: Negative.  Negative for congestion, ear discharge and ear pain.   Eyes: Negative for pain, discharge and redness.  Respiratory: Negative for cough, sputum production, shortness of breath and wheezing.   Cardiovascular: Negative.  Negative for chest pain and palpitations.  Gastrointestinal:  Negative for abdominal pain, constipation, diarrhea, heartburn, nausea and vomiting.  Skin: Negative.  Negative for itching and rash.  Neurological: Negative for dizziness and headaches.  Endo/Heme/Allergies: Negative for environmental allergies. Does not bruise/bleed easily.  All other systems reviewed and are negative.      Objective:   Blood pressure 118/70, pulse 84, resp. rate 18, height 5\' 4"  (1.626 m), weight 161 lb (73 kg), SpO2 95 %. Body mass index is 27.64 kg/m.   Physical Exam:  Physical Exam Constitutional:      Appearance: She is  well-developed.  HENT:     Head: Normocephalic and atraumatic.     Right Ear: Tympanic membrane, ear canal and external ear normal.     Left Ear: Tympanic membrane and ear canal normal.     Nose: No nasal deformity, septal deviation, mucosal edema, rhinorrhea or epistaxis.     Right Sinus: No maxillary sinus tenderness or frontal sinus tenderness.     Left Sinus: No maxillary sinus tenderness or frontal sinus tenderness.     Mouth/Throat:     Mouth: Oropharynx is clear and moist. Mucous membranes are not pale and not dry.     Pharynx: Uvula midline.  Eyes:     General:        Right eye: No discharge.        Left eye: No discharge.     Extraocular Movements: EOM normal.     Conjunctiva/sclera: Conjunctivae normal.     Right eye: Right conjunctiva is not injected. No chemosis.    Left eye: Left conjunctiva is not injected. No chemosis.    Pupils: Pupils are equal, round, and reactive to light.  Cardiovascular:     Rate and Rhythm: Normal rate and regular rhythm.     Heart sounds: Normal heart sounds.  Pulmonary:     Effort: Pulmonary effort is normal. No tachypnea, accessory muscle usage or respiratory distress.     Breath sounds: Normal breath sounds. No wheezing, rhonchi or rales.  Chest:     Chest wall: No tenderness.  Lymphadenopathy:     Cervical: No cervical adenopathy.  Skin:    Coloration: Skin is not pale.     Findings: No abrasion, erythema, petechiae or rash. Rash is not papular, urticarial or vesicular.  Neurological:     Mental Status: She is alert.  Psychiatric:        Mood and Affect: Mood and affect normal.      Diagnostic studies: none       , MD  [facility]

## 2020-10-03 NOTE — Patient Instructions (Addendum)
1. Angioedema with alpha gal syndrome - Continue to avoid red meats.  - We will plan to retest your alpha gal levels at the next visit. - It seems that you have a good handle on your symptoms.  2. Right cerumen impaction  - Mix hydrogen peroxide and water together and squirt into your ear to break things up. - This is 1-2 times daily.  - You can use some force.   3. Return in about 6 months (around 04/05/2021).    Please inform us of any Emergency Department visits, hospitalizations, or changes in symptoms. Call us before going to the ED for breathing or allergy symptoms since we might be able to fit you in for a sick visit. Feel free to contact us anytime with any questions, problems, or concerns.  It was a pleasure to see you again today!  Websites that have reliable patient information: 1. American Academy of Asthma, Allergy, and Immunology: www.aaaai.org 2. Food Allergy Research and Education (FARE): foodallergy.org 3. Mothers of Asthmatics: http://www.asthmacommunitynetwork.org 4. American College of Allergy, Asthma, and Immunology: www.acaai.org   COVID-19 Vaccine Information can be found at: [email or web address].nl For questions related to vaccine distribution or appointments, please email [email or web address] or call [phone].   We realize that you might be concerned about having an allergic reaction to the COVID19 vaccines. To help with that concern, WE ARE OFFERING THE COVID19 VACCINES IN OUR OFFICE! Ask the front desk for dates!     "Like" [place] on Facebook and Instagram for our latest updates!      A healthy democracy works best when [employer] participate! Make sure you are registered to vote! If you have moved or changed any of your contact information, you will need to get this updated before voting!  In some cases, you MAY be able to register to vote online:  [email or web address]

## 2020-10-05 ENCOUNTER — Encounter: Payer: Self-pay | Admitting: Allergy & Immunology

## 2020-11-26 DIAGNOSIS — R7303 Prediabetes: Secondary | ICD-10-CM | POA: Diagnosis not present

## 2020-11-26 DIAGNOSIS — Z131 Encounter for screening for diabetes mellitus: Secondary | ICD-10-CM | POA: Diagnosis not present

## 2020-11-26 DIAGNOSIS — G4700 Insomnia, unspecified: Secondary | ICD-10-CM | POA: Diagnosis not present

## 2020-11-26 DIAGNOSIS — E782 Mixed hyperlipidemia: Secondary | ICD-10-CM | POA: Diagnosis not present

## 2020-11-29 DIAGNOSIS — F17210 Nicotine dependence, cigarettes, uncomplicated: Secondary | ICD-10-CM | POA: Diagnosis not present

## 2020-11-29 DIAGNOSIS — N182 Chronic kidney disease, stage 2 (mild): Secondary | ICD-10-CM | POA: Diagnosis not present

## 2020-11-29 DIAGNOSIS — E782 Mixed hyperlipidemia: Secondary | ICD-10-CM | POA: Diagnosis not present

## 2020-11-29 DIAGNOSIS — G4700 Insomnia, unspecified: Secondary | ICD-10-CM | POA: Diagnosis not present

## 2020-11-29 DIAGNOSIS — L299 Pruritus, unspecified: Secondary | ICD-10-CM | POA: Diagnosis not present

## 2020-11-29 DIAGNOSIS — K219 Gastro-esophageal reflux disease without esophagitis: Secondary | ICD-10-CM | POA: Diagnosis not present

## 2020-11-29 DIAGNOSIS — Z713 Dietary counseling and surveillance: Secondary | ICD-10-CM | POA: Diagnosis not present

## 2020-11-29 DIAGNOSIS — I25119 Atherosclerotic heart disease of native coronary artery with unspecified angina pectoris: Secondary | ICD-10-CM | POA: Diagnosis not present

## 2020-11-29 DIAGNOSIS — R197 Diarrhea, unspecified: Secondary | ICD-10-CM | POA: Diagnosis not present

## 2020-11-29 DIAGNOSIS — T7800XA Anaphylactic reaction due to unspecified food, initial encounter: Secondary | ICD-10-CM | POA: Diagnosis not present

## 2020-12-02 DIAGNOSIS — K219 Gastro-esophageal reflux disease without esophagitis: Secondary | ICD-10-CM | POA: Diagnosis not present

## 2020-12-02 DIAGNOSIS — I2510 Atherosclerotic heart disease of native coronary artery without angina pectoris: Secondary | ICD-10-CM | POA: Diagnosis not present

## 2020-12-24 DIAGNOSIS — H25811 Combined forms of age-related cataract, right eye: Secondary | ICD-10-CM | POA: Diagnosis not present

## 2020-12-24 DIAGNOSIS — Z961 Presence of intraocular lens: Secondary | ICD-10-CM | POA: Diagnosis not present

## 2020-12-24 DIAGNOSIS — H509 Unspecified strabismus: Secondary | ICD-10-CM | POA: Diagnosis not present

## 2021-01-11 DIAGNOSIS — K219 Gastro-esophageal reflux disease without esophagitis: Secondary | ICD-10-CM | POA: Diagnosis not present

## 2021-01-11 DIAGNOSIS — I2510 Atherosclerotic heart disease of native coronary artery without angina pectoris: Secondary | ICD-10-CM | POA: Diagnosis not present

## 2021-03-03 DIAGNOSIS — I2510 Atherosclerotic heart disease of native coronary artery without angina pectoris: Secondary | ICD-10-CM | POA: Diagnosis not present

## 2021-03-03 DIAGNOSIS — K219 Gastro-esophageal reflux disease without esophagitis: Secondary | ICD-10-CM | POA: Diagnosis not present

## 2021-04-05 ENCOUNTER — Ambulatory Visit: Payer: Medicare Other | Admitting: Allergy & Immunology

## 2021-04-05 ENCOUNTER — Other Ambulatory Visit: Payer: Self-pay

## 2021-04-05 VITALS — BP 138/82 | HR 83 | Temp 98.5°F | Resp 16 | Ht 63.0 in | Wt 166.6 lb

## 2021-04-05 DIAGNOSIS — W57XXXD Bitten or stung by nonvenomous insect and other nonvenomous arthropods, subsequent encounter: Secondary | ICD-10-CM

## 2021-04-05 DIAGNOSIS — T7800XD Anaphylactic reaction due to unspecified food, subsequent encounter: Secondary | ICD-10-CM | POA: Diagnosis not present

## 2021-04-05 MED ORDER — EPINEPHRINE 0.3 MG/0.3ML IJ SOAJ
0.3000 mg | Freq: Once | INTRAMUSCULAR | 2 refills | Status: AC
Start: 2021-04-05 — End: 2021-04-05

## 2021-04-05 NOTE — Patient Instructions (Addendum)
1. Angioedema with alpha gal syndrome - Continue to avoid red meats.  - Repeat alpha gal panel ordered today.  - Look up GalSafe pork which is genetically modified to be free of alpha gal.  - We will send in an EpiPen refill to be kept at your pharmacy.   2. Return in about 1 year (around 04/05/2022).    Please inform us of any Emergency Department visits, hospitalizations, or changes in symptoms. Call us before going to the ED for breathing or allergy symptoms since we might be able to fit you in for a sick visit. Feel free to contact us anytime with any questions, problems, or concerns.  It was a pleasure to see you again today!  Websites that have reliable patient information: 1. American Academy of Asthma, Allergy, and Immunology: www.aaaai.org 2. Food Allergy Research and Education (FARE): foodallergy.org 3. Mothers of Asthmatics: http://www.asthmacommunitynetwork.org 4. American College of Allergy, Asthma, and Immunology: www.acaai.org   COVID-19 Vaccine Information can be found at: [email or web address].nl For questions related to vaccine distribution or appointments, please email [email or web address] or call [phone].   We realize that you might be concerned about having an allergic reaction to the COVID19 vaccines. To help with that concern, WE ARE OFFERING THE COVID19 VACCINES IN OUR OFFICE! Ask the front desk for dates!     "Like" [place] on Facebook and Instagram for our latest updates!      A healthy democracy works best when [employer] participate! Make sure you are registered to vote! If you have moved or changed any of your contact information, you will need to get this updated before voting!  In some cases, you MAY be able to register to vote online: [email or web address]

## 2021-04-05 NOTE — Progress Notes (Signed)
FOLLOW UP  Date of Service/Encounter:  04/05/21   Assessment:   Angioedema - improved with avoidance of mammalian meat   Current smoker    Fully vaccinated to COVID19  Plan/Recommendations:   1. Angioedema with alpha gal syndrome - Continue to avoid red meats.  - Repeat alpha gal panel ordered today.  - Look up GalSafe pork which is genetically modified to be free of alpha gal.  - We will send in an EpiPen refill to be kept at your pharmacy.   2. Return in about 1 year (around 04/05/2022).   Subjective:   Courtney Gill is a 75 y.o. female presenting today for follow up of  Chief Complaint  Patient presents with   Allergies    States everything is well.    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  Courtney Gill is a 75 y.o. female presenting for a follow up visit. She was last seen in March 2022. At that time, we continued with avoidance of mammalian meats and discussed re-testing alpha gal at this upcoming visit. She did have a right cerumen impaction, we recommended using hydrogen peroxide and water together to help with breaking up the cerumen impaction.  Since the last visit, she has done well. She has not had any episodes while avoiding all mammalian meats. She does have an EpiPen in place. She has not had any accidental ingestions at all. She is interested in getting repeat alpha gal testing.   We did obtain labs from her PCPs office.  She had a positive alpha gal panel with an IgE of 0.68 to be and an IgE of 2.96 alpha gal itself.  We sent her information on alpha gal syndrome.  We also made sure she had EpiPen's in place.   The remainder of her labs are normal including C3 and C4, tryptase, rheumatoid factor, C1 esterase level, C1 esterase function, inflammatory markers, and thyroid antibody.  Environmental allergy panel was negative and a common food panel came back positive only for beef and pork is a slight elevation to milk.  Otherwise, there have been no changes to her past medical history, surgical history, family history, or social history.    Review of Systems  Constitutional: Negative.  Negative for chills, fever, malaise/fatigue and weight loss.  HENT:  Negative for congestion, ear discharge, ear pain and sinus pain.   Eyes:  Negative for pain, discharge and redness.  Respiratory:  Negative for cough, sputum production, shortness of breath and wheezing.   Cardiovascular: Negative.  Negative for chest pain and palpitations.  Gastrointestinal:  Negative for abdominal pain, constipation, diarrhea, heartburn, nausea and vomiting.  Skin: Negative.  Negative for itching and rash.  Neurological:  Negative for dizziness and headaches.  Endo/Heme/Allergies:  Negative for environmental allergies. Does not bruise/bleed easily.      Objective:   Blood pressure 138/82, pulse 83, temperature 98.5 F (36.9 C), temperature source Temporal, resp. rate 16, height 5\' 3"  (1.6 m), weight 166 lb 9.6 oz (75.6 kg), SpO2 96 %. Body mass index is 29.51 kg/m.   Physical Exam:  Physical Exam Vitals reviewed.  Constitutional:  Appearance: She is well-developed.  HENT:     Head: Normocephalic and atraumatic.     Right Ear: Tympanic membrane, ear canal and external ear normal.     Left Ear: Tympanic membrane, ear canal and external ear normal.     Nose: No nasal deformity, septal deviation, mucosal edema or rhinorrhea.     Right Turbinates: Not enlarged or swollen.     Left Turbinates: Not enlarged or swollen.     Right Sinus: No maxillary sinus tenderness or frontal sinus tenderness.     Left Sinus: No  maxillary sinus tenderness or frontal sinus tenderness.     Mouth/Throat:     Mouth: Mucous membranes are not pale and not dry.     Pharynx: Uvula midline.  Eyes:     General: Lids are normal. No allergic shiner.       Right eye: No discharge.        Left eye: No discharge.     Conjunctiva/sclera: Conjunctivae normal.     Right eye: Right conjunctiva is not injected. No chemosis.    Left eye: Left conjunctiva is not injected. No chemosis.    Pupils: Pupils are equal, round, and reactive to light.  Cardiovascular:     Rate and Rhythm: Normal rate and regular rhythm.     Heart sounds: Normal heart sounds.  Pulmonary:     Effort: Pulmonary effort is normal. No tachypnea, accessory muscle usage or respiratory distress.     Breath sounds: Normal breath sounds. No wheezing, rhonchi or rales.  Chest:     Chest wall: No tenderness.  Lymphadenopathy:     Cervical: No cervical adenopathy.  Skin:    Coloration: Skin is not pale.     Findings: No abrasion, erythema, petechiae or rash. Rash is not papular, urticarial or vesicular.  Neurological:     Mental Status: She is alert.  Psychiatric:        Behavior: Behavior is cooperative.     Diagnostic studies: labs sent instead        Malachi Bonds, MD  [facility]

## 2021-04-08 ENCOUNTER — Encounter: Payer: Self-pay | Admitting: Allergy & Immunology

## 2021-05-03 DIAGNOSIS — K219 Gastro-esophageal reflux disease without esophagitis: Secondary | ICD-10-CM | POA: Diagnosis not present

## 2021-05-03 DIAGNOSIS — I2510 Atherosclerotic heart disease of native coronary artery without angina pectoris: Secondary | ICD-10-CM | POA: Diagnosis not present

## 2021-05-30 ENCOUNTER — Telehealth: Payer: Self-pay

## 2021-05-30 DIAGNOSIS — T7800XD Anaphylactic reaction due to unspecified food, subsequent encounter: Secondary | ICD-10-CM | POA: Diagnosis not present

## 2021-05-30 DIAGNOSIS — E782 Mixed hyperlipidemia: Secondary | ICD-10-CM | POA: Diagnosis not present

## 2021-05-30 NOTE — Telephone Encounter (Signed)
Patient called stating she tried to get her labs done at her PCP this morning as they have [facility] in the office. She was told by the lab tech that she couldn't draw the Lyme Disease as that is not a lab draw.  Please Advise

## 2021-05-31 NOTE — Telephone Encounter (Signed)
She should probably just go to [facility] then.   Malachi Bonds, MD [facility]

## 2021-05-31 NOTE — Telephone Encounter (Signed)
Spoke to patient and informed her to take the lab requisition to a regular [facility] and they should know what to draw.

## 2021-06-04 DIAGNOSIS — R011 Cardiac murmur, unspecified: Secondary | ICD-10-CM | POA: Diagnosis not present

## 2021-06-04 DIAGNOSIS — Z0001 Encounter for general adult medical examination with abnormal findings: Secondary | ICD-10-CM | POA: Diagnosis not present

## 2021-06-04 DIAGNOSIS — R059 Cough, unspecified: Secondary | ICD-10-CM | POA: Diagnosis not present

## 2021-06-04 DIAGNOSIS — F17200 Nicotine dependence, unspecified, uncomplicated: Secondary | ICD-10-CM | POA: Diagnosis not present

## 2021-06-04 DIAGNOSIS — R197 Diarrhea, unspecified: Secondary | ICD-10-CM | POA: Diagnosis not present

## 2021-06-06 ENCOUNTER — Other Ambulatory Visit (HOSPITAL_COMMUNITY): Payer: Self-pay | Admitting: Family Medicine

## 2021-06-06 DIAGNOSIS — R011 Cardiac murmur, unspecified: Secondary | ICD-10-CM

## 2021-06-06 LAB — ALPHA-GAL PANEL
Allergen Lamb IgE: <0.1 kU/L
Beef IgE: 0.12 kU/L — AB
IgE (Immunoglobulin E), Serum: 30 IU/mL (ref 6–495)
O215-IgE Alpha-Gal: 0.38 kU/L — AB
Pork IgE: <0.1 kU/L

## 2021-06-06 NOTE — Progress Notes (Signed)
MyChart message sent. Can someone reach out to schedule a beef challenge in [place]? She can season the beef with anything that she has tolerated in the past.   Malachi Bonds, MD [facility]

## 2021-06-20 ENCOUNTER — Telehealth: Payer: Self-pay

## 2021-06-20 NOTE — Addendum Note (Signed)
Addended by: Alfonse Spruce on: 06/20/2021 02:16 PM   Modules accepted: Orders

## 2021-06-20 NOTE — Telephone Encounter (Signed)
Patient called today 06/20/21 to go over lab results. She said she had several messages about a beef challenge. I looked at the results and I do see we left her two messages about a beef challenge, but there is no note from Dr. Dellis Anes about her alpha gal results.    She also tried to get her blood drawn on Monday and went to two [facility] offices that told her the Lymes Disease Abs test is a spinal test. I plan to call Boykin Reaper today to make sure we have the right test so she can pick it up from the [place] office tomorrow 06/21/21.

## 2021-06-20 NOTE — Telephone Encounter (Signed)
Thanks for catching that Diandra!  Alpha gal levels look good.  I hope she passes her a challenge.  Malachi Bonds, MD [facility]

## 2021-06-20 NOTE — Telephone Encounter (Signed)
Called patient she plans to stop by the [place] office tomorrow 06/21/21 to pick up the requisition for the lyme disease bloodwork. She would like for clarification on the beef challenge she was called about Please advise. I do not see a note from Dr. Dellis Anes regarding her lab results for Alpha-Gal.

## 2021-06-20 NOTE — Telephone Encounter (Signed)
I went over the beef challenge with the patient. She is not really sure if she wants to do the challenge. She would like to talk with Dr. Dellis Anes about it a little more when she comes in tomorrow to pick up her lab requisition in [place] on 06/21/21.

## 2021-06-21 NOTE — Telephone Encounter (Signed)
Patient came in to pick up lab requisitions. Patient spoke to [facility] about beef challenge. Patient scheduled beef challenge in January.

## 2021-06-21 NOTE — Telephone Encounter (Signed)
Protocol for Beef challenge has been mailed out.

## 2021-07-23 ENCOUNTER — Other Ambulatory Visit: Payer: Self-pay

## 2021-07-23 ENCOUNTER — Ambulatory Visit (HOSPITAL_COMMUNITY)
Admission: RE | Admit: 2021-07-23 | Discharge: 2021-07-23 | Disposition: A | Discharge status: Home / Self Care | Payer: Medicare Other | Source: Ambulatory Visit | Attending: Internal Medicine | Admitting: Internal Medicine

## 2021-07-23 DIAGNOSIS — R011 Cardiac murmur, unspecified: Secondary | ICD-10-CM | POA: Diagnosis not present

## 2021-07-23 DIAGNOSIS — I10 Essential (primary) hypertension: Secondary | ICD-10-CM | POA: Diagnosis not present

## 2021-07-23 DIAGNOSIS — I739 Peripheral vascular disease, unspecified: Secondary | ICD-10-CM | POA: Diagnosis not present

## 2021-07-23 DIAGNOSIS — I083 Combined rheumatic disorders of mitral, aortic and tricuspid valves: Secondary | ICD-10-CM | POA: Diagnosis not present

## 2021-07-23 DIAGNOSIS — F17200 Nicotine dependence, unspecified, uncomplicated: Secondary | ICD-10-CM | POA: Insufficient documentation

## 2021-07-23 LAB — ECHOCARDIOGRAM COMPLETE
AR max vel: 2.46 cm2
AV Area VTI: 2.64 cm2
AV Area mean vel: 2.41 cm2
AV Mean grad: 12 mmHg
AV Peak grad: 19.3 mmHg
Ao pk vel: 2.2 m/s
Area-P 1/2: 2.28 cm2
MV VTI: 3.76 cm2
S' Lateral: 3.7 cm

## 2021-07-23 NOTE — Progress Notes (Signed)
°  Echocardiogram 2D Echocardiogram has been performed.  Gerda Diss 07/23/2021, 1:51 PM

## 2021-08-02 DIAGNOSIS — I10 Essential (primary) hypertension: Secondary | ICD-10-CM | POA: Diagnosis not present

## 2021-08-02 DIAGNOSIS — E782 Mixed hyperlipidemia: Secondary | ICD-10-CM | POA: Diagnosis not present

## 2021-08-21 ENCOUNTER — Encounter: Payer: Medicare Other | Admitting: Family

## 2021-11-26 DIAGNOSIS — E782 Mixed hyperlipidemia: Secondary | ICD-10-CM | POA: Diagnosis not present

## 2021-11-26 DIAGNOSIS — N183 Chronic kidney disease, stage 3 unspecified: Secondary | ICD-10-CM | POA: Diagnosis not present

## 2021-11-29 ENCOUNTER — Other Ambulatory Visit (HOSPITAL_COMMUNITY): Payer: Self-pay | Admitting: Family Medicine

## 2021-11-29 DIAGNOSIS — Z13820 Encounter for screening for osteoporosis: Secondary | ICD-10-CM

## 2021-11-29 DIAGNOSIS — Z1231 Encounter for screening mammogram for malignant neoplasm of breast: Secondary | ICD-10-CM

## 2021-12-02 DIAGNOSIS — F17200 Nicotine dependence, unspecified, uncomplicated: Secondary | ICD-10-CM | POA: Diagnosis not present

## 2021-12-02 DIAGNOSIS — R197 Diarrhea, unspecified: Secondary | ICD-10-CM | POA: Diagnosis not present

## 2021-12-02 DIAGNOSIS — N183 Chronic kidney disease, stage 3 unspecified: Secondary | ICD-10-CM | POA: Diagnosis not present

## 2021-12-02 DIAGNOSIS — H6121 Impacted cerumen, right ear: Secondary | ICD-10-CM | POA: Diagnosis not present

## 2021-12-02 DIAGNOSIS — Z6830 Body mass index (BMI) 30.0-30.9, adult: Secondary | ICD-10-CM | POA: Diagnosis not present

## 2021-12-02 DIAGNOSIS — E782 Mixed hyperlipidemia: Secondary | ICD-10-CM | POA: Diagnosis not present

## 2021-12-02 DIAGNOSIS — R011 Cardiac murmur, unspecified: Secondary | ICD-10-CM | POA: Diagnosis not present

## 2021-12-02 DIAGNOSIS — E669 Obesity, unspecified: Secondary | ICD-10-CM | POA: Diagnosis not present

## 2021-12-03 ENCOUNTER — Other Ambulatory Visit: Payer: Self-pay | Admitting: Family Medicine

## 2021-12-03 DIAGNOSIS — F17200 Nicotine dependence, unspecified, uncomplicated: Secondary | ICD-10-CM

## 2021-12-11 ENCOUNTER — Ambulatory Visit (HOSPITAL_COMMUNITY)
Admission: RE | Admit: 2021-12-11 | Discharge: 2021-12-11 | Disposition: A | Discharge status: Home / Self Care | Payer: Medicare PPO | Source: Ambulatory Visit | Attending: Family Medicine | Admitting: Family Medicine

## 2021-12-11 DIAGNOSIS — Z13820 Encounter for screening for osteoporosis: Secondary | ICD-10-CM

## 2021-12-11 DIAGNOSIS — Z780 Asymptomatic menopausal state: Secondary | ICD-10-CM | POA: Insufficient documentation

## 2021-12-11 DIAGNOSIS — Z1231 Encounter for screening mammogram for malignant neoplasm of breast: Secondary | ICD-10-CM | POA: Insufficient documentation

## 2021-12-11 DIAGNOSIS — F17200 Nicotine dependence, unspecified, uncomplicated: Secondary | ICD-10-CM | POA: Diagnosis not present

## 2021-12-11 DIAGNOSIS — M8589 Other specified disorders of bone density and structure, multiple sites: Secondary | ICD-10-CM | POA: Insufficient documentation

## 2021-12-31 DIAGNOSIS — H25811 Combined forms of age-related cataract, right eye: Secondary | ICD-10-CM | POA: Diagnosis not present

## 2021-12-31 DIAGNOSIS — H02831 Dermatochalasis of right upper eyelid: Secondary | ICD-10-CM | POA: Diagnosis not present

## 2022-01-20 ENCOUNTER — Ambulatory Visit (HOSPITAL_COMMUNITY): Admission: RE | Admit: 2022-01-20 | Payer: Medicare PPO | Source: Ambulatory Visit

## 2022-01-20 ENCOUNTER — Encounter (HOSPITAL_COMMUNITY): Payer: Self-pay

## 2022-03-20 ENCOUNTER — Other Ambulatory Visit: Payer: Self-pay | Admitting: Family Medicine

## 2022-03-20 ENCOUNTER — Ambulatory Visit (HOSPITAL_COMMUNITY)
Admission: RE | Admit: 2022-03-20 | Discharge: 2022-03-20 | Disposition: A | Discharge status: Home / Self Care | Payer: Medicare PPO | Source: Ambulatory Visit | Attending: Family Medicine | Admitting: Family Medicine

## 2022-03-20 ENCOUNTER — Other Ambulatory Visit (HOSPITAL_COMMUNITY): Payer: Self-pay | Admitting: Family Medicine

## 2022-03-20 DIAGNOSIS — R0600 Dyspnea, unspecified: Secondary | ICD-10-CM | POA: Diagnosis not present

## 2022-03-20 DIAGNOSIS — R109 Unspecified abdominal pain: Secondary | ICD-10-CM | POA: Diagnosis not present

## 2022-04-16 ENCOUNTER — Ambulatory Visit (HOSPITAL_COMMUNITY)
Admission: RE | Admit: 2022-04-16 | Discharge: 2022-04-16 | Disposition: A | Discharge status: Home / Self Care | Payer: Medicare PPO | Source: Ambulatory Visit | Attending: Nurse Practitioner | Admitting: Nurse Practitioner

## 2022-04-16 ENCOUNTER — Other Ambulatory Visit (HOSPITAL_COMMUNITY): Payer: Self-pay | Admitting: Nurse Practitioner

## 2022-04-16 DIAGNOSIS — R053 Chronic cough: Secondary | ICD-10-CM | POA: Diagnosis not present

## 2022-04-16 DIAGNOSIS — J90 Pleural effusion, not elsewhere classified: Secondary | ICD-10-CM | POA: Diagnosis not present

## 2022-04-16 DIAGNOSIS — R062 Wheezing: Secondary | ICD-10-CM | POA: Diagnosis not present

## 2022-04-16 DIAGNOSIS — R0609 Other forms of dyspnea: Secondary | ICD-10-CM | POA: Diagnosis not present

## 2022-05-28 DIAGNOSIS — E782 Mixed hyperlipidemia: Secondary | ICD-10-CM | POA: Diagnosis not present

## 2022-06-04 DIAGNOSIS — E782 Mixed hyperlipidemia: Secondary | ICD-10-CM | POA: Diagnosis not present

## 2022-06-04 DIAGNOSIS — R197 Diarrhea, unspecified: Secondary | ICD-10-CM | POA: Diagnosis not present

## 2022-06-04 DIAGNOSIS — E860 Dehydration: Secondary | ICD-10-CM | POA: Diagnosis not present

## 2022-06-04 DIAGNOSIS — N183 Chronic kidney disease, stage 3 unspecified: Secondary | ICD-10-CM | POA: Diagnosis not present

## 2022-06-04 DIAGNOSIS — I714 Abdominal aortic aneurysm, without rupture, unspecified: Secondary | ICD-10-CM | POA: Diagnosis not present

## 2022-06-04 DIAGNOSIS — F17200 Nicotine dependence, unspecified, uncomplicated: Secondary | ICD-10-CM | POA: Diagnosis not present

## 2022-06-04 DIAGNOSIS — R0609 Other forms of dyspnea: Secondary | ICD-10-CM | POA: Diagnosis not present

## 2022-06-04 DIAGNOSIS — R5383 Other fatigue: Secondary | ICD-10-CM | POA: Diagnosis not present

## 2022-06-04 DIAGNOSIS — R011 Cardiac murmur, unspecified: Secondary | ICD-10-CM | POA: Diagnosis not present

## 2022-06-05 ENCOUNTER — Other Ambulatory Visit (HOSPITAL_COMMUNITY): Payer: Self-pay | Admitting: Radiology

## 2022-06-05 DIAGNOSIS — R0609 Other forms of dyspnea: Secondary | ICD-10-CM

## 2022-07-01 ENCOUNTER — Ambulatory Visit (HOSPITAL_COMMUNITY)
Admission: RE | Admit: 2022-07-01 | Discharge: 2022-07-01 | Disposition: A | Discharge status: Home / Self Care | Payer: Medicare PPO | Source: Ambulatory Visit | Attending: Family Medicine | Admitting: Family Medicine

## 2022-07-01 DIAGNOSIS — R0609 Other forms of dyspnea: Secondary | ICD-10-CM | POA: Insufficient documentation

## 2022-07-01 LAB — PULMONARY FUNCTION TEST
DL/VA % pred: 97 %
DL/VA: 3.99 ml/min/mmHg/L
DLCO unc % pred: 90 %
DLCO unc: 17.28 ml/min/mmHg
FEF 25-75 Post: 1.16 L/sec
FEF 25-75 Pre: 0.88 L/sec
FEF2575-%Change-Post: 30 %
FEF2575-%Pred-Post: 67 %
FEF2575-%Pred-Pre: 51 %
FEV1-%Change-Post: 20 %
FEV1-%Pred-Post: 83 %
FEV1-%Pred-Pre: 69 %
FEV1-Post: 1.8 L
FEV1-Pre: 1.5 L
FEV1FVC-%Change-Post: 0 %
FEV1FVC-%Pred-Pre: 89 %
FEV6-%Change-Post: 20 %
FEV6-%Pred-Post: 98 %
FEV6-%Pred-Pre: 81 %
FEV6-Post: 2.67 L
FEV6-Pre: 2.22 L
FEV6FVC-%Change-Post: -1 %
FEV6FVC-%Pred-Post: 103 %
FEV6FVC-%Pred-Pre: 105 %
FVC-%Change-Post: 21 %
FVC-%Pred-Post: 94 %
FVC-%Pred-Pre: 78 %
FVC-Post: 2.7 L
FVC-Pre: 2.23 L
Post FEV1/FVC ratio: 67 %
Post FEV6/FVC ratio: 99 %
Pre FEV1/FVC ratio: 67 %
Pre FEV6/FVC Ratio: 100 %
RV % pred: 209 %
RV: 4.75 L
TLC % pred: 138 %
TLC: 7.02 L

## 2022-07-01 MED ORDER — ALBUTEROL SULFATE (2.5 MG/3ML) 0.083% IN NEBU
2.5000 mg | INHALATION_SOLUTION | Freq: Once | RESPIRATORY_TRACT | Status: AC
Start: 2022-07-01 — End: 2022-07-01
  Administered 2022-07-01: 2.5 mg via RESPIRATORY_TRACT

## 2022-08-11 ENCOUNTER — Other Ambulatory Visit: Payer: Self-pay | Admitting: Allergy & Immunology

## 2022-08-18 ENCOUNTER — Ambulatory Visit: Payer: Medicare HMO | Admitting: Internal Medicine

## 2022-08-18 VITALS — BP 102/62 | HR 94 | Temp 97.9°F | Resp 14 | Ht 63.0 in | Wt 163.4 lb

## 2022-08-18 DIAGNOSIS — R053 Chronic cough: Secondary | ICD-10-CM

## 2022-08-18 DIAGNOSIS — R0602 Shortness of breath: Secondary | ICD-10-CM

## 2022-08-18 DIAGNOSIS — T783XXD Angioneurotic edema, subsequent encounter: Secondary | ICD-10-CM

## 2022-08-18 DIAGNOSIS — J449 Chronic obstructive pulmonary disease, unspecified: Secondary | ICD-10-CM

## 2022-08-18 DIAGNOSIS — T7800XD Anaphylactic reaction due to unspecified food, subsequent encounter: Secondary | ICD-10-CM | POA: Diagnosis not present

## 2022-08-18 MED ORDER — EPINEPHRINE 0.3 MG/0.3ML IJ SOAJ: 0.3000 mg | INTRAMUSCULAR | 1 refills | Status: DC | PRN

## 2022-08-18 NOTE — Patient Instructions (Addendum)
Angioedema with alpha gal syndrome - Continue to avoid red meats for now.  Alpha gal level came down significantly on the past lab check in 2022.  There is a challenge set up for hamburger/steak on 09/17/2022 at 8:30 AM.  - Keep Epipen on you at all times.    Shortness of breath/Cough - This is likely COPD, not asthma.  She does not have an atopic history and has smoked since age 77.  Spirometry today show possible obstruction but not the best effort.  - Use Albuterol 2 puffs as needed every 6 hours for wheezing/coughing/shortness of breath.  - Follow up with PCP.  If symptoms worsen, recommend referral to Pulmonology.

## 2022-08-18 NOTE — Progress Notes (Signed)
   FOLLOW UP Date of Service/Encounter:  08/18/22   Subjective:  Courtney Gill (DOB: 1945/10/30) is a 77 y.o. female who [facility]2022 for follow up for alpha gal allergy.   History obtained from: chart review and patient.  Reports having some shortness of breath with activity and chronic cough.  She was told it could be asthma.  Reports smoking since age 32, highest was over 1 ppd and now down to 1/2 to 3/4 ppd.  She is not interested in quitting.  She has an albuterol inhaler to use PRN which she rarely does. Denies need oral prednisone for her breathing.   Still avoiding red meat, no reactions since last visit. No accidental Epipen use.  Alpha gal level in 2022 had gone down significantly from previous so she has a challenge planned 2/14   Past Medical History: Past Medical History:  Diagnosis Date   Angioedema July 2014   GERD (gastroesophageal reflux disease)    Hypercholesterolemia    Hypertension    Migraine    Peripheral artery disease (HCC)     Objective:  BP 102/62   Pulse 94   Temp 97.9 F (36.6 C) (Temporal)   Resp 14   Ht 5\' 3"  (1.6 m)   Wt 163 lb 6.4 oz (74.1 kg)   SpO2 97%   BMI 28.95 kg/m  Body mass index is 28.95 kg/m. Physical Exam: GEN: alert, well developed HEENT: clear conjunctiva, TM grey and translucent, nose with moderate inferior turbinate hypertrophy, pink nasal mucosa, no rhinorrhea, no cobblestoning HEART: regular rate and rhythm, + systolic murmur  LUNGS: clear to auscultation bilaterally, no coughing, unlabored respiration SKIN: no rashes or lesions  Spirometry:  Tracings reviewed. Her effort: It was hard to get consistent efforts and there is a question as to whether this reflects a maximal maneuver. FVC: 1.88L FEV1: 1.23L, 61% predicted FEV1/FVC ratio: 65% Interpretation: Spirometry consistent with mild obstructive disease.  Please see scanned spirometry results for details.   Assessment/Plan   Angioedema with alpha gal syndrome - Continue to avoid red meats for now.  Alpha gal level came down significantly on the past lab check in 2022.  There is a challenge set up for hamburger/steak on 09/17/2022 at 8:30 AM.  - Keep Epipen on you at all times.    Shortness of breath/Cough - This is likely COPD, not asthma.  She does not have an atopic history and has smoked since age 74.  Spirometry today show possible obstruction but not the best effort.  - Use Albuterol 2 puffs as needed every 6 hours for wheezing/coughing/shortness of breath.  - If symptoms worsen, recommend referral to Pulmonology.     No follow-ups on file. Harlon Flor, MD  [facility] [place] [facility] [place]

## 2022-09-17 ENCOUNTER — Encounter: Payer: Self-pay | Admitting: Allergy & Immunology

## 2022-09-17 ENCOUNTER — Ambulatory Visit (INDEPENDENT_AMBULATORY_CARE_PROVIDER_SITE_OTHER): Payer: Medicare HMO | Admitting: Allergy & Immunology

## 2022-09-17 VITALS — BP 174/96 | HR 70 | Temp 98.6°F | Resp 18 | Wt 164.0 lb

## 2022-09-17 DIAGNOSIS — T7800XA Anaphylactic reaction due to unspecified food, initial encounter: Secondary | ICD-10-CM | POA: Diagnosis not present

## 2022-09-17 DIAGNOSIS — R221 Localized swelling, mass and lump, neck: Secondary | ICD-10-CM

## 2022-09-17 DIAGNOSIS — T7800XD Anaphylactic reaction due to unspecified food, subsequent encounter: Secondary | ICD-10-CM

## 2022-09-17 DIAGNOSIS — R220 Localized swelling, mass and lump, head: Secondary | ICD-10-CM

## 2022-09-17 NOTE — Progress Notes (Signed)
FOLLOW UP  Date of Service/Encounter:  09/17/22   Assessment:   Angioedema - improved with avoidance of mammalian meat    Current smoker - with some SOB c/w COPD   Fully vaccinated to [place]  Plan/Recommendations:   1. Angioedema with alpha gal syndrome - You tolerated your steak challenge today, but please call us in one hour to let us know how you are doing!  - EpiPen is up to date.   2. Shortness of breath - likely COPD - Continue with albuterol as needed.  3. Return in about 6 months (around 03/18/2023).   Subjective:   Courtney Gill is a 77 y.o. female presenting today for follow up of  Chief Complaint  Patient presents with   Food/Drug Challenge    Alpha-Gal Tyrone has a history of the following: Patient Active Problem List   Diagnosis Date Noted   Sleep behavior disorder, REM 06/02/2018   Loud snoring 06/02/2018   PAD (peripheral artery disease) ([place]) 06/02/2018   Psychophysiological insomnia 06/02/2018   Angioedema 02/28/2013   HTN (hypertension) 02/28/2013   Hyperlipidemia 02/28/2013    History obtained from: chart review and patient.  [name] is a 77 y.o. female presenting for a food challenge.  She was last seen in January 2024.  At that time, Dr. Posey Pronto diagnosed her with COPD.  She started albuterol 2 puffs every 4-6 hours as needed.  She continues to avoid alpha gal mammalian meats.  She was interested in doing a challenge.  Her last alpha-gal numbers were in October 2022.  They were down to 0.38.  Last visit, she has done very well.  She brought in a state that is cut up into low cubes today for the challenge.  It is not season.  She is open to using ketchup or something else to season it here in the clinic.  Her last action was when she went out to a late lunch with a friend and then 4 hours later developed anaphylaxis that go to the emergency room.  Otherwise, there have been no changes to her past medical history, surgical  history, family history, or social history.    Review of Systems  Constitutional: Negative.  Negative for chills, fever, malaise/fatigue and weight loss.  HENT:  Negative for congestion, ear discharge, ear pain and sinus pain.   Eyes:  Negative for pain, discharge and redness.  Respiratory:  Negative for cough, sputum production, shortness of breath and wheezing.   Cardiovascular: Negative.  Negative for chest pain and palpitations.  Gastrointestinal:  Negative for abdominal pain, constipation, diarrhea, heartburn, nausea and vomiting.  Skin: Negative.  Negative for itching and rash.  Neurological:  Negative for dizziness and headaches.  Endo/Heme/Allergies:  Negative for environmental allergies. Does not bruise/bleed easily.       Objective:   Blood pressure (!) 174/96, pulse 70, temperature 98.6 F (37 C), resp. rate 18, weight 164 lb (74.4 kg), SpO2 98 %. Body mass index is 29.05 kg/m.   Physical exam deferred since this was a challenge appointment only.    Diagnostic studies:   Allergy Studies:     Oral Challenge - 09/17/22 0900     Challenge Food/Drug Beef    Food/Drug provided by Patient    BP 170/92    Pulse 80    Respirations 18    Lungs 95%    Skin clear    Mouth clear    Time 0900  Dose 15g    Time 0930    Dose 45g    Time 1238    Dose FINAL VITALS    BP 174/96    Pulse 70    Respirations 18    Lungs 98    Skin clear    Mouth clear             Allergy testing results were read and interpreted by myself, documented by clinical staff.      Salvatore Marvel, MD  [facility]ergy and [facility]

## 2022-09-17 NOTE — Patient Instructions (Addendum)
1. Angioedema with alpha gal syndrome - You tolerated your steak challenge today, but please call us in one hour to let us know how you are doing!  - EpiPen is up to date.   2. Shortness of breath - likely COPD - Continue with albuterol as needed.  3. Return in about 6 months (around 03/18/2023).    Please inform us of any Emergency Department visits, hospitalizations, or changes in symptoms. Call us before going to the ED for breathing or allergy symptoms since we might be able to fit you in for a sick visit. Feel free to contact us anytime with any questions, problems, or concerns.  It was a pleasure to see you again today!  Websites that have reliable patient information: 1. American Academy of Asthma, Allergy, and Immunology: www.aaaai.org 2. Food Allergy Research and Education (FARE): foodallergy.org 3. Mothers of Asthmatics: http://www.asthmacommunitynetwork.org 4. American College of Allergy, Asthma, and Immunology: www.acaai.org   COVID-19 Vaccine Information can be found at: [email or web address].[email or web address].uk For questions related to vaccine distribution or appointments, please email [email or web address] or call [phone].   We realize that you might be concerned about having an allergic reaction to the COVID19 vaccines. To help with that concern, WE ARE OFFERING THE COVID19 VACCINES IN OUR OFFICE! Ask the front desk for dates!     "Like" [place] on Facebook and Instagram for our latest updates!      A healthy democracy works best when [employer] participate! Make sure you are registered to vote! If you have moved or changed any of your contact information, you will need to get this updated before voting!  In some cases, you MAY be able to register to vote online: [email or web address]

## 2022-09-18 ENCOUNTER — Telehealth: Payer: Self-pay

## 2022-09-18 NOTE — Telephone Encounter (Signed)
Patient called in stating that she called an hour after testing to let the doctor know that she was doing fine an hour later but could not get through to anyone (multiple times). She states that she is still fine and has not had any issues at all and did not end up in the hospital. Patient was very glad and said that she would see [place] in August when she has her next appointment.

## 2022-09-18 NOTE — Telephone Encounter (Signed)
Great thank you for the update!

## 2022-12-01 DIAGNOSIS — N183 Chronic kidney disease, stage 3 unspecified: Secondary | ICD-10-CM | POA: Diagnosis not present

## 2022-12-04 ENCOUNTER — Other Ambulatory Visit (HOSPITAL_COMMUNITY): Payer: Self-pay | Admitting: Internal Medicine

## 2022-12-04 DIAGNOSIS — F17200 Nicotine dependence, unspecified, uncomplicated: Secondary | ICD-10-CM | POA: Diagnosis not present

## 2022-12-04 DIAGNOSIS — Z7901 Long term (current) use of anticoagulants: Secondary | ICD-10-CM | POA: Diagnosis not present

## 2022-12-04 DIAGNOSIS — R809 Proteinuria, unspecified: Secondary | ICD-10-CM | POA: Diagnosis not present

## 2022-12-04 DIAGNOSIS — T7800XD Anaphylactic reaction due to unspecified food, subsequent encounter: Secondary | ICD-10-CM | POA: Diagnosis not present

## 2022-12-04 DIAGNOSIS — R5383 Other fatigue: Secondary | ICD-10-CM | POA: Diagnosis not present

## 2022-12-04 DIAGNOSIS — I739 Peripheral vascular disease, unspecified: Secondary | ICD-10-CM | POA: Diagnosis not present

## 2022-12-04 DIAGNOSIS — Z0001 Encounter for general adult medical examination with abnormal findings: Secondary | ICD-10-CM | POA: Diagnosis not present

## 2022-12-04 DIAGNOSIS — R011 Cardiac murmur, unspecified: Secondary | ICD-10-CM | POA: Diagnosis not present

## 2022-12-04 DIAGNOSIS — E860 Dehydration: Secondary | ICD-10-CM | POA: Diagnosis not present

## 2022-12-04 DIAGNOSIS — N183 Chronic kidney disease, stage 3 unspecified: Secondary | ICD-10-CM | POA: Diagnosis not present

## 2022-12-04 DIAGNOSIS — E782 Mixed hyperlipidemia: Secondary | ICD-10-CM | POA: Diagnosis not present

## 2022-12-04 DIAGNOSIS — I714 Abdominal aortic aneurysm, without rupture, unspecified: Secondary | ICD-10-CM | POA: Diagnosis not present

## 2022-12-16 ENCOUNTER — Ambulatory Visit (HOSPITAL_COMMUNITY)
Admission: RE | Admit: 2022-12-16 | Discharge: 2022-12-16 | Disposition: A | Discharge status: Home / Self Care | Payer: Medicare HMO | Source: Ambulatory Visit | Attending: Internal Medicine | Admitting: Internal Medicine

## 2022-12-16 DIAGNOSIS — Z122 Encounter for screening for malignant neoplasm of respiratory organs: Secondary | ICD-10-CM | POA: Diagnosis not present

## 2022-12-16 DIAGNOSIS — F17200 Nicotine dependence, unspecified, uncomplicated: Secondary | ICD-10-CM

## 2022-12-16 DIAGNOSIS — I700 Atherosclerosis of aorta: Secondary | ICD-10-CM | POA: Diagnosis not present

## 2022-12-16 DIAGNOSIS — F17210 Nicotine dependence, cigarettes, uncomplicated: Secondary | ICD-10-CM | POA: Diagnosis not present

## 2022-12-16 DIAGNOSIS — J439 Emphysema, unspecified: Secondary | ICD-10-CM | POA: Insufficient documentation

## 2022-12-16 DIAGNOSIS — R911 Solitary pulmonary nodule: Secondary | ICD-10-CM | POA: Diagnosis not present

## 2022-12-31 DIAGNOSIS — R809 Proteinuria, unspecified: Secondary | ICD-10-CM | POA: Diagnosis not present

## 2022-12-31 DIAGNOSIS — N183 Chronic kidney disease, stage 3 unspecified: Secondary | ICD-10-CM | POA: Diagnosis not present

## 2023-01-19 DIAGNOSIS — H43822 Vitreomacular adhesion, left eye: Secondary | ICD-10-CM | POA: Diagnosis not present

## 2023-01-19 DIAGNOSIS — H25811 Combined forms of age-related cataract, right eye: Secondary | ICD-10-CM | POA: Diagnosis not present

## 2023-02-10 ENCOUNTER — Institutional Professional Consult (permissible substitution): Payer: Medicare HMO | Admitting: Thoracic Surgery (Cardiothoracic Vascular Surgery)

## 2023-02-10 ENCOUNTER — Other Ambulatory Visit: Payer: Self-pay | Admitting: Thoracic Surgery (Cardiothoracic Vascular Surgery)

## 2023-02-10 ENCOUNTER — Encounter: Payer: Self-pay | Admitting: Thoracic Surgery (Cardiothoracic Vascular Surgery)

## 2023-02-10 VITALS — BP 131/81 | HR 77 | Resp 18 | Ht 63.0 in | Wt 155.0 lb

## 2023-02-10 DIAGNOSIS — I350 Nonrheumatic aortic (valve) stenosis: Secondary | ICD-10-CM

## 2023-02-10 DIAGNOSIS — R911 Solitary pulmonary nodule: Secondary | ICD-10-CM

## 2023-02-10 NOTE — Progress Notes (Signed)
PCP is Benita Stabile, MD Referring Provider is Benita Stabile, MD  Chief Complaint  Patient presents with   Lung Lesion    Chest CT 12/16/22/ Spirometry 08/18/22    HPI: Courtney Gill is sent for consultation regarding a left upper lobe lung nodule.  Courtney Gill is a 77 year old woman with a history of tobacco abuse, COPD, reflux, hypertension, hyperlipidemia, PAD, carotid stent, mild to moderate aortic stenosis, stage III chronic kidney disease,  migraines, alpha gal syndrome and angioedema.  She was found to have a left upper lobe lung nodule on a low-dose CT for lung cancer screening back in May.  There was no evidence of hilar or mediastinal adenopathy  She has about a 50-pack-year history of smoking and continues to smoke a pack a day.  She says she has tried to quit a few times without success.  No chest pain, pressure, or tightness.  Does get short of breath with activities.  Can walk up a flight of stairs without stopping.  Sometimes gets short of breath when she bends over to tie her shoes.  No change in appetite or weight loss.  No unusual headaches or visual changes.  Zubrod Score: At the time of surgery this patient's most appropriate activity status/level should be described as: []     0    Normal activity, no symptoms [x]     1    Restricted in physical strenuous activity but ambulatory, able to do out light work []     2    Ambulatory and capable of self care, unable to do work activities, up and about >50 % of waking hours                              []     3    Only limited self care, in bed greater than 50% of waking hours []     4    Completely disabled, no self care, confined to bed or chair []     5    Moribund  Past Medical History:  Diagnosis Date   Angioedema July 2014   GERD (gastroesophageal reflux disease)    Hypercholesterolemia    Hypertension    Migraine    Peripheral artery disease (HCC)     Past Surgical History:  Procedure Laterality Date   CAROTID STENT Right  2007   EYE SURGERY     TONSILLECTOMY      Family History  Problem Relation Age of Onset   Atrial fibrillation Mother    Diabetes Maternal Grandfather    Cancer Paternal Grandfather     Social History Social History   Tobacco Use   Smoking status: Every Day    Packs/day: 1    Types: Cigarettes   Smokeless tobacco: Never  Substance Use Topics   Alcohol use: No   Drug use: No    Current Outpatient Medications  Medication Sig Dispense Refill   albuterol (PROVENTIL) (5 MG/ML) 0.5% nebulizer solution Take 0.5 mLs (2.5 mg total) by nebulization every 6 (six) hours as needed for wheezing or shortness of breath. 20 mL 12   albuterol (VENTOLIN HFA) 108 (90 Base) MCG/ACT inhaler Inhale 2 puffs into the lungs every 4 (four) hours as needed.     b complex vitamins capsule Take 1 capsule by mouth daily.     cetirizine (ZYRTEC) 10 MG tablet Take 10 mg by mouth daily.     Cholecalciferol (VITAMIN D PO)  Take 5,000 Units by mouth daily.     clopidogrel (PLAVIX) 75 MG tablet Take 1 tablet (75 mg total) by mouth daily with breakfast. 30 tablet 3   diphenhydrAMINE (BENADRYL) 50 MG capsule Take 1 capsule (50 mg total) by mouth every 6 (six) hours as needed for itching or allergies. 30 capsule 0   EPINEPHrine 0.3 mg/0.3 mL IJ SOAJ injection Inject 0.3 mg into the muscle as needed. 2 each 1   Omega-3 Fatty Acids (OMEGA-3 FISH OIL PO) Take by mouth.     pantoprazole (PROTONIX) 40 MG tablet Take 40 mg by mouth daily.     rosuvastatin (CRESTOR) 10 MG tablet Take 40 mg by mouth daily.     vitamin E 400 UNIT capsule Take 400 Units by mouth daily.     No current facility-administered medications for this visit.    Allergies  Allergen Reactions   Aspirin Swelling    Review of Systems  Constitutional:  Negative for activity change, appetite change and unexpected weight change.  HENT:  Positive for dental problem (Dentures). Negative for trouble swallowing and voice change.   Eyes:  Negative for  visual disturbance.  Respiratory:  Positive for cough, shortness of breath (Heavy exertion) and wheezing (Uses albuterol).   Cardiovascular:  Negative for chest pain and leg swelling.  Gastrointestinal:  Positive for abdominal pain (Reflux) and diarrhea.  Genitourinary:  Negative for difficulty urinating and dysuria.  Musculoskeletal:  Positive for arthralgias and joint swelling.  Skin:        Itching  Neurological:  Positive for headaches (Migraines). Negative for weakness.  Hematological:  Negative for adenopathy. Bruises/bleeds easily.  All other systems reviewed and are negative.   BP 131/81 (BP Location: Left Arm, Patient Position: Sitting)   Pulse 77   Resp 18   Ht 5\' 3"  (1.6 m)   Wt 155 lb (70.3 kg)   SpO2 96% Comment: RA  BMI 27.46 kg/m  Physical Exam Vitals reviewed.  Constitutional:      General: She is not in acute distress.    Appearance: Normal appearance.  HENT:     Head: Normocephalic and atraumatic.  Eyes:     General: No scleral icterus.    Extraocular Movements: Extraocular movements intact.  Cardiovascular:     Rate and Rhythm: Normal rate and regular rhythm.     Heart sounds: Murmur (2/6 to 3/6 systolic murmur right upper sternal border) heard.  Pulmonary:     Effort: Pulmonary effort is normal. No respiratory distress.     Breath sounds: Normal breath sounds. No wheezing or rales.  Abdominal:     General: There is no distension.     Palpations: Abdomen is soft.     Tenderness: There is no abdominal tenderness.  Musculoskeletal:     Right lower leg: No edema.     Left lower leg: No edema.  Skin:    General: Skin is warm and dry.  Neurological:     General: No focal deficit present.     Mental Status: She is alert and oriented to person, place, and time.     Cranial Nerves: No cranial nerve deficit.     Motor: No weakness.    Diagnostic Tests: CT CHEST WITHOUT CONTRAST LOW-DOSE FOR LUNG CANCER SCREENING   TECHNIQUE: Multidetector CT imaging  of the chest was performed following the standard protocol without IV contrast.   RADIATION DOSE REDUCTION: This exam was performed according to the departmental dose-optimization program which includes automated exposure control,  adjustment of the mA and/or kV according to patient size and/or use of iterative reconstruction technique.   COMPARISON:  Low-dose lung screen CT chest dated 12/12/2016   FINDINGS: Cardiovascular: The heart is normal in size. No pericardial effusion.   No evidence thoracic aneurysm. Atherosclerotic calcifications of the aortic arch   Moderate 3 vessel coronary atherosclerosis.   Mediastinum/Nodes: Small mediastinal lymph nodes, including a dominant 9 mm short axis low right paratracheal node (series 2/image 23), within the upper limits of normal.   Visualized thyroid is unremarkable.   Lungs/Pleura: Moderate centrilobular emphysematous changes, upper lung predominant.   Bronchiectasis with mild bronchial wall thickening, lower lobe predominant.   No focal consolidation.   New 19.9 mm (volumetric mean) irregular nodule in the anterior left upper lobe (image 99), suspicious.   Additional scattered small calcified and noncalcified pulmonary nodules measuring up to 3.6 mm in the left lower lobe, unchanged, benign.   No pleural effusion or pneumothorax.   Upper Abdomen: Visualized upper abdomen is grossly unremarkable, noting vascular calcifications.   Musculoskeletal: Mild degenerative changes of the visualized thoracolumbar spine.   IMPRESSION: Lung-RADS 4B, suspicious. Additional imaging evaluation or consultation with Pulmonology or Thoracic Surgery recommended.   New 19.9 mm irregular nodule in the anterior left upper lobe, suspicious for primary bronchogenic carcinoma. PET-CT is suggested.   Aortic Atherosclerosis (ICD10-I70.0) and Emphysema (ICD10-J43.9).   These results will be called to the ordering clinician or representative  by the Radiologist Assistant, and communication documented in the PACS or [employer].     Electronically Signed   By: Charline Bills M.D.   On: 12/20/2022 23:37 I personally reviewed the CT images.  20 mm spiculated nodule in anterior left upper lobe.  No mediastinal or hilar adenopathy.  Pulmonary function testing 06/05/2022 FVC 2.23 (78%) FEV1 1.50 (69%) FEV1 1.80 (83%) postbronchodilator RV 4.75 (209%) DLCO 17.28 (90%)  2D echocardiogram in December 2022 showed mild to moderate aortic stenosis with a valve area of 1.2 cm, preserved left-ventricular function.  Impression:  Courtney Gill is a 77 year old woman with a history of tobacco abuse, COPD, reflux, hypertension, hyperlipidemia, PAD, carotid stent, mild to moderate aortic stenosis, stage III chronic kidney disease,  migraines, alpha gal syndrome and angioedema.  Found to have a lung nodule on a low-dose CT for lung cancer screening.  Left upper lobe lung nodule-2 cm spiculated nodule highly suspicious for new primary bronchogenic carcinoma.  Infectious and inflammatory nodules are in the differential diagnosis but are far less likely.  Has to be considered lung cancer and less proven otherwise.  First step in evaluation is PET/CT to guide our initial diagnostic workup.  CT findings are consistent with stage I disease.  However, that could change based on PET.  Tobacco abuse-continues to smoke about a pack a day.  Emphasized the importance of tobacco cessation.  Heart murmur-mild to moderate aortic stenosis with a valve area of 1.2 cm on echocardiogram about 18 months ago.  Will repeat the echo to make sure that has not progressed significantly.  Pulmonary function testing less than a year ago does show adequate pulmonary reserve to tolerate a resection.  Plan: Repeat 2D echo to follow-up mild to moderate aortic stenosis PET/CT to guide initial diagnostic workup of left upper lobe lung nodule Return in 2 to 3 weeks to  discuss results and next steps.  I spent over 45 minutes in review of records, images, and in consultation with Ms. Fleece today. Loreli Slot, MD [facility] [phone]

## 2023-02-19 ENCOUNTER — Encounter (HOSPITAL_COMMUNITY)
Admission: RE | Admit: 2023-02-19 | Discharge: 2023-02-19 | Disposition: A | Discharge status: Home / Self Care | Payer: Medicare HMO | Source: Ambulatory Visit | Attending: Thoracic Surgery (Cardiothoracic Vascular Surgery) | Admitting: Thoracic Surgery (Cardiothoracic Vascular Surgery)

## 2023-02-19 DIAGNOSIS — R911 Solitary pulmonary nodule: Secondary | ICD-10-CM | POA: Diagnosis not present

## 2023-02-19 DIAGNOSIS — I3481 Nonrheumatic mitral (valve) annulus calcification: Secondary | ICD-10-CM | POA: Diagnosis not present

## 2023-02-19 DIAGNOSIS — R590 Localized enlarged lymph nodes: Secondary | ICD-10-CM | POA: Diagnosis not present

## 2023-02-19 DIAGNOSIS — I2510 Atherosclerotic heart disease of native coronary artery without angina pectoris: Secondary | ICD-10-CM | POA: Diagnosis not present

## 2023-02-19 DIAGNOSIS — K449 Diaphragmatic hernia without obstruction or gangrene: Secondary | ICD-10-CM | POA: Diagnosis not present

## 2023-02-19 MED ORDER — FLUDEOXYGLUCOSE F - 18 (FDG) INJECTION
8.3400 | Freq: Once | INTRAVENOUS | Status: AC | PRN
  Administered 2023-02-19: 8.34 via INTRAVENOUS

## 2023-02-23 ENCOUNTER — Telehealth: Payer: Self-pay | Admitting: Allergy & Immunology

## 2023-02-23 NOTE — Telephone Encounter (Signed)
Patient came in today stating she was stung by a wasp last week on her arm. The patient had swelling, redness, and warm to the touch from her finger tips to her elbow. Patient states she did not have any shortness of breath, coughing ,or wheezing. Patient was also stung by a wasp in her thigh a few weeks back but had no issues. The patient was asking about testing for venom allergies.

## 2023-02-24 NOTE — Telephone Encounter (Signed)
I called the patient to get scheduled with a NP and she expressed to be that she preferred to keep the original appointment in August.

## 2023-02-24 NOTE — Telephone Encounter (Signed)
Let's schedule with the NP to discuss further.   Malachi Bonds, MD [facility]

## 2023-02-25 NOTE — Telephone Encounter (Signed)
Ok sounds good. ? ?Joel Gallagher, MD ?[facility] ? ?

## 2023-03-18 ENCOUNTER — Encounter: Payer: Self-pay | Admitting: Allergy & Immunology

## 2023-03-18 ENCOUNTER — Ambulatory Visit: Payer: Medicare HMO | Admitting: Allergy & Immunology

## 2023-03-18 VITALS — BP 140/90 | HR 73 | Temp 97.7°F | Resp 16

## 2023-03-18 DIAGNOSIS — T63481D Toxic effect of venom of other arthropod, accidental (unintentional), subsequent encounter: Secondary | ICD-10-CM

## 2023-03-18 NOTE — Patient Instructions (Addendum)
1. Insect sting allergy, current reaction - We are getting a stinging insect panel. - We will call you in 1-2 weeks with the results of the testing. - We can talk treatment options once the labs are back. - We do venom allergy shots to prevent serious reactions.  - EpiPen is up to date.  2. Return in about 6 months (around 09/18/2023). You can have the follow up appointment with Dr. Dellis Anes or a Nurse Practicioner (our Nurse Practitioners are excellent and always have Physician oversight!).    Please inform us of any Emergency Department visits, hospitalizations, or changes in symptoms. Call us before going to the ED for breathing or allergy symptoms since we might be able to fit you in for a sick visit. Feel free to contact us anytime with any questions, problems, or concerns.  It was a pleasure to see you again today!  Websites that have reliable patient information: 1. American Academy of Asthma, Allergy, and Immunology: www.aaaai.org 2. Food Allergy Research and Education (FARE): foodallergy.org 3. Mothers of Asthmatics: http://www.asthmacommunitynetwork.org 4. American College of Allergy, Asthma, and Immunology: www.acaai.org   COVID-19 Vaccine Information can be found at: PodExchange.nl For questions related to vaccine distribution or appointments, please email [email or web address] or call [phone].   We realize that you might be concerned about having an allergic reaction to the COVID19 vaccines. To help with that concern, WE ARE OFFERING THE COVID19 VACCINES IN OUR OFFICE! Ask the front desk for dates!     "Like" [place] on Facebook and Instagram for our latest updates!      A healthy democracy works best when [employer] participate! Make sure you are registered to vote! If you have moved or changed any of your contact information, you will need to get this updated before voting! Scan the QR codes below to learn  more!

## 2023-03-18 NOTE — Progress Notes (Signed)
FOLLOW UP  Date of Service/Encounter:  03/18/23   Assessment:   Angioedema - resolved (now tolerates mammalian meat without a problem)   Current smoker - with some SOB c/w COPD   Fully vaccinated to COVID19  New reaction to stinging insects - including angioedema of across joints    Plan/Recommendations:   1. Insect sting allergy, current reaction - We are getting a stinging insect panel. - We will call you in 1-2 weeks with the results of the testing. - We can talk treatment options once the labs are back. - We do venom allergy shots to prevent serious reactions.  - EpiPen is up to date.  2. Return in about 6 months (around 09/18/2023). You can have the follow up appointment with Dr. Dellis Anes or a Nurse Practicioner (our Nurse Practitioners are excellent and always have Physician oversight!).     Subjective:   Courtney Gill is a 77 y.o. 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  Courtney Gill is a 77 y.o. female presenting for a follow up visit.  She was last seen in February 2024 when she tolerated her steak challenge.  She was on albuterol as needed for her COPD.  Since last visit, she has done well. She has been eating red meat. She has been eating [place] hamburgers.  She was stung by a wasp one month ago. She had swelling from above her left elbow and it extended down to her fingers. She treated it with ice packs. It was very pruritic. She tried some calamine lotion  and it helped with the itching. She has been stung by bees off and on without a problem.   She is otherwise doing well.  She has not had any episodes of angioedema.  Her EpiPen is up-to-date, through April 2025.  She is going back for an PET/CT scan tomorrow. She has a history of acid reflux.  She had a left upper lobe lung nodule discovered in May 2024 following a low-dose CT scan for lung cancer screening.  She sees Dr. Edwina Barth for this.  Otherwise, there have been no changes to her past medical history, surgical history, family history, or social history.    Review of systems otherwise negative other than that mentioned in the HPI.    Objective:   Blood pressure (!) 140/90, pulse 73, temperature 97.7 F (36.5 C), resp. rate 16, SpO2 95%. There is no height or weight on file to calculate BMI.    Physical Exam Vitals reviewed.  Constitutional:      Appearance: She is well-developed.     Comments: Talkative as ever.  HENT:     Head: Normocephalic and atraumatic.     Right Ear: Tympanic membrane, ear canal and external ear normal.     Left Ear: Tympanic membrane, ear canal and external ear normal.     Nose: No nasal deformity, septal deviation, mucosal edema or rhinorrhea.     Right Turbinates: Swollen  and pale.     Left Turbinates: Swollen and pale.     Right Sinus: No maxillary sinus tenderness or frontal sinus tenderness.     Left Sinus: No maxillary sinus tenderness or frontal sinus tenderness.     Mouth/Throat:     Lips: Pink.     Mouth: Mucous membranes are moist. Mucous membranes are not pale and not dry.     Pharynx: Uvula midline.  Eyes:     General: Lids are normal. No allergic shiner.       Right eye: No discharge.        Left eye: No discharge.     Conjunctiva/sclera: Conjunctivae normal.     Right eye: Right conjunctiva is not injected. No chemosis.    Left eye: Left conjunctiva is not injected. No chemosis.    Pupils: Pupils are equal, round,  and reactive to light.  Cardiovascular:     Rate and Rhythm: Normal rate and regular rhythm.     Heart sounds: Normal heart sounds.  Pulmonary:     Effort: Pulmonary effort is normal. No tachypnea, accessory muscle usage or respiratory distress.     Breath sounds: Normal breath sounds. No wheezing, rhonchi or rales.  Chest:     Chest wall: No tenderness.  Lymphadenopathy:     Cervical: No cervical adenopathy.  Skin:    Coloration: Skin is not pale.     Findings: No abrasion, erythema, petechiae or rash. Rash is not papular, urticarial or vesicular.  Neurological:     Mental Status: She is alert.  Psychiatric:        Behavior: Behavior is cooperative.      Diagnostic studies: labs sent instead      Malachi Bonds, MD  [facility]

## 2023-03-19 ENCOUNTER — Ambulatory Visit (HOSPITAL_COMMUNITY)
Admission: RE | Admit: 2023-03-19 | Discharge: 2023-03-19 | Disposition: A | Discharge status: Home / Self Care | Payer: Medicare HMO | Source: Ambulatory Visit | Attending: Thoracic Surgery (Cardiothoracic Vascular Surgery) | Admitting: Thoracic Surgery (Cardiothoracic Vascular Surgery)

## 2023-03-19 DIAGNOSIS — I350 Nonrheumatic aortic (valve) stenosis: Secondary | ICD-10-CM | POA: Insufficient documentation

## 2023-03-19 DIAGNOSIS — T63481D Toxic effect of venom of other arthropod, accidental (unintentional), subsequent encounter: Secondary | ICD-10-CM | POA: Diagnosis not present

## 2023-03-19 LAB — ECHOCARDIOGRAM COMPLETE
AR max vel: 1.13 cm2
AV Area VTI: 1.24 cm2
AV Area mean vel: 1.06 cm2
AV Mean grad: 21.5 mmHg
AV Peak grad: 35.5 mmHg
Ao pk vel: 2.98 m/s
Area-P 1/2: 2.54 cm2
S' Lateral: 2.5 cm

## 2023-03-19 NOTE — Progress Notes (Signed)
*  PRELIMINARY RESULTS* Echocardiogram 2D Echocardiogram has been performed.  Stacey Drain 03/19/2023, 1:58 PM

## 2023-03-24 ENCOUNTER — Encounter: Payer: Self-pay | Admitting: Thoracic Surgery (Cardiothoracic Vascular Surgery)

## 2023-03-24 ENCOUNTER — Ambulatory Visit: Payer: Medicare HMO | Admitting: Thoracic Surgery (Cardiothoracic Vascular Surgery)

## 2023-03-24 VITALS — BP 157/82 | HR 73 | Resp 20 | Ht 63.0 in | Wt 155.0 lb

## 2023-03-24 DIAGNOSIS — R911 Solitary pulmonary nodule: Secondary | ICD-10-CM | POA: Diagnosis not present

## 2023-03-24 NOTE — Progress Notes (Signed)
[address]       [place] [address]             [phone]     HPI: Courtney Gill returns to discuss results of her PET/CT and management of her left upper lobe lung nodule.  Courtney Gill is a 77 year old woman with a history of tobacco abuse, COPD, reflux, hypertension, hyperlipidemia, PAD, carotid stent, moderate aortic stenosis, stage III chronic kidney disease, migraines, alpha gal, angioedema, and a left upper lobe lung nodule.  She was found to have a left upper lobe lung nodule on a low-dose CT for lung cancer screening in May.  There was some borderline mediastinal adenopathy.  I saw her in early July and recommended a PET/CT and also an echocardiogram to evaluate her aortic stenosis.  Continues to smoke.  Says she has "cut back."  Short of breath with heavy activity but able to participate in normal activities and walk up a flight of stairs.  No change in appetite or weight loss.  No unusual headaches or visual changes.  Zubrod Score: At the time of surgery this patient's most appropriate activity status/level should be described as: []     0    Normal activity, no symptoms [x]     1    Restricted in physical strenuous activity but ambulatory, able to do out light work []     2    Ambulatory and capable of self care, unable to do work activities, up and about >50 % of waking hours                              []     3    Only limited self care, in bed greater than 50% of waking hours []     4    Completely disabled, no self care, confined to bed or chair []     5    Moribund  Past Medical History:  Diagnosis Date   Angioedema July 2014   GERD (gastroesophageal reflux disease)    Hypercholesterolemia    Hypertension    Migraine    Peripheral artery disease (HCC)    Past Surgical History:  Procedure Laterality Date   CAROTID STENT Right 2007   EYE SURGERY     TONSILLECTOMY      Current Outpatient Medications  Medication Sig Dispense Refill   albuterol  (PROVENTIL) (5 MG/ML) 0.5% nebulizer solution Take 0.5 mLs (2.5 mg total) by nebulization every 6 (six) hours as needed for wheezing or shortness of breath. 20 mL 12   albuterol (VENTOLIN HFA) 108 (90 Base) MCG/ACT inhaler Inhale 2 puffs into the lungs every 4 (four) hours as needed.     b complex vitamins capsule Take 1 capsule by mouth daily.     cetirizine (ZYRTEC) 10 MG tablet Take 10 mg by mouth daily.     Cholecalciferol (VITAMIN D PO) Take 5,000 Units by mouth daily.     clopidogrel (PLAVIX) 75 MG tablet Take 1 tablet (75 mg total) by mouth daily with breakfast. 30 tablet 3   diphenhydrAMINE (BENADRYL) 50 MG capsule Take 1 capsule (50 mg total) by mouth every 6 (six) hours as needed for itching or allergies. 30 capsule 0   EPINEPHrine 0.3 mg/0.3 mL IJ SOAJ injection Inject 0.3 mg into the muscle as needed. 2 each 1   Omega-3 Fatty Acids (OMEGA-3 FISH OIL PO) Take by mouth.  pantoprazole (PROTONIX) 40 MG tablet Take 40 mg by mouth daily.     Probiotic Product (PROBIOTIC PO) Take by mouth.     rosuvastatin (CRESTOR) 10 MG tablet Take 40 mg by mouth daily.     vitamin E 400 UNIT capsule Take 400 Units by mouth daily.     No current facility-administered medications for this visit.    Physical Exam BP (!) 157/82 (BP Location: Right Arm, Patient Position: Sitting, Cuff Size: Normal)   Pulse 73   Resp 20   Ht 5\' 3"  (1.6 m)   Wt 155 lb (70.3 kg)   SpO2 95% Comment: RA  BMI 27.32 kg/m  77 year old woman in no acute distress Alert and oriented x 3 with no focal deficits No cervical or supraclavicular adenopathy Lungs clear bilaterally with no rales or wheezing Cardiac regular rate and rhythm with a 2/6 to 3/6 systolic murmur right upper sternal border No clubbing, cyanosis or edema  Diagnostic Tests: NUCLEAR MEDICINE PET SKULL BASE TO THIGH   TECHNIQUE: 8.34 mCi F-18 FDG was injected intravenously. Full-ring PET imaging was performed from the skull base to thigh after the  radiotracer. CT data was obtained and used for attenuation correction and anatomic localization.   Fasting blood glucose: 103 mg/dl   COMPARISON:  Multiple priors including chest CT Dec 16, 2022, CT abdomen pelvis May 20, 2022 and chest CT Dec 12, 2016   FINDINGS: Mediastinal blood pool activity: SUV max 2.1   Liver activity: SUV max NA   NECK: No hypermetabolic cervical adenopathy.   Incidental CT findings: None.   CHEST: Hypermetabolic irregular nodule in the anterior left upper lobe measures 2.1 x 1.6 cm on image 36/7 with a max SUV of 7.69.   Mildly enlarged/prominent mediastinal lymph nodes are stable dating back to chest CT Dec 12, 2016 demonstrate low-level FDG avidity. For reference:   -low right paratracheal lymph node measures 12 mm in short axis on image 82/3 with a max SUV of 3.0.   Incidental CT findings: Small hiatal hernia. Calcifications of the mitral annulus with left atrial enlargement. Coronary artery calcifications. Calcifications of the aortic valve.   ABDOMEN/PELVIS: No abnormal hypermetabolic activity within the liver, pancreas, adrenal glands, or spleen.   No hypermetabolic lymph nodes in the abdomen or pelvis.   Incidental CT findings: Aortic atherosclerosis. 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   SKELETON: No focal hypermetabolic activity to suggest skeletal metastasis.   Incidental CT findings: None.   IMPRESSION: 1. Hypermetabolic irregular nodule in the anterior left upper lobe measures 2.1 x 1.6 cm, highly suspicious for primary bronchogenic carcinoma. 2. Mildly enlarged/prominent mediastinal lymph nodes are stable dating back to chest CT Dec 12, 2016 demonstrate low-level FDG avidity, are nonspecific but favored reactive given chronicity. Attention on follow-up imaging suggested. 3. No  evidence of FDG avid metastatic disease in the neck, abdomen or pelvis. 4.  Aortic Atherosclerosis (ICD10-I70.0).     Electronically Signed   By: Maudry Mayhew M.D.   On: 02/21/2023 10:58  Echocardiogram 03/19/2023 IMPRESSIONS    1. Left ventricular ejection fraction, by estimation, is 65 to 70%. The  left ventricle has normal function. The left ventricle has no regional  wall motion abnormalities. There is moderate left ventricular hypertrophy.  Left ventricular diastolic  parameters are consistent with Grade I diastolic dysfunction (impaired  relaxation). Elevated left atrial pressure.   2.  Right ventricular systolic function is normal. The right ventricular  size is normal. There is normal pulmonary artery systolic pressure.   3. Left atrial size was moderately dilated.   4. The mitral valve is abnormal. Trivial mitral valve regurgitation. No  evidence of mitral stenosis. Moderate mitral annular calcification.   5. The tricuspid valve is abnormal.   6. The aortic valve is tricuspid. There is moderate calcification of the  aortic valve. There is moderate thickening of the aortic valve. Aortic  valve regurgitation is mild. Moderate aortic valve stenosis. Aortic valve  mean gradient measures 21.5 mmHg.  Aortic valve peak gradient measures 35.5 mmHg. Aortic valve area, by VTI  measures 1.24 cm.   7. The inferior vena cava is normal in size with greater than 50%  respiratory variability, suggesting right atrial pressure of 3 mmHg.   I personally reviewed the PET/CT images.  There is a 2 cm left upper lobe mass that is markedly hypermetabolic.  Borderline hypermetabolism in mediastinal nodes.  Pulmonary function testing FVC 2.23 (78%) FEV1 1.50 (69%) FEV1 1.80 (83%) postbronchodilator DLCO 17.28 (90%)  Impression: Courtney Gill is a 77 year old woman with a history of tobacco abuse, COPD, reflux, hypertension, hyperlipidemia, PAD, carotid stent, mild to moderate aortic stenosis,  stage III chronic kidney disease, migraines, alpha gal syndrome and angioedema. Found to have a lung nodule on a low-dose CT for lung cancer screening.   Left upper lobe lung nodule-markedly hypermetabolic on PET/CT.  Given her age, smoking history, and appearance of the nodule, this is almost certainly a new primary bronchogenic carcinoma.  Infectious inflammatory nodules are in the differential but are exceedingly unlikely.  This has to be considered a lung cancer unless it can be proven otherwise.  We discussed options for treatment of the nodule including surgical resection and stereotactic radiation.  I recommended that if we were going to do surgical resection that we would proceed directly to surgery and confirm the diagnosis with a wedge resection prior to a left upper lobectomy.  If she opted for stereotactic radiation then we would attempt navigational bronchoscopy for diagnosis prior to referral.  We discussed the advantages and disadvantages of resection versus radiation.  She wishes to proceed with surgical resection.  She does have adequate pulmonary function to tolerate a lobectomy based on her PFTs.  FEV1 is 70% normal at baseline and and improved to 83% with bronchodilators.  Diffusion capacity is 90% of predicted.  Since she does have some borderline mediastinal adenopathy including a right paratracheal node, I recommended we do endobronchial ultrasound prior to resection.  If the aspirations of the node were positive then resection would not be appropriate.  I think the likelihood that is extremely small.  I informed her of the general nature of the procedure including general anesthesia, the incisions to be used, the use of the surgical robot, use of a drainage tube postoperatively, the expected hospital stay, and the overall recovery.  I informed her of the indications, risks, benefits, and alternatives.  She understands the risks include, but not limited to death, MI, DVT, PE, bleeding,  possible need for transfusion, infection, prolonged air leak, cardiac arrhythmias, as well as possibility of a complications.  Aortic stenosis-moderate by echo with a valve area of 1.2 cm.  Avoidance of hypotension important in the perioperative period.  Tobacco use-again emphasized the importance of tobacco cessation.  Plan: Endobronchial ultrasound, robotic assisted left VATS for left upper lobe wedge resection possible left upper lobectomy Will call to  schedule  I spent over 45 minutes in review of records, images, and in consultation with Ms. Neville today Loreli Slot, MD [facility] [phone]

## 2023-03-24 NOTE — H&P (View-Only) (Signed)
[address]       [name] [address]             [phone]     HPI: Ms. Cappella returns to discuss results of her PET/CT and management of her left upper lobe lung nodule.  Courtney Gill is a 77 year old woman with a history of tobacco abuse, COPD, reflux, hypertension, hyperlipidemia, PAD, carotid stent, moderate aortic stenosis, stage III chronic kidney disease, migraines, alpha gal, angioedema, and a left upper lobe lung nodule.  She was found to have a left upper lobe lung nodule on a low-dose CT for lung cancer screening in May.  There was some borderline mediastinal adenopathy.  I saw her in early July and recommended a PET/CT and also an echocardiogram to evaluate her aortic stenosis.  Continues to smoke.  Says she has "cut back."  Short of breath with heavy activity but able to participate in normal activities and walk up a flight of stairs.  No change in appetite or weight loss.  No unusual headaches or visual changes.  Zubrod Score: At the time of surgery this patient's most appropriate activity status/level should be described as: []     0    Normal activity, no symptoms [x]     1    Restricted in physical strenuous activity but ambulatory, able to do out light work []     2    Ambulatory and capable of self care, unable to do work activities, up and about >50 % of waking hours                              []     3    Only limited self care, in bed greater than 50% of waking hours []     4    Completely disabled, no self care, confined to bed or chair []     5    Moribund  Past Medical History:  Diagnosis Date   Angioedema July 2014   GERD (gastroesophageal reflux disease)    Hypercholesterolemia    Hypertension    Migraine    Peripheral artery disease (HCC)    Past Surgical History:  Procedure Laterality Date   CAROTID STENT Right 2007   EYE SURGERY     TONSILLECTOMY      Current Outpatient Medications  Medication Sig Dispense Refill   albuterol  (PROVENTIL) (5 MG/ML) 0.5% nebulizer solution Take 0.5 mLs (2.5 mg total) by nebulization every 6 (six) hours as needed for wheezing or shortness of breath. 20 mL 12   albuterol (VENTOLIN HFA) 108 (90 Base) MCG/ACT inhaler Inhale 2 puffs into the lungs every 4 (four) hours as needed.     b complex vitamins capsule Take 1 capsule by mouth daily.     cetirizine (ZYRTEC) 10 MG tablet Take 10 mg by mouth daily.     Cholecalciferol (VITAMIN D PO) Take 5,000 Units by mouth daily.     clopidogrel (PLAVIX) 75 MG tablet Take 1 tablet (75 mg total) by mouth daily with breakfast. 30 tablet 3   diphenhydrAMINE (BENADRYL) 50 MG capsule Take 1 capsule (50 mg total) by mouth every 6 (six) hours as needed for itching or allergies. 30 capsule 0   EPINEPHrine 0.3 mg/0.3 mL IJ SOAJ injection Inject 0.3 mg into the muscle as needed. 2 each 1   Omega-3 Fatty Acids (OMEGA-3 FISH OIL PO) Take by mouth.  pantoprazole (PROTONIX) 40 MG tablet Take 40 mg by mouth daily.     Probiotic Product (PROBIOTIC PO) Take by mouth.     rosuvastatin (CRESTOR) 10 MG tablet Take 40 mg by mouth daily.     vitamin E 400 UNIT capsule Take 400 Units by mouth daily.     No current facility-administered medications for this visit.    Physical Exam BP (!) 157/82 (BP Location: Right Arm, Patient Position: Sitting, Cuff Size: Normal)   Pulse 73   Resp 20   Ht 5\' 3"  (1.6 m)   Wt 155 lb (70.3 kg)   SpO2 95% Comment: RA  BMI 27.34 kg/m  77 year old woman in no acute distress Alert and oriented x 3 with no focal deficits No cervical or supraclavicular adenopathy Lungs clear bilaterally with no rales or wheezing Cardiac regular rate and rhythm with a 2/6 to 3/6 systolic murmur right upper sternal border No clubbing, cyanosis or edema  Diagnostic Tests: NUCLEAR MEDICINE PET SKULL BASE TO THIGH   TECHNIQUE: 8.34 mCi F-18 FDG was injected intravenously. Full-ring PET imaging was performed from the skull base to thigh after the  radiotracer. CT data was obtained and used for attenuation correction and anatomic localization.   Fasting blood glucose: 103 mg/dl   COMPARISON:  Multiple priors including chest CT Dec 16, 2022, CT abdomen pelvis May 20, 2022 and chest CT Dec 12, 2016   FINDINGS: Mediastinal blood pool activity: SUV max 2.1   Liver activity: SUV max NA   NECK: No hypermetabolic cervical adenopathy.   Incidental CT findings: None.   CHEST: Hypermetabolic irregular nodule in the anterior left upper lobe measures 2.1 x 1.6 cm on image 36/7 with a max SUV of 7.69.   Mildly enlarged/prominent mediastinal lymph nodes are stable dating back to chest CT Dec 12, 2016 demonstrate low-level FDG avidity. For reference:   -low right paratracheal lymph node measures 12 mm in short axis on image 82/3 with a max SUV of 3.0.   Incidental CT findings: Small hiatal hernia. Calcifications of the mitral annulus with left atrial enlargement. Coronary artery calcifications. Calcifications of the aortic valve.   ABDOMEN/PELVIS: No abnormal hypermetabolic activity within the liver, pancreas, adrenal glands, or spleen.   No hypermetabolic lymph nodes in the abdomen or pelvis.   Incidental CT findings: Aortic atherosclerosis. 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   SKELETON: No focal hypermetabolic activity to suggest skeletal metastasis.   Incidental CT findings: None.   IMPRESSION: 1. Hypermetabolic irregular nodule in the anterior left upper lobe measures 2.1 x 1.6 cm, highly suspicious for primary bronchogenic carcinoma. 2. Mildly enlarged/prominent mediastinal lymph nodes are stable dating back to chest CT Dec 12, 2016 demonstrate low-level FDG avidity, are nonspecific but favored reactive given chronicity. Attention on follow-up imaging suggested. 3. No  evidence of FDG avid metastatic disease in the neck, abdomen or pelvis. 4.  Aortic Atherosclerosis (ICD10-I70.0).     Electronically Signed   By: Maudry Mayhew M.D.   On: 02/21/2023 10:58  Echocardiogram 03/19/2023 IMPRESSIONS    1. Left ventricular ejection fraction, by estimation, is 65 to 70%. The  left ventricle has normal function. The left ventricle has no regional  wall motion abnormalities. There is moderate left ventricular hypertrophy.  Left ventricular diastolic  parameters are consistent with Grade I diastolic dysfunction (impaired  relaxation). Elevated left atrial pressure.   2.  Right ventricular systolic function is normal. The right ventricular  size is normal. There is normal pulmonary artery systolic pressure.   3. Left atrial size was moderately dilated.   4. The mitral valve is abnormal. Trivial mitral valve regurgitation. No  evidence of mitral stenosis. Moderate mitral annular calcification.   5. The tricuspid valve is abnormal.   6. The aortic valve is tricuspid. There is moderate calcification of the  aortic valve. There is moderate thickening of the aortic valve. Aortic  valve regurgitation is mild. Moderate aortic valve stenosis. Aortic valve  mean gradient measures 21.5 mmHg.  Aortic valve peak gradient measures 35.5 mmHg. Aortic valve area, by VTI  measures 1.24 cm.   7. The inferior vena cava is normal in size with greater than 50%  respiratory variability, suggesting right atrial pressure of 3 mmHg.   I personally reviewed the PET/CT images.  There is a 2 cm left upper lobe mass that is markedly hypermetabolic.  Borderline hypermetabolism in mediastinal nodes.  Pulmonary function testing FVC 2.23 (78%) FEV1 1.50 (69%) FEV1 1.80 (83%) postbronchodilator DLCO 17.28 (90%)  Impression: Courtney Gill is a 77 year old woman with a history of tobacco abuse, COPD, reflux, hypertension, hyperlipidemia, PAD, carotid stent, mild to moderate aortic stenosis,  stage III chronic kidney disease, migraines, alpha gal syndrome and angioedema. Found to have a lung nodule on a low-dose CT for lung cancer screening.   Left upper lobe lung nodule-markedly hypermetabolic on PET/CT.  Given her age, smoking history, and appearance of the nodule, this is almost certainly a new primary bronchogenic carcinoma.  Infectious inflammatory nodules are in the differential but are exceedingly unlikely.  This has to be considered a lung cancer unless it can be proven otherwise.  We discussed options for treatment of the nodule including surgical resection and stereotactic radiation.  I recommended that if we were going to do surgical resection that we would proceed directly to surgery and confirm the diagnosis with a wedge resection prior to a left upper lobectomy.  If she opted for stereotactic radiation then we would attempt navigational bronchoscopy for diagnosis prior to referral.  We discussed the advantages and disadvantages of resection versus radiation.  She wishes to proceed with surgical resection.  She does have adequate pulmonary function to tolerate a lobectomy based on her PFTs.  FEV1 is 70% normal at baseline and and improved to 83% with bronchodilators.  Diffusion capacity is 90% of predicted.  Since she does have some borderline mediastinal adenopathy including a right paratracheal node, I recommended we do endobronchial ultrasound prior to resection.  If the aspirations of the node were positive then resection would not be appropriate.  I think the likelihood that is extremely small.  I informed her of the general nature of the procedure including general anesthesia, the incisions to be used, the use of the surgical robot, use of a drainage tube postoperatively, the expected hospital stay, and the overall recovery.  I informed her of the indications, risks, benefits, and alternatives.  She understands the risks include, but not limited to death, MI, DVT, PE, bleeding,  possible need for transfusion, infection, prolonged air leak, cardiac arrhythmias, as well as possibility of a complications.  Aortic stenosis-moderate by echo with a valve area of 1.2 cm.  Avoidance of hypotension important in the perioperative period.  Tobacco use-again emphasized the importance of tobacco cessation.  Plan: Endobronchial ultrasound, robotic assisted left VATS for left upper lobe wedge resection possible left upper lobectomy Will call to  schedule  I spent over 45 minutes in review of records, images, and in consultation with Ms. Hollin today Loreli Slot, MD [facility] [phone]

## 2023-03-25 ENCOUNTER — Other Ambulatory Visit: Payer: Self-pay | Admitting: *Deleted

## 2023-03-25 ENCOUNTER — Encounter: Payer: Self-pay | Admitting: *Deleted

## 2023-03-25 DIAGNOSIS — R911 Solitary pulmonary nodule: Secondary | ICD-10-CM

## 2023-03-26 LAB — HYMENOPTERA VENOM ALLERGY II
Bumblebee: <0.1 kU/L
Hornet, White Face, IgE: 0.53 kU/L — AB
Hornet, Yellow, IgE: <0.1 kU/L
I001-IgE Honeybee: 0.14 kU/L — AB
I003-IgE Yellow Jacket: 0.68 kU/L — AB
I004-IgE Paper Wasp: 1.89 kU/L — AB
I208-IgE Api m 1: <0.1 kU/L
I209-IgE Ves v 5: <0.1 kU/L
I210-IgE Pol d 5: <0.1 kU/L
I211-IgE Ves v 1: 0.8 kU/L — AB
I214-IgE Api m 2: <0.1 kU/L
I215-IgE Api m 3: <0.1 kU/L
I216-IgE Api m 5: 0.99 kU/L — AB
I217-IgE Api m 10: <0.1 kU/L
Tryptase: 5.6 ug/L (ref 2.2–13.2)

## 2023-03-26 LAB — TRYPTASE

## 2023-03-26 LAB — ALLERGEN COMPONENT COMMENTS

## 2023-03-26 LAB — O214-IGE CCD DETERMINANTS: O214-IgE CCD Determinants: <0.1 kU/L

## 2023-04-20 NOTE — Pre-Procedure Instructions (Signed)
Surgical Instructions   Your procedure is scheduled on April 23, 2023. Report to [facility] Main Entrance "A" at 10:20 A.M., then check in with the Admitting office. Any questions or running late day of surgery: call [phone]  Questions prior to your surgery date: call [phone], Monday-Friday, 8am-4pm. If you experience any cold or flu symptoms such as cough, fever, chills, shortness of breath, etc. between now and your scheduled surgery, please notify us at the above number.     Remember:  Do not eat or drink after midnight the night before your surgery    Take these medicines the morning of surgery with A SIP OF WATER: pantoprazole (PROTONIX)    May take these medicines IF NEEDED: albuterol (VENTOLIN HFA) inhaler  EPINEPHrine Pen Polyethyl Glycol-Propyl Glycol (SYSTANE) eye drops   STOP taking your clopidogrel (PLAVIX) five days prior to surgery. Your last dose will be September 13th.   One week prior to surgery, STOP taking any Aspirin (unless otherwise instructed by your surgeon) Aleve, Naproxen, Ibuprofen, Motrin, Advil, Goody's, BC's, all herbal medications, fish oil, and non-prescription vitamins.                     Do NOT Smoke (Tobacco/Vaping) for 24 hours prior to your procedure.  If you use a CPAP at night, you may bring your mask/headgear for your overnight stay.   You will be asked to remove any contacts, glasses, piercing's, hearing aid's, dentures/partials prior to surgery. Please bring cases for these items if needed.    Patients discharged the day of surgery will not be allowed to drive home, and someone needs to stay with them for 24 hours.  SURGICAL WAITING ROOM VISITATION Patients may have no more than 2 support people in the waiting area - these visitors may rotate.   Pre-op nurse will coordinate an appropriate time for 1 ADULT support person, who may not rotate, to accompany patient in pre-op.  Children under the age of 19 must have an  adult with them who is not the patient and must remain in the main waiting area with an adult.  If the patient needs to stay at the hospital during part of their recovery, the visitor guidelines for inpatient rooms apply.  Please refer to the [facility] website for the visitor guidelines for any additional information.   If you received a COVID test during your pre-op visit  it is requested that you wear a mask when out in public, stay away from anyone that may not be feeling well and notify your surgeon if you develop symptoms. If you have been in contact with anyone that has tested positive in the last 10 days please notify you surgeon.      Pre-operative CHG Bathing Instructions   You can play a key role in reducing the risk of infection after surgery. Your skin needs to be as free of germs as possible. You can reduce the number of germs on your skin by washing with CHG (chlorhexidine gluconate) soap before surgery. CHG is an antiseptic soap that kills germs and continues to kill germs even after washing.   DO NOT use if you have an allergy to chlorhexidine/CHG or antibacterial soaps. If your skin becomes reddened or irritated, stop using the CHG and notify one of our RNs at [phone].              TAKE A SHOWER THE NIGHT BEFORE SURGERY AND THE DAY OF SURGERY    Please  keep in mind the following:  DO NOT shave, including legs and underarms, 48 hours prior to surgery.   You may shave your face before/day of surgery.  Place clean sheets on your bed the night before surgery Use a clean washcloth (not used since being washed) for each shower. DO NOT sleep with pet's night before surgery.  CHG Shower Instructions:  If you choose to wash your hair and private area, wash first with your normal shampoo/soap.  After you use shampoo/soap, rinse your hair and body thoroughly to remove shampoo/soap residue.  Turn the water OFF and apply half the bottle of CHG soap to a CLEAN washcloth.   Apply CHG soap ONLY FROM YOUR NECK DOWN TO YOUR TOES (washing for 3-5 minutes)  DO NOT use CHG soap on face, private areas, open wounds, or sores.  Pay special attention to the area where your surgery is being performed.  If you are having back surgery, having someone wash your back for you may be helpful. Wait 2 minutes after CHG soap is applied, then you may rinse off the CHG soap.  Pat dry with a clean towel  Put on clean pajamas    Additional instructions for the day of surgery: DO NOT APPLY any lotions, deodorants, cologne, or perfumes.   Do not wear jewelry or makeup Do not wear nail polish, gel polish, artificial nails, or any other type of covering on natural nails (fingers and toes) Do not bring valuables to the hospital. [facility] is not responsible for valuables/personal belongings. Put on clean/comfortable clothes.  Please brush your teeth.  Ask your nurse before applying any prescription medications to the skin.

## 2023-04-21 ENCOUNTER — Encounter (HOSPITAL_COMMUNITY)
Admission: RE | Admit: 2023-04-21 | Discharge: 2023-04-21 | Disposition: A | Discharge status: Home / Self Care | Payer: Medicare HMO | Source: Ambulatory Visit | Attending: Thoracic Surgery (Cardiothoracic Vascular Surgery) | Admitting: Thoracic Surgery (Cardiothoracic Vascular Surgery)

## 2023-04-21 ENCOUNTER — Ambulatory Visit (HOSPITAL_COMMUNITY)
Admission: RE | Admit: 2023-04-21 | Discharge: 2023-04-21 | Disposition: A | Discharge status: Home / Self Care | Payer: Medicare HMO | Source: Ambulatory Visit | Attending: Thoracic Surgery (Cardiothoracic Vascular Surgery) | Admitting: Thoracic Surgery (Cardiothoracic Vascular Surgery)

## 2023-04-21 ENCOUNTER — Other Ambulatory Visit: Payer: Self-pay

## 2023-04-21 ENCOUNTER — Encounter (HOSPITAL_COMMUNITY): Payer: Self-pay

## 2023-04-21 VITALS — BP 145/77 | HR 66 | Temp 98.5°F | Resp 18 | Ht 63.0 in | Wt 154.6 lb

## 2023-04-21 DIAGNOSIS — E7800 Pure hypercholesterolemia, unspecified: Secondary | ICD-10-CM | POA: Diagnosis not present

## 2023-04-21 DIAGNOSIS — Z01818 Encounter for other preprocedural examination: Secondary | ICD-10-CM | POA: Insufficient documentation

## 2023-04-21 DIAGNOSIS — J449 Chronic obstructive pulmonary disease, unspecified: Secondary | ICD-10-CM | POA: Diagnosis not present

## 2023-04-21 DIAGNOSIS — R9431 Abnormal electrocardiogram [ECG] [EKG]: Secondary | ICD-10-CM | POA: Insufficient documentation

## 2023-04-21 DIAGNOSIS — N1831 Chronic kidney disease, stage 3a: Secondary | ICD-10-CM | POA: Diagnosis not present

## 2023-04-21 DIAGNOSIS — C3412 Malignant neoplasm of upper lobe, left bronchus or lung: Secondary | ICD-10-CM | POA: Diagnosis not present

## 2023-04-21 DIAGNOSIS — I350 Nonrheumatic aortic (valve) stenosis: Secondary | ICD-10-CM | POA: Diagnosis not present

## 2023-04-21 DIAGNOSIS — J939 Pneumothorax, unspecified: Secondary | ICD-10-CM | POA: Diagnosis not present

## 2023-04-21 DIAGNOSIS — R911 Solitary pulmonary nodule: Secondary | ICD-10-CM | POA: Insufficient documentation

## 2023-04-21 DIAGNOSIS — I129 Hypertensive chronic kidney disease with stage 1 through stage 4 chronic kidney disease, or unspecified chronic kidney disease: Secondary | ICD-10-CM | POA: Diagnosis not present

## 2023-04-21 DIAGNOSIS — I4819 Other persistent atrial fibrillation: Secondary | ICD-10-CM | POA: Diagnosis not present

## 2023-04-21 DIAGNOSIS — Z1152 Encounter for screening for COVID-19: Secondary | ICD-10-CM | POA: Insufficient documentation

## 2023-04-21 DIAGNOSIS — I10 Essential (primary) hypertension: Secondary | ICD-10-CM | POA: Insufficient documentation

## 2023-04-21 DIAGNOSIS — I739 Peripheral vascular disease, unspecified: Secondary | ICD-10-CM | POA: Diagnosis not present

## 2023-04-21 DIAGNOSIS — J9382 Other air leak: Secondary | ICD-10-CM | POA: Diagnosis not present

## 2023-04-21 DIAGNOSIS — G43909 Migraine, unspecified, not intractable, without status migrainosus: Secondary | ICD-10-CM | POA: Diagnosis not present

## 2023-04-21 HISTORY — DX: Unspecified osteoarthritis, unspecified site: M19.90

## 2023-04-21 HISTORY — DX: Nonrheumatic aortic (valve) stenosis: I35.0

## 2023-04-21 HISTORY — DX: Dyspnea, unspecified: R06.00

## 2023-04-21 HISTORY — DX: Solitary pulmonary nodule: R91.1

## 2023-04-21 LAB — CBC
HCT: 40.9 % (ref 36.0–46.0)
Hemoglobin: 12.6 g/dL (ref 12.0–15.0)
MCH: 27.9 pg (ref 26.0–34.0)
MCHC: 30.8 g/dL (ref 30.0–36.0)
MCV: 90.5 fL (ref 80.0–100.0)
Platelets: 209 10*3/uL (ref 150–400)
RBC: 4.52 MIL/uL (ref 3.87–5.11)
RDW: 15.5 % (ref 11.5–15.5)
WBC: 5.8 10*3/uL (ref 4.0–10.5)
nRBC: 0 % (ref 0.0–0.2)

## 2023-04-21 LAB — COMPREHENSIVE METABOLIC PANEL
ALT: 17 U/L (ref 0–44)
AST: 21 U/L (ref 15–41)
Albumin: 3.6 g/dL (ref 3.5–5.0)
Alkaline Phosphatase: 56 U/L (ref 38–126)
Anion gap: 11 (ref 5–15)
BUN: 18 mg/dL (ref 8–23)
CO2: 24 mmol/L (ref 22–32)
Calcium: 9.2 mg/dL (ref 8.9–10.3)
Chloride: 103 mmol/L (ref 98–111)
Creatinine, Ser: 1.29 mg/dL — ABNORMAL HIGH (ref 0.44–1.00)
GFR, Estimated: 43 mL/min — ABNORMAL LOW (ref >60)
Glucose, Bld: 84 mg/dL (ref 70–99)
Potassium: 4.2 mmol/L (ref 3.5–5.1)
Sodium: 138 mmol/L (ref 135–145)
Total Bilirubin: 0.9 mg/dL (ref 0.3–1.2)
Total Protein: 6.4 g/dL — ABNORMAL LOW (ref 6.5–8.1)

## 2023-04-21 LAB — URINALYSIS, ROUTINE W REFLEX MICROSCOPIC
Bilirubin Urine: NEGATIVE
Glucose, UA: NEGATIVE mg/dL
Hgb urine dipstick: NEGATIVE
Ketones, ur: NEGATIVE mg/dL
Leukocytes,Ua: NEGATIVE
Nitrite: NEGATIVE
Protein, ur: 30 mg/dL — AB
Specific Gravity, Urine: 1.023 (ref 1.005–1.030)
pH: 6 (ref 5.0–8.0)

## 2023-04-21 LAB — PROTIME-INR
INR: 1 (ref 0.8–1.2)
Prothrombin Time: 13.7 s (ref 11.4–15.2)

## 2023-04-21 LAB — SURGICAL PCR SCREEN
MRSA, PCR: NEGATIVE
Staphylococcus aureus: NEGATIVE

## 2023-04-21 LAB — APTT: aPTT: 30 s (ref 24–36)

## 2023-04-21 NOTE — Progress Notes (Signed)
PCP - Dr. Kathleene Hazel. Hall Cardiologist - denies  PPM/ICD - denies   Chest x-ray - 04/21/23 EKG - 04/21/23 Stress Test - 1990's per pt, pt states she was unable to finish the test due to dyspnea, and she thinks it was at [facility] ECHO - 03/19/23 Cardiac Cath - denies  Sleep Study - denies   DM- denies  Blood Thinner Instructions: Hold Plavix 5 days. Last dose 9/13. Aspirin Instructions: n/a  ERAS Protcol - no, NPO   COVID TEST- 04/21/23   Anesthesia review: yes, moderate aortic stenosis noted on ECHO on 03/19/23  Patient denies shortness of breath, fever, cough and chest pain at PAT appointment   All instructions explained to the patient, with a verbal understanding of the material. Patient agrees to go over the instructions while at home for a better understanding. Patient also instructed to wear a mask in public after being tested for COVID-19. The opportunity to ask questions was provided.

## 2023-04-21 NOTE — Progress Notes (Addendum)
PAT lab unable to obtain ABG. Pt will need ABG on day of surgery. Order placed.

## 2023-04-22 ENCOUNTER — Encounter (HOSPITAL_COMMUNITY): Payer: Self-pay

## 2023-04-23 ENCOUNTER — Inpatient Hospital Stay (HOSPITAL_COMMUNITY): Payer: Medicare HMO | Admitting: Physician Assistant

## 2023-04-23 ENCOUNTER — Inpatient Hospital Stay (HOSPITAL_COMMUNITY)
Admission: RE | Admit: 2023-04-23 | Discharge: 2023-04-28 | DRG: 164 | Disposition: A | Discharge status: Home / Self Care | Payer: Medicare HMO | Source: Ambulatory Visit | Attending: Thoracic Surgery (Cardiothoracic Vascular Surgery) | Admitting: Thoracic Surgery (Cardiothoracic Vascular Surgery)

## 2023-04-23 ENCOUNTER — Other Ambulatory Visit: Payer: Self-pay

## 2023-04-23 ENCOUNTER — Encounter (HOSPITAL_COMMUNITY): Payer: Self-pay | Admitting: Thoracic Surgery (Cardiothoracic Vascular Surgery)

## 2023-04-23 ENCOUNTER — Encounter (HOSPITAL_COMMUNITY)
Admission: RE | Disposition: A | Discharge status: Home / Self Care | Payer: Self-pay | Source: Ambulatory Visit | Attending: Thoracic Surgery (Cardiothoracic Vascular Surgery)

## 2023-04-23 ENCOUNTER — Inpatient Hospital Stay (HOSPITAL_COMMUNITY): Payer: Medicare HMO | Admitting: Certified Registered Nurse Anesthetist

## 2023-04-23 ENCOUNTER — Inpatient Hospital Stay (HOSPITAL_COMMUNITY): Payer: Medicare HMO

## 2023-04-23 DIAGNOSIS — Z1152 Encounter for screening for COVID-19: Secondary | ICD-10-CM | POA: Diagnosis not present

## 2023-04-23 DIAGNOSIS — Z4682 Encounter for fitting and adjustment of non-vascular catheter: Secondary | ICD-10-CM | POA: Diagnosis not present

## 2023-04-23 DIAGNOSIS — J449 Chronic obstructive pulmonary disease, unspecified: Secondary | ICD-10-CM | POA: Diagnosis not present

## 2023-04-23 DIAGNOSIS — N1831 Chronic kidney disease, stage 3a: Secondary | ICD-10-CM | POA: Diagnosis present

## 2023-04-23 DIAGNOSIS — I350 Nonrheumatic aortic (valve) stenosis: Secondary | ICD-10-CM | POA: Diagnosis not present

## 2023-04-23 DIAGNOSIS — I4819 Other persistent atrial fibrillation: Secondary | ICD-10-CM

## 2023-04-23 DIAGNOSIS — J939 Pneumothorax, unspecified: Secondary | ICD-10-CM | POA: Diagnosis not present

## 2023-04-23 DIAGNOSIS — Z87891 Personal history of nicotine dependence: Secondary | ICD-10-CM | POA: Diagnosis not present

## 2023-04-23 DIAGNOSIS — E7800 Pure hypercholesterolemia, unspecified: Secondary | ICD-10-CM | POA: Diagnosis present

## 2023-04-23 DIAGNOSIS — J9382 Other air leak: Secondary | ICD-10-CM | POA: Diagnosis not present

## 2023-04-23 DIAGNOSIS — R590 Localized enlarged lymph nodes: Secondary | ICD-10-CM | POA: Diagnosis present

## 2023-04-23 DIAGNOSIS — I739 Peripheral vascular disease, unspecified: Secondary | ICD-10-CM | POA: Diagnosis present

## 2023-04-23 DIAGNOSIS — F17210 Nicotine dependence, cigarettes, uncomplicated: Secondary | ICD-10-CM

## 2023-04-23 DIAGNOSIS — I129 Hypertensive chronic kidney disease with stage 1 through stage 4 chronic kidney disease, or unspecified chronic kidney disease: Secondary | ICD-10-CM | POA: Diagnosis not present

## 2023-04-23 DIAGNOSIS — Z886 Allergy status to analgesic agent status: Secondary | ICD-10-CM

## 2023-04-23 DIAGNOSIS — Z91030 Bee allergy status: Secondary | ICD-10-CM

## 2023-04-23 DIAGNOSIS — Z79899 Other long term (current) drug therapy: Secondary | ICD-10-CM | POA: Diagnosis not present

## 2023-04-23 DIAGNOSIS — C3412 Malignant neoplasm of upper lobe, left bronchus or lung: Principal | ICD-10-CM | POA: Diagnosis present

## 2023-04-23 DIAGNOSIS — G43909 Migraine, unspecified, not intractable, without status migrainosus: Secondary | ICD-10-CM | POA: Diagnosis not present

## 2023-04-23 DIAGNOSIS — E785 Hyperlipidemia, unspecified: Secondary | ICD-10-CM | POA: Diagnosis not present

## 2023-04-23 DIAGNOSIS — R911 Solitary pulmonary nodule: Secondary | ICD-10-CM

## 2023-04-23 DIAGNOSIS — T797XXA Traumatic subcutaneous emphysema, initial encounter: Secondary | ICD-10-CM | POA: Diagnosis not present

## 2023-04-23 DIAGNOSIS — I700 Atherosclerosis of aorta: Secondary | ICD-10-CM | POA: Diagnosis not present

## 2023-04-23 DIAGNOSIS — Z7902 Long term (current) use of antithrombotics/antiplatelets: Secondary | ICD-10-CM

## 2023-04-23 DIAGNOSIS — Z9889 Other specified postprocedural states: Secondary | ICD-10-CM | POA: Diagnosis not present

## 2023-04-23 DIAGNOSIS — R918 Other nonspecific abnormal finding of lung field: Secondary | ICD-10-CM | POA: Diagnosis not present

## 2023-04-23 DIAGNOSIS — I10 Essential (primary) hypertension: Secondary | ICD-10-CM

## 2023-04-23 DIAGNOSIS — M1990 Unspecified osteoarthritis, unspecified site: Secondary | ICD-10-CM | POA: Diagnosis not present

## 2023-04-23 DIAGNOSIS — K219 Gastro-esophageal reflux disease without esophagitis: Secondary | ICD-10-CM | POA: Diagnosis not present

## 2023-04-23 DIAGNOSIS — J40 Bronchitis, not specified as acute or chronic: Secondary | ICD-10-CM | POA: Diagnosis not present

## 2023-04-23 DIAGNOSIS — J9811 Atelectasis: Secondary | ICD-10-CM | POA: Diagnosis not present

## 2023-04-23 DIAGNOSIS — Z902 Acquired absence of lung [part of]: Principal | ICD-10-CM

## 2023-04-23 DIAGNOSIS — Z48813 Encounter for surgical aftercare following surgery on the respiratory system: Secondary | ICD-10-CM | POA: Diagnosis not present

## 2023-04-23 HISTORY — PX: LYMPH NODE BIOPSY: SHX201

## 2023-04-23 HISTORY — PX: INTERCOSTAL NERVE BLOCK: SHX5021

## 2023-04-23 HISTORY — PX: VIDEO BRONCHOSCOPY WITH ENDOBRONCHIAL ULTRASOUND: SHX6177

## 2023-04-23 LAB — POCT I-STAT 7, (LYTES, BLD GAS, ICA,H+H)
Acid-base deficit: 4 mmol/L — ABNORMAL HIGH (ref 0.0–2.0)
Bicarbonate: 24 mmol/L (ref 20.0–28.0)
Calcium, Ion: 1.26 mmol/L (ref 1.15–1.40)
HCT: 35 % — ABNORMAL LOW (ref 36.0–46.0)
Hemoglobin: 11.9 g/dL — ABNORMAL LOW (ref 12.0–15.0)
O2 Saturation: 98 %
Potassium: 4.4 mmol/L (ref 3.5–5.1)
Sodium: 140 mmol/L (ref 135–145)
TCO2: 26 mmol/L (ref 22–32)
pCO2 arterial: 54.1 mmHg — ABNORMAL HIGH (ref 32–48)
pH, Arterial: 7.254 — ABNORMAL LOW (ref 7.35–7.45)
pO2, Arterial: 114 mmHg — ABNORMAL HIGH (ref 83–108)

## 2023-04-23 LAB — BLOOD GAS, ARTERIAL
Acid-Base Excess: 0.3 mmol/L (ref 0.0–2.0)
Bicarbonate: 24.6 mmol/L (ref 20.0–28.0)
Drawn by: 45447
O2 Saturation: 99.5 %
Patient temperature: 37
pCO2 arterial: 38 mmHg (ref 32–48)
pH, Arterial: 7.42 (ref 7.35–7.45)
pO2, Arterial: 102 mmHg (ref 83–108)

## 2023-04-23 LAB — PREPARE RBC (CROSSMATCH)

## 2023-04-23 SURGERY — WEDGE RESECTION, LUNG, ROBOT-ASSISTED, THORACOSCOPIC
Anesthesia: General | Site: Chest

## 2023-04-23 MED ORDER — BUPIVACAINE HCL (PF) 0.5 % IJ SOLN
INTRAMUSCULAR | Status: AC
  Filled 2023-04-23: qty 30

## 2023-04-23 MED ORDER — ROCURONIUM BROMIDE 10 MG/ML (PF) SYRINGE
PREFILLED_SYRINGE | INTRAVENOUS | Status: AC
  Filled 2023-04-23: qty 20

## 2023-04-23 MED ORDER — CEFAZOLIN SODIUM-DEXTROSE 2-4 GM/100ML-% IV SOLN
2.0000 g | INTRAVENOUS | Status: AC
  Administered 2023-04-23: 2 g via INTRAVENOUS
  Filled 2023-04-23: qty 100

## 2023-04-23 MED ORDER — FENTANYL CITRATE (PF) 250 MCG/5ML IJ SOLN
INTRAMUSCULAR | Status: DC | PRN
  Administered 2023-04-23 (×3): 50 ug via INTRAVENOUS
  Administered 2023-04-23: 100 ug via INTRAVENOUS

## 2023-04-23 MED ORDER — LACTATED RINGERS IV SOLN: INTRAVENOUS | Status: DC

## 2023-04-23 MED ORDER — MIDAZOLAM HCL 2 MG/2ML IJ SOLN
INTRAMUSCULAR | Status: AC
  Filled 2023-04-23: qty 2

## 2023-04-23 MED ORDER — LIDOCAINE 2% (20 MG/ML) 5 ML SYRINGE
INTRAMUSCULAR | Status: DC | PRN
  Administered 2023-04-23: 60 mg via INTRAVENOUS

## 2023-04-23 MED ORDER — GABAPENTIN 300 MG PO CAPS
300.0000 mg | ORAL_CAPSULE | Freq: Two times a day (BID) | ORAL | Status: DC
  Administered 2023-04-26 – 2023-04-28 (×4): 300 mg via ORAL
  Filled 2023-04-23 (×4): qty 1

## 2023-04-23 MED ORDER — DEXMEDETOMIDINE HCL IN NACL 80 MCG/20ML IV SOLN
INTRAVENOUS | Status: AC
  Filled 2023-04-23: qty 20

## 2023-04-23 MED ORDER — PROPOFOL 10 MG/ML IV BOLUS
INTRAVENOUS | Status: DC | PRN
  Administered 2023-04-23 (×2): 50 mg via INTRAVENOUS
  Administered 2023-04-23: 20 mg via INTRAVENOUS
  Administered 2023-04-23: 70 mg via INTRAVENOUS
  Administered 2023-04-23 (×2): 20 mg via INTRAVENOUS
  Administered 2023-04-23: 70 mg via INTRAVENOUS

## 2023-04-23 MED ORDER — FENTANYL CITRATE (PF) 250 MCG/5ML IJ SOLN
INTRAMUSCULAR | Status: AC
  Filled 2023-04-23: qty 5

## 2023-04-23 MED ORDER — LIDOCAINE 2% (20 MG/ML) 5 ML SYRINGE
INTRAMUSCULAR | Status: AC
  Filled 2023-04-23: qty 5

## 2023-04-23 MED ORDER — CEFAZOLIN SODIUM-DEXTROSE 2-4 GM/100ML-% IV SOLN
2.0000 g | Freq: Three times a day (TID) | INTRAVENOUS | Status: AC
  Administered 2023-04-23 – 2023-04-24 (×2): 2 g via INTRAVENOUS
  Filled 2023-04-23 (×2): qty 100

## 2023-04-23 MED ORDER — 0.9 % SODIUM CHLORIDE (POUR BTL) OPTIME
TOPICAL | Status: DC | PRN
  Administered 2023-04-23: 1000 mL

## 2023-04-23 MED ORDER — DEXAMETHASONE SODIUM PHOSPHATE 10 MG/ML IJ SOLN
INTRAMUSCULAR | Status: AC
  Filled 2023-04-23: qty 1

## 2023-04-23 MED ORDER — INDOCYANINE GREEN 25 MG IV SOLR
INTRAVENOUS | Status: AC
  Filled 2023-04-23: qty 10

## 2023-04-23 MED ORDER — SODIUM CHLORIDE 0.9 % IV SOLN
INTRAVENOUS | Status: AC | PRN
  Administered 2023-04-23: 1000 mL

## 2023-04-23 MED ORDER — ENOXAPARIN SODIUM 40 MG/0.4ML IJ SOSY
40.0000 mg | PREFILLED_SYRINGE | Freq: Every day | INTRAMUSCULAR | Status: DC
  Administered 2023-04-24 – 2023-04-27 (×4): 40 mg via SUBCUTANEOUS
  Filled 2023-04-23 (×4): qty 0.4

## 2023-04-23 MED ORDER — LORATADINE 10 MG PO TABS
10.0000 mg | ORAL_TABLET | Freq: Every day | ORAL | Status: DC
  Administered 2023-04-24 – 2023-04-28 (×5): 10 mg via ORAL
  Filled 2023-04-23 (×5): qty 1

## 2023-04-23 MED ORDER — EPINEPHRINE PF 1 MG/ML IJ SOLN
INTRAMUSCULAR | Status: DC | PRN
  Administered 2023-04-23: 1 mL via ENDOTRACHEOPULMONARY

## 2023-04-23 MED ORDER — BUPIVACAINE LIPOSOME 1.3 % IJ SUSP
INTRAMUSCULAR | Status: AC
  Filled 2023-04-23: qty 20

## 2023-04-23 MED ORDER — CHLORHEXIDINE GLUCONATE CLOTH 2 % EX PADS
6.0000 | MEDICATED_PAD | Freq: Every day | CUTANEOUS | Status: DC
  Administered 2023-04-23 – 2023-04-24 (×2): 6 via TOPICAL

## 2023-04-23 MED ORDER — SUGAMMADEX SODIUM 200 MG/2ML IV SOLN
INTRAVENOUS | Status: DC | PRN
  Administered 2023-04-23: 138.8 mg via INTRAVENOUS

## 2023-04-23 MED ORDER — METHYLENE BLUE (ANTIDOTE) 1 % IV SOLN
INTRAVENOUS | Status: AC
  Filled 2023-04-23: qty 10

## 2023-04-23 MED ORDER — GABAPENTIN 300 MG PO CAPS
300.0000 mg | ORAL_CAPSULE | Freq: Every day | ORAL | Status: AC
  Administered 2023-04-23 – 2023-04-25 (×3): 300 mg via ORAL
  Filled 2023-04-23 (×3): qty 1

## 2023-04-23 MED ORDER — IPRATROPIUM-ALBUTEROL 0.5-2.5 (3) MG/3ML IN SOLN: 3.0000 mL | Freq: Four times a day (QID) | RESPIRATORY_TRACT | Status: DC

## 2023-04-23 MED ORDER — PHENYLEPHRINE HCL-NACL 20-0.9 MG/250ML-% IV SOLN
INTRAVENOUS | Status: DC | PRN
  Administered 2023-04-23: 10 ug/min via INTRAVENOUS

## 2023-04-23 MED ORDER — DEXMEDETOMIDINE HCL IN NACL 80 MCG/20ML IV SOLN
INTRAVENOUS | Status: DC | PRN
Start: 2023-04-23 — End: 2023-04-23
  Administered 2023-04-23 (×2): 8 ug via INTRAVENOUS

## 2023-04-23 MED ORDER — ONDANSETRON HCL 4 MG/2ML IJ SOLN
INTRAMUSCULAR | Status: AC
  Filled 2023-04-23: qty 2

## 2023-04-23 MED ORDER — ACETAMINOPHEN 160 MG/5ML PO SOLN
1000.0000 mg | Freq: Four times a day (QID) | ORAL | Status: DC
  Administered 2023-04-26: 650 mg via ORAL
  Filled 2023-04-23: qty 40.6

## 2023-04-23 MED ORDER — OXYCODONE HCL 5 MG PO TABS: 5.0000 mg | ORAL_TABLET | Freq: Once | ORAL | Status: DC | PRN

## 2023-04-23 MED ORDER — HEMOSTATIC AGENTS (NO CHARGE) OPTIME
TOPICAL | Status: DC | PRN
Start: 2023-04-23 — End: 2023-04-23
  Administered 2023-04-23: 1 via TOPICAL

## 2023-04-23 MED ORDER — EPINEPHRINE PF 1 MG/ML IJ SOLN
INTRAMUSCULAR | Status: AC
  Filled 2023-04-23: qty 1

## 2023-04-23 MED ORDER — ETOMIDATE 2 MG/ML IV SOLN
INTRAVENOUS | Status: AC
  Filled 2023-04-23: qty 10

## 2023-04-23 MED ORDER — GLYCOPYRROLATE PF 0.2 MG/ML IJ SOSY
PREFILLED_SYRINGE | INTRAMUSCULAR | Status: AC
  Filled 2023-04-23: qty 1

## 2023-04-23 MED ORDER — SENNOSIDES-DOCUSATE SODIUM 8.6-50 MG PO TABS
1.0000 | ORAL_TABLET | Freq: Every day | ORAL | Status: DC
  Administered 2023-04-24 – 2023-04-27 (×4): 1 via ORAL
  Filled 2023-04-23 (×4): qty 1

## 2023-04-23 MED ORDER — CHLORHEXIDINE GLUCONATE 0.12 % MT SOLN
OROMUCOSAL | Status: AC
  Administered 2023-04-23: 15 mL via OROMUCOSAL
  Filled 2023-04-23: qty 15

## 2023-04-23 MED ORDER — OXYCODONE HCL 5 MG/5ML PO SOLN: 5.0000 mg | Freq: Once | ORAL | Status: DC | PRN

## 2023-04-23 MED ORDER — ROCURONIUM BROMIDE 10 MG/ML (PF) SYRINGE
PREFILLED_SYRINGE | INTRAVENOUS | Status: DC | PRN
  Administered 2023-04-23: 20 mg via INTRAVENOUS
  Administered 2023-04-23: 30 mg via INTRAVENOUS
  Administered 2023-04-23: 50 mg via INTRAVENOUS

## 2023-04-23 MED ORDER — ACETAMINOPHEN 500 MG PO TABS
1000.0000 mg | ORAL_TABLET | Freq: Four times a day (QID) | ORAL | Status: DC
  Administered 2023-04-23 – 2023-04-28 (×13): 1000 mg via ORAL
  Filled 2023-04-23 (×13): qty 2

## 2023-04-23 MED ORDER — ETOMIDATE 2 MG/ML IV SOLN
INTRAVENOUS | Status: DC | PRN
  Administered 2023-04-23: 20 mg via INTRAVENOUS

## 2023-04-23 MED ORDER — ONDANSETRON HCL 4 MG/2ML IJ SOLN
INTRAMUSCULAR | Status: DC | PRN
  Administered 2023-04-23: 4 mg via INTRAVENOUS

## 2023-04-23 MED ORDER — ONDANSETRON HCL 4 MG/2ML IJ SOLN
4.0000 mg | Freq: Four times a day (QID) | INTRAMUSCULAR | Status: DC | PRN
  Administered 2023-04-26 – 2023-04-28 (×2): 4 mg via INTRAVENOUS
  Filled 2023-04-23 (×3): qty 2

## 2023-04-23 MED ORDER — ORAL CARE MOUTH RINSE: 15.0000 mL | Freq: Once | OROMUCOSAL | Status: AC

## 2023-04-23 MED ORDER — ONDANSETRON HCL 4 MG/2ML IJ SOLN: 4.0000 mg | Freq: Four times a day (QID) | INTRAMUSCULAR | Status: DC | PRN

## 2023-04-23 MED ORDER — FENTANYL CITRATE (PF) 100 MCG/2ML IJ SOLN: 25.0000 ug | INTRAMUSCULAR | Status: DC | PRN

## 2023-04-23 MED ORDER — ROCURONIUM BROMIDE 10 MG/ML (PF) SYRINGE
PREFILLED_SYRINGE | INTRAVENOUS | Status: AC
  Filled 2023-04-23: qty 10

## 2023-04-23 MED ORDER — PANTOPRAZOLE SODIUM 40 MG PO TBEC
40.0000 mg | DELAYED_RELEASE_TABLET | Freq: Every day | ORAL | Status: DC
  Administered 2023-04-24 – 2023-04-28 (×5): 40 mg via ORAL
  Filled 2023-04-23 (×5): qty 1

## 2023-04-23 MED ORDER — OXYCODONE HCL 5 MG PO TABS
5.0000 mg | ORAL_TABLET | ORAL | Status: DC | PRN
  Filled 2023-04-23: qty 2

## 2023-04-23 MED ORDER — PHENYLEPHRINE 80 MCG/ML (10ML) SYRINGE FOR IV PUSH (FOR BLOOD PRESSURE SUPPORT)
PREFILLED_SYRINGE | INTRAVENOUS | Status: AC
  Filled 2023-04-23: qty 10

## 2023-04-23 MED ORDER — DEXAMETHASONE SODIUM PHOSPHATE 10 MG/ML IJ SOLN
INTRAMUSCULAR | Status: DC | PRN
  Administered 2023-04-23: 10 mg via INTRAVENOUS

## 2023-04-23 MED ORDER — CHLORHEXIDINE GLUCONATE 0.12 % MT SOLN: 15.0000 mL | Freq: Once | OROMUCOSAL | Status: AC

## 2023-04-23 MED ORDER — MORPHINE SULFATE (PF) 2 MG/ML IV SOLN: 2.0000 mg | INTRAVENOUS | Status: DC | PRN

## 2023-04-23 MED ORDER — TRAMADOL HCL 50 MG PO TABS
50.0000 mg | ORAL_TABLET | Freq: Four times a day (QID) | ORAL | Status: DC | PRN
  Administered 2023-04-23 – 2023-04-27 (×8): 100 mg via ORAL
  Filled 2023-04-23 (×8): qty 2

## 2023-04-23 MED ORDER — MIDAZOLAM HCL 2 MG/2ML IJ SOLN
INTRAMUSCULAR | Status: DC | PRN
  Administered 2023-04-23: 2 mg via INTRAVENOUS

## 2023-04-23 MED ORDER — PROPOFOL 10 MG/ML IV BOLUS
INTRAVENOUS | Status: AC
  Filled 2023-04-23: qty 20

## 2023-04-23 MED ORDER — ROSUVASTATIN CALCIUM 20 MG PO TABS
40.0000 mg | ORAL_TABLET | Freq: Every day | ORAL | Status: DC
  Administered 2023-04-24 – 2023-04-27 (×4): 40 mg via ORAL
  Filled 2023-04-23 (×4): qty 2

## 2023-04-23 MED ORDER — BUPIVACAINE LIPOSOME 1.3 % IJ SUSP
INTRAMUSCULAR | Status: DC | PRN
  Administered 2023-04-23: 100 mL

## 2023-04-23 MED ORDER — BISACODYL 5 MG PO TBEC
10.0000 mg | DELAYED_RELEASE_TABLET | Freq: Every day | ORAL | Status: DC
  Administered 2023-04-24 – 2023-04-28 (×5): 10 mg via ORAL
  Filled 2023-04-23 (×5): qty 2

## 2023-04-23 MED ORDER — PHENYLEPHRINE 80 MCG/ML (10ML) SYRINGE FOR IV PUSH (FOR BLOOD PRESSURE SUPPORT)
PREFILLED_SYRINGE | INTRAVENOUS | Status: DC | PRN
  Administered 2023-04-23 (×2): 80 ug via INTRAVENOUS
  Administered 2023-04-23 (×3): 160 ug via INTRAVENOUS

## 2023-04-23 MED ORDER — DEXTROSE-SODIUM CHLORIDE 5-0.45 % IV SOLN: INTRAVENOUS | Status: DC

## 2023-04-23 SURGICAL SUPPLY — 122 items
ADAPTER VALVE BIOPSY EBUS (MISCELLANEOUS) IMPLANT
ADH SKN CLS APL DERMABOND .7 (GAUZE/BANDAGES/DRESSINGS) ×3
ADPTR VALVE BIOPSY EBUS (MISCELLANEOUS)
APPLIER CLIP ROT 10 11.4 M/L (STAPLE)
APR CLP MED LRG 11.4X10 (STAPLE)
BAG TISS RTRVL C300 12X14 (MISCELLANEOUS) ×3
BLADE CLIPPER SURG (BLADE) ×4 IMPLANT
BRUSH CYTOL CELLEBRITY 1.5X140 (MISCELLANEOUS) IMPLANT
CANISTER SUCT 3000ML PPV (MISCELLANEOUS) ×12 IMPLANT
CANNULA REDUCER 12-8 DVNC XI (CANNULA) ×8 IMPLANT
CLIP APPLIE ROT 10 11.4 M/L (STAPLE) IMPLANT
CNTNR URN SCR LID CUP LEK RST (MISCELLANEOUS) ×20 IMPLANT
CONN ST 1/4X3/8 BEN (MISCELLANEOUS) IMPLANT
CONT SPEC 4OZ STRL OR WHT (MISCELLANEOUS) ×33
COVER BACK TABLE 60X90IN (DRAPES) ×4 IMPLANT
DEFOGGER SCOPE WARMER CLEARIFY (MISCELLANEOUS) ×4 IMPLANT
DERMABOND ADVANCED .7 DNX12 (GAUZE/BANDAGES/DRESSINGS) ×4 IMPLANT
DRAIN CHANNEL 28F RND 3/8 FF (WOUND CARE) IMPLANT
DRAIN CHANNEL 32F RND 10.7 FF (WOUND CARE) IMPLANT
DRAPE ARM DVNC X/XI (DISPOSABLE) ×16 IMPLANT
DRAPE COLUMN DVNC XI (DISPOSABLE) ×4 IMPLANT
DRAPE CV SPLIT W-CLR ANES SCRN (DRAPES) ×4 IMPLANT
DRAPE HALF SHEET 40X57 (DRAPES) ×4 IMPLANT
DRAPE INCISE IOBAN 66X45 STRL (DRAPES) IMPLANT
DRAPE ORTHO SPLIT 77X108 STRL (DRAPES) ×3
DRAPE SURG ORHT 6 SPLT 77X108 (DRAPES) ×4 IMPLANT
ELECT BLADE 6.5 EXT (BLADE) IMPLANT
ELECT REM PT RETURN 9FT ADLT (ELECTROSURGICAL) ×3
ELECTRODE REM PT RTRN 9FT ADLT (ELECTROSURGICAL) ×4 IMPLANT
FILTER STRAW FLUID ASPIR (MISCELLANEOUS) IMPLANT
FORCEPS BIOP RJ4 1.8 (CUTTING FORCEPS) IMPLANT
FORCEPS BPLR FENES DVNC XI (FORCEP) IMPLANT
FORCEPS BPLR LNG DVNC XI (INSTRUMENTS) IMPLANT
FORCEPS RADIAL JAW LRG 4 PULM (INSTRUMENTS) IMPLANT
GAUZE 4X4 16PLY ~~LOC~~+RFID DBL (SPONGE) ×4 IMPLANT
GAUZE KITTNER 4X5 RF (MISCELLANEOUS) ×8 IMPLANT
GAUZE SPONGE 4X4 12PLY STRL (GAUZE/BANDAGES/DRESSINGS) ×4 IMPLANT
GLOVE BIO SURGEON STRL SZ7 (GLOVE) IMPLANT
GLOVE BIOGEL PI IND STRL 6.5 (GLOVE) IMPLANT
GLOVE BIOGEL PI IND STRL 7.0 (GLOVE) IMPLANT
GLOVE SS BIOGEL STRL SZ 6 (GLOVE) IMPLANT
GLOVE SS BIOGEL STRL SZ 7.5 (GLOVE) ×12 IMPLANT
GOWN STRL REUS W/ TWL LRG LVL3 (GOWN DISPOSABLE) ×8 IMPLANT
GOWN STRL REUS W/ TWL XL LVL3 (GOWN DISPOSABLE) ×4 IMPLANT
GOWN STRL REUS W/TWL 2XL LVL3 (GOWN DISPOSABLE) ×4 IMPLANT
GOWN STRL REUS W/TWL LRG LVL3 (GOWN DISPOSABLE) ×12
GOWN STRL REUS W/TWL XL LVL3 (GOWN DISPOSABLE) ×15
GRASPER TIP-UP FEN DVNC XI (INSTRUMENTS) IMPLANT
HEMOSTAT SURGICEL 2X14 (HEMOSTASIS) ×12 IMPLANT
IRRIGATION STRYKERFLOW (MISCELLANEOUS) ×4 IMPLANT
IRRIGATOR STRYKERFLOW (MISCELLANEOUS) ×3
KIT BASIN OR (CUSTOM PROCEDURE TRAY) ×4 IMPLANT
KIT CLEAN ENDO COMPLIANCE (KITS) ×8 IMPLANT
KIT SUCTION CATH 14FR (SUCTIONS) IMPLANT
KIT TURNOVER KIT B (KITS) ×8 IMPLANT
MARKER SKIN DUAL TIP RULER LAB (MISCELLANEOUS) ×4 IMPLANT
NDL ASPIRATION VIZISHOT 19G (NEEDLE) IMPLANT
NDL ASPIRATION VIZISHOT 21G (NEEDLE) ×4 IMPLANT
NDL BLUNT 18X1 FOR OR ONLY (NEEDLE) IMPLANT
NDL HYPO 25GX1X1/2 BEV (NEEDLE) ×4 IMPLANT
NDL SPNL 22GX3.5 QUINCKE BK (NEEDLE) ×4 IMPLANT
NEEDLE ASPIRATION VIZISHOT 19G (NEEDLE) IMPLANT
NEEDLE ASPIRATION VIZISHOT 21G (NEEDLE) ×3 IMPLANT
NEEDLE BLUNT 18X1 FOR OR ONLY (NEEDLE) IMPLANT
NEEDLE HYPO 25GX1X1/2 BEV (NEEDLE) ×3 IMPLANT
NEEDLE SPNL 22GX3.5 QUINCKE BK (NEEDLE) ×3 IMPLANT
NS IRRIG 1000ML POUR BTL (IV SOLUTION) ×8 IMPLANT
OIL SILICONE PENTAX (PARTS (SERVICE/REPAIRS)) ×4 IMPLANT
PACK CHEST (CUSTOM PROCEDURE TRAY) ×4 IMPLANT
PAD ARMBOARD 7.5X6 YLW CONV (MISCELLANEOUS) ×16 IMPLANT
PORT ACCESS TROCAR AIRSEAL 12 (TROCAR) ×4 IMPLANT
RELOAD STAPLE 45 2.5 WHT DVNC (STAPLE) IMPLANT
RELOAD STAPLE 45 3.5 BLU DVNC (STAPLE) IMPLANT
RELOAD STAPLE 45 4.3 GRN DVNC (STAPLE) IMPLANT
RELOAD STAPLE 45 4.6 BLK DVNC (STAPLE) IMPLANT
RELOAD STAPLER 2.5X45 WHT DVNC (STAPLE) ×15 IMPLANT
RELOAD STAPLER 3.5X45 BLU DVNC (STAPLE) ×15 IMPLANT
RELOAD STAPLER 4.3X45 GRN DVNC (STAPLE) ×9 IMPLANT
RELOAD STAPLER 45 4.6 BLK DVNC (STAPLE) ×9 IMPLANT
SCISSORS LAP 5X35 DISP (ENDOMECHANICALS) IMPLANT
SEAL UNIV 5-12 XI (MISCELLANEOUS) ×16 IMPLANT
SEALER SYNCHRO 8 IS4000 DVNC (MISCELLANEOUS) IMPLANT
SET TRI-LUMEN FLTR TB AIRSEAL (TUBING) ×4 IMPLANT
SOL ELECTROSURG ANTI STICK (MISCELLANEOUS) ×3
SOLUTION ELECTROSURG ANTI STCK (MISCELLANEOUS) ×4 IMPLANT
SPONGE TONSIL 1 RF SGL (DISPOSABLE) IMPLANT
STAPLER 45 SUREFORM CVD DVNC (STAPLE) IMPLANT
STAPLER RELOAD 2.5X45 WHT DVNC (STAPLE) ×15
STAPLER RELOAD 3.5X45 BLU DVNC (STAPLE) ×15
STAPLER RELOAD 4.3X45 GRN DVNC (STAPLE) ×9
STAPLER RELOAD 45 4.6 BLK DVNC (STAPLE) ×9
SUT PDS AB 3-0 SH 27 (SUTURE) IMPLANT
SUT PROLENE 4 0 RB 1 (SUTURE)
SUT PROLENE 4-0 RB1 .5 CRCL 36 (SUTURE) IMPLANT
SUT SILK 1 MH (SUTURE) ×8 IMPLANT
SUT SILK 2 0 SH (SUTURE) IMPLANT
SUT SILK 2 0SH CR/8 30 (SUTURE) IMPLANT
SUT SILK 3 0SH CR/8 30 (SUTURE) IMPLANT
SUT VIC AB 1 CTX 36 (SUTURE) ×3
SUT VIC AB 1 CTX36XBRD ANBCTR (SUTURE) IMPLANT
SUT VIC AB 2-0 CTX 36 (SUTURE) IMPLANT
SUT VIC AB 3-0 X1 27 (SUTURE) ×8 IMPLANT
SUT VICRYL 0 TIES 12 18 (SUTURE) ×4 IMPLANT
SUT VICRYL 0 UR6 27IN ABS (SUTURE) ×8 IMPLANT
SYR 20ML ECCENTRIC (SYRINGE) ×8 IMPLANT
SYR 20ML LL LF (SYRINGE) ×12 IMPLANT
SYR 3ML LL SCALE MARK (SYRINGE) IMPLANT
SYR 5ML LL (SYRINGE) ×4 IMPLANT
SYR 5ML LUER SLIP (SYRINGE) ×4 IMPLANT
SYSTEM RETRIEVAL ANCHOR 12 (MISCELLANEOUS) IMPLANT
SYSTEM SAHARA CHEST DRAIN ATS (WOUND CARE) ×4 IMPLANT
TAPE CLOTH 4X10 WHT NS (GAUZE/BANDAGES/DRESSINGS) ×4 IMPLANT
TIP APPLICATOR SPRAY EXTEND 16 (VASCULAR PRODUCTS) IMPLANT
TOWEL GREEN STERILE (TOWEL DISPOSABLE) ×12 IMPLANT
TOWEL GREEN STERILE FF (TOWEL DISPOSABLE) ×4 IMPLANT
TRAP SPECIMEN MUCUS 40CC (MISCELLANEOUS) ×4 IMPLANT
TRAY FOLEY MTR SLVR 14FR STAT (SET/KITS/TRAYS/PACK) IMPLANT
TRAY FOLEY MTR SLVR 16FR STAT (SET/KITS/TRAYS/PACK) ×4 IMPLANT
TUBE CONNECTING 20X1/4 (TUBING) ×4 IMPLANT
VALVE BIOPSY SINGLE USE (MISCELLANEOUS) ×4 IMPLANT
VALVE SUCTION BRONCHIO DISP (MISCELLANEOUS) ×4 IMPLANT
WATER STERILE IRR 1000ML POUR (IV SOLUTION) ×8 IMPLANT

## 2023-04-23 NOTE — Interval H&P Note (Signed)
History and Physical Interval Note:  04/23/2023 12:28 PM  Courtney Gill  has presented today for surgery, with the diagnosis of LUL NODULE.  The various methods of treatment have been discussed with the patient and family. After consideration of risks, benefits and other options for treatment, the patient has consented to  Procedure(s): XI ROBOTIC ASSISTED THORACOSCOPY-LEFT UPPER LOBE WEDGE RESECTION, possible lobectomy (Left) VIDEO BRONCHOSCOPY WITH ENDOBRONCHIAL ULTRASOUND (N/A) as a surgical intervention.  The patient's history has been reviewed, patient examined, no change in status, stable for surgery.  I have reviewed the patient's chart and labs.  Questions were answered to the patient's satisfaction.     Loreli Slot

## 2023-04-23 NOTE — Progress Notes (Signed)
Pt reports she was near a woman at church on 04/19/23 who had no symptoms, but tested positive for COVID on 04/20/23. Pt with no symptoms of COVID. Pt Covid test on 04/21/23 negative for COVID. Dr. Chaney Malling with anesthesia notified. No new orders.

## 2023-04-23 NOTE — Anesthesia Procedure Notes (Addendum)
Procedure Name: Intubation Date/Time: 04/23/2023 1:41 PM  Performed by: Camillia Herter, CRNAPre-anesthesia Checklist: Patient identified, Emergency Drugs available, Suction available and Patient being monitored Patient Re-evaluated:Patient Re-evaluated prior to induction Oxygen Delivery Method: Circle System Utilized Preoxygenation: Pre-oxygenation with 100% oxygen Induction Type: IV induction Ventilation: Mask ventilation without difficulty Laryngoscope Size: Mac and 3 Grade View: Grade I Tube type: Oral Tube size: 8.5 mm Number of attempts: 1 Airway Equipment and Method: Stylet Placement Confirmation: ETT inserted through vocal cords under direct vision, positive ETCO2 and breath sounds checked- equal and bilateral Secured at: 21 cm Tube secured with: Tape Dental Injury: Teeth and Oropharynx as per pre-operative assessment

## 2023-04-23 NOTE — Plan of Care (Signed)
  Problem: Activity: Goal: Risk for activity intolerance will decrease Outcome: Progressing   Problem: Respiratory: Goal: Respiratory status will improve Outcome: Progressing   Problem: Pain Management: Goal: Pain level will decrease Outcome: Progressing   Problem: Clinical Measurements: Goal: Diagnostic test results will improve Outcome: Progressing   Problem: Activity: Goal: Risk for activity intolerance will decrease Outcome: Progressing   Problem: Nutrition: Goal: Adequate nutrition will be maintained Outcome: Progressing   Problem: Coping: Goal: Level of anxiety will decrease Outcome: Progressing   Problem: Pain Managment: Goal: General experience of comfort will improve Outcome: Progressing   Problem: Safety: Goal: Ability to remain free from injury will improve Outcome: Progressing

## 2023-04-23 NOTE — Anesthesia Procedure Notes (Addendum)
Procedure Name: Intubation Date/Time: 04/23/2023 2:24 PM  Performed by: Camillia Herter, CRNAPre-anesthesia Checklist: Patient identified, Emergency Drugs available, Suction available and Patient being monitored Patient Re-evaluated:Patient Re-evaluated prior to induction Oxygen Delivery Method: Circle System Utilized Preoxygenation: Pre-oxygenation with 100% oxygen Induction Type: IV induction Ventilation: Mask ventilation without difficulty Laryngoscope Size: Mac and 3 Grade View: Grade I Tube type: Oral Endobronchial tube: Double lumen EBT, EBT position confirmed by fiberoptic bronchoscope and Left and 37 Fr Number of attempts: 1 Airway Equipment and Method: Stylet and Oral airway Placement Confirmation: ETT inserted through vocal cords under direct vision, positive ETCO2 and breath sounds checked- equal and bilateral Secured at: 31 cm Tube secured with: Tape Dental Injury: Teeth and Oropharynx as per pre-operative assessment

## 2023-04-23 NOTE — Hospital Course (Addendum)
      HPI: At time of CT surgical consultation   Ms. Lesko returns to discuss results of her PET/CT and management of her left upper lobe lung nodule.   Samrawit Vandenheuvel is a 77 year old woman with a history of tobacco abuse, COPD, reflux, hypertension, hyperlipidemia, PAD, carotid stent, moderate aortic stenosis, stage III chronic kidney disease, migraines, alpha gal, angioedema, and a left upper lobe lung nodule.   She was found to have a left upper lobe lung nodule on a low-dose CT for lung cancer screening in May.  There was some borderline mediastinal adenopathy.  I saw her in early July and recommended a PET/CT and also an echocardiogram to evaluate her aortic stenosis.   Continues to smoke.  Says she has "cut back."  Short of breath with heavy activity but able to participate in normal activities and walk up a flight of stairs.  No change in appetite or weight loss.  No unusual headaches or visual changes.   Dr. Dorris Fetch evaluated the patient and all relevant studies and recommended proceeding with EBUS, which resection and possible left upper lobectomy.  Hospital course:  The patient was admitted electively and on 04/23/2023 taken to the OR at which time she underwent video bronchoscopy and endobronchial ultrasound, left upper lobe wedge resection, left upper lobe completion lobectomy, lymph node dissection and intercostal nerve block.  She tolerated the procedure well was taken to the postanesthesia care unit in stable condition.  Postoperative hospital course:  Overall the patient has done well.  Pain has been under adequate control using standard measures.  On postop day 1 her chest tube showed a small air leak and he was continued to waterseal an additional 24 hours.  This tube was removed on***and chest x-ray revealed***.  Oxygen was weaned and she maintained good saturations on room air.  She is doing well with routine pulmonary hygiene measures to clear secretions.  Incisions  are noted to be healing well.  She tolerated gradual increase in activity using standard rehab modalities.  Plavix was restarted on postop day #1.  She has maintained good urine output.  He did have a mild increase in her creatinine postoperatively which is being monitored over time.  Pain control has been adequate.  He does have a mild expected acute blood loss anemia which is being monitored clinically.

## 2023-04-23 NOTE — Anesthesia Procedure Notes (Signed)
Arterial Line Insertion Start/End9/19/2024 11:00 AM, 04/23/2023 11:05 AM Performed by: Waynard Edwards, CRNA, CRNA  Preanesthetic checklist: patient identified, IV checked, site marked, risks and benefits discussed, surgical consent, monitors and equipment checked, pre-op evaluation, timeout performed and anesthesia consent Lidocaine 1% used for infiltration Right, radial was placed Catheter size: 20 G Hand hygiene performed , maximum sterile barriers used  and Seldinger technique used Allen's test indicative of satisfactory collateral circulation Attempts: 1 Procedure performed without using ultrasound guided technique. Following insertion, dressing applied and Biopatch. Post procedure assessment: normal and unchanged  Patient tolerated the procedure well with no immediate complications.

## 2023-04-23 NOTE — Transfer of Care (Signed)
Immediate Anesthesia Transfer of Care Note  Patient: Courtney Gill  Procedure(s) Performed: XI ROBOTIC ASSISTED THORACOSCOPY-LEFT UPPER LOBE WEDGE RESECTION AND LEFT UPPER LOBECTOMY (Left: Chest) VIDEO BRONCHOSCOPY WITH ENDOBRONCHIAL ULTRASOUND INTERCOSTAL NERVE BLOCK (Chest) LYMPH NODE BIOPSY (Left: Chest)  Patient Location: PACU  Anesthesia Type:General  Level of Consciousness: awake, pateint uncooperative, and confused  Airway & Oxygen Therapy: Patient Spontanous Breathing and Patient connected to face mask oxygen  Post-op Assessment: Report given to RN, Post -op Vital signs reviewed and stable, Patient moving all extremities X 4, and Patient able to stick tongue midline  Post vital signs: Reviewed and stable  Last Vitals:  Vitals Value Taken Time  BP 150/95 04/23/23 1745  Temp 98.6   Pulse 64 04/23/23 1755  Resp 14 04/23/23 1755  SpO2 100 % 04/23/23 1755  Vitals shown include unfiled device data.  Last Pain:  Vitals:   04/23/23 1035  PainSc: 0-No pain         Complications: No notable events documented.

## 2023-04-23 NOTE — Anesthesia Preprocedure Evaluation (Signed)
Anesthesia Evaluation  Patient identified by MRN, date of birth, ID band Patient awake    Reviewed: Allergy & Precautions, H&P , NPO status , Patient's Chart, lab work & pertinent test results  Airway Mallampati: II   Neck ROM: full    Dental   Pulmonary shortness of breath, Current Smoker and Patient abstained from smoking.   breath sounds clear to auscultation       Cardiovascular hypertension, + Peripheral Vascular Disease   Rhythm:regular Rate:Normal     Neuro/Psych  Headaches    GI/Hepatic ,GERD  ,,  Endo/Other    Renal/GU      Musculoskeletal  (+) Arthritis ,    Abdominal   Peds  Hematology   Anesthesia Other Findings   Reproductive/Obstetrics                             Anesthesia Physical Anesthesia Plan  ASA: 3  Anesthesia Plan: General   Post-op Pain Management:    Induction: Intravenous  PONV Risk Score and Plan: 2 and Ondansetron, Dexamethasone and Treatment may vary due to age or medical condition  Airway Management Planned: Double Lumen EBT  Additional Equipment: Arterial line  Intra-op Plan:   Post-operative Plan: Extubation in OR  Informed Consent: I have reviewed the patients History and Physical, chart, labs and discussed the procedure including the risks, benefits and alternatives for the proposed anesthesia with the patient or authorized representative who has indicated his/her understanding and acceptance.     Dental advisory given  Plan Discussed with: CRNA, Anesthesiologist and Surgeon  Anesthesia Plan Comments:        Anesthesia Quick Evaluation

## 2023-04-23 NOTE — Brief Op Note (Addendum)
04/23/2023  7:17 AM  PATIENT:  Courtney Gill  77 y.o. 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,N0)  PROCEDURE:  Procedure(s): XI ROBOTIC ASSISTED THORACOSCOPY-LEFT UPPER LOBE WEDGE RESECTION AND LEFT UPPER LOBECTOMY (Left) VIDEO BRONCHOSCOPY WITH ENDOBRONCHIAL ULTRASOUND (N/A) INTERCOSTAL NERVE BLOCK LYMPH NODE BIOPSY (Left)  SURGEON:  Surgeons and Role:    * Loreli Slot, MD - Primary  PHYSICIAN ASSISTANT: WAYNE GOLD PA-C, ERIN BARRETT PA-C  ANESTHESIA:   general  EBL:  10 mL   BLOOD ADMINISTERED:none  DRAINS:  28 F CHEST TUBE    LOCAL MEDICATIONS USED:  BUPIVICAINE  and  EXPAREL  SPECIMEN:  Source of Specimen:  LUL WEDGE, COMPLETION LOBECTOMY AND MULTIPLE LN SAMPLES  DISPOSITION OF SPECIMEN:  PATHOLOGY  COUNTS:  YES  TOURNIQUET:  * No tourniquets in log *  DICTATION: .Other Dictation: Dictation Number [id number]  PLAN OF CARE: Admit to inpatient   PATIENT DISPOSITION:  PACU - hemodynamically stable.   Delay start of Pharmacological VTE agent (>24hrs) due to surgical blood loss or risk of bleeding: no  FROZEN: ADENOCARCINOMA

## 2023-04-24 ENCOUNTER — Inpatient Hospital Stay (HOSPITAL_COMMUNITY): Payer: Medicare HMO

## 2023-04-24 ENCOUNTER — Encounter (HOSPITAL_COMMUNITY): Payer: Self-pay | Admitting: Thoracic Surgery (Cardiothoracic Vascular Surgery)

## 2023-04-24 LAB — CBC
HCT: 37.1 % (ref 36.0–46.0)
Hemoglobin: 11.4 g/dL — ABNORMAL LOW (ref 12.0–15.0)
MCH: 27.9 pg (ref 26.0–34.0)
MCHC: 30.7 g/dL (ref 30.0–36.0)
MCV: 90.9 fL (ref 80.0–100.0)
Platelets: 187 10*3/uL (ref 150–400)
RBC: 4.08 MIL/uL (ref 3.87–5.11)
RDW: 16 % — ABNORMAL HIGH (ref 11.5–15.5)
WBC: 11.3 10*3/uL — ABNORMAL HIGH (ref 4.0–10.5)
nRBC: 0 % (ref 0.0–0.2)

## 2023-04-24 LAB — BASIC METABOLIC PANEL
Anion gap: 8 (ref 5–15)
BUN: 22 mg/dL (ref 8–23)
CO2: 25 mmol/L (ref 22–32)
Calcium: 8.9 mg/dL (ref 8.9–10.3)
Chloride: 104 mmol/L (ref 98–111)
Creatinine, Ser: 1.26 mg/dL — ABNORMAL HIGH (ref 0.44–1.00)
GFR, Estimated: 45 mL/min — ABNORMAL LOW (ref >60)
Glucose, Bld: 154 mg/dL — ABNORMAL HIGH (ref 70–99)
Potassium: 4.3 mmol/L (ref 3.5–5.1)
Sodium: 137 mmol/L (ref 135–145)

## 2023-04-24 MED ORDER — IPRATROPIUM-ALBUTEROL 0.5-2.5 (3) MG/3ML IN SOLN
3.0000 mL | RESPIRATORY_TRACT | Status: DC | PRN
  Administered 2023-04-24 – 2023-04-28 (×4): 3 mL via RESPIRATORY_TRACT
  Filled 2023-04-24 (×4): qty 3

## 2023-04-24 MED ORDER — CLOPIDOGREL BISULFATE 75 MG PO TABS
75.0000 mg | ORAL_TABLET | Freq: Every day | ORAL | Status: DC
  Administered 2023-04-25 – 2023-04-28 (×4): 75 mg via ORAL
  Filled 2023-04-24 (×4): qty 1

## 2023-04-24 NOTE — Progress Notes (Addendum)
1 Day Post-Op Procedure(s) (LRB): XI ROBOTIC ASSISTED THORACOSCOPY-LEFT UPPER LOBE WEDGE RESECTION AND LEFT UPPER LOBECTOMY (Left) VIDEO BRONCHOSCOPY WITH ENDOBRONCHIAL ULTRASOUND (N/A) INTERCOSTAL NERVE BLOCK LYMPH NODE BIOPSY (Left) Subjective: Some early nausea, appears resolved  Objective: Vital signs in last 24 hours: Temp:  [97 F (36.1 C)-98.6 F (37 C)] 98.6 F (37 C) (09/20 0433) Pulse Rate:  [60-78] 70 (09/20 0433) Cardiac Rhythm: Normal sinus rhythm (09/19 2000) Resp:  [11-21] 18 (09/20 0433) BP: (114-175)/(56-95) 130/72 (09/20 0433) SpO2:  [91 %-96 %] 93 % (09/20 0433) Weight:  [65.4 kg-69.4 kg] 65.4 kg (09/19 2012)  Hemodynamic parameters for last 24 hours:    Intake/Output from previous day: 09/19 0701 - 09/20 0700 In: 795.4 [I.V.:695.4; IV Piggyback:100] Out: 958 [Urine:790; Blood:10; Chest Tube:158] Intake/Output this shift: No intake/output data recorded.  General appearance: alert, cooperative, and no distress Heart: regular rate and rhythm Lungs: clear to auscultation bilaterally Abdomen: benign Extremities: PAS in place Wound: dressings CDI, incis ok  Lab Results: Recent Labs    04/21/23 1500 04/23/23 1555  WBC 5.8  --   HGB 12.6 11.9*  HCT 40.9 35.0*  PLT 209  --    BMET:  Recent Labs    04/21/23 1500 04/23/23 1555  NA 138 140  K 4.2 4.4  CL 103  --   CO2 24  --   GLUCOSE 84  --   BUN 18  --   CREATININE 1.29*  --   CALCIUM 9.2  --     PT/INR:  Recent Labs    04/21/23 1500  LABPROT 13.7  INR 1.0   ABG    Component Value Date/Time   PHART 7.254 (L) 04/23/2023 1555   HCO3 24.0 04/23/2023 1555   TCO2 26 04/23/2023 1555   ACIDBASEDEF 4.0 (H) 04/23/2023 1555   O2SAT 98 04/23/2023 1555   CBG (last 3)  No results for input(s): "GLUCAP" in the last 72 hours.  Meds Scheduled Meds:  acetaminophen  1,000 mg Oral Q6H   Or   acetaminophen (TYLENOL) oral liquid 160 mg/5 mL  1,000 mg Oral Q6H   bisacodyl  10 mg Oral Daily    enoxaparin (LOVENOX) injection  40 mg Subcutaneous Daily   gabapentin  300 mg Oral QHS   Followed by   Melene Muller ON 04/26/2023] gabapentin  300 mg Oral BID   ipratropium-albuterol  3 mL Nebulization Q6H   loratadine  10 mg Oral Daily   pantoprazole  40 mg Oral Daily   rosuvastatin  40 mg Oral QHS   senna-docusate  1 tablet Oral QHS   Continuous Infusions:  dextrose 5 % and 0.45 % NaCl 100 mL/hr at 04/24/23 0226   PRN Meds:.morphine injection, ondansetron (ZOFRAN) IV, oxyCODONE, traMADol  Xrays DG Chest Port 1 View  Result Date: 04/23/2023 CLINICAL DATA:  Status post lobectomy of lung. Left upper lobe wedge resection. EXAM: PORTABLE CHEST 1 VIEW COMPARISON:  Preoperative exam 04/21/2023 FINDINGS: Postsurgical change in the left hemithorax with chain sutures at the apex and volume loss. Left-sided chest tube is directed towards the apex. No definite pneumothorax. Small amount of subcutaneous emphysema seen on the left chest wall. Chronic bronchial thickening. Normal heart size for technique with stable mediastinal contours. No large pleural effusion IMPRESSION: Postsurgical change in the left hemithorax with volume loss. Left-sided chest tube in place. No definite pneumothorax. Electronically Signed   By: Narda Rutherford M.D.   On: 04/23/2023 19:36    Assessment/Plan: S/P Procedure(s) (LRB): XI ROBOTIC ASSISTED  THORACOSCOPY-LEFT UPPER LOBE WEDGE RESECTION AND LEFT UPPER LOBECTOMY (Left) VIDEO BRONCHOSCOPY WITH ENDOBRONCHIAL ULTRASOUND (N/A) INTERCOSTAL NERVE BLOCK LYMPH NODE BIOPSY (Left) POD#1  1 afeb, S BP variable 114-175 range,mostly on elevated side- was not on anti-hypertensives at home, follow closely,  sinus rhythm- will need to determine timing of restart plavix 2 O2 sats good on RA 3 CT 158 ml recorded, + air leak- cont H2O seal 4 good UOP 5 CXR- no significant ads or effus- no pntx  6 labs pending 7 lovenox for dvt ppx  8 routine pulm hygiene and rehab modalities 9 decrease  IVF rate    LOS: 1 day    Rowe Clack PA-C Pager [phone] 04/24/2023 Patient seen and examined, agree with above Pain well controlled Very small leak Ambulate SCD + enoxaparin IS, cough deep breathe  Viviann Spare C. Dorris Fetch, MD [facility] [phone]

## 2023-04-24 NOTE — Plan of Care (Signed)

## 2023-04-24 NOTE — Discharge Summary (Addendum)
Physician Discharge Summary       [address] [place]       [name] [address]             [phone]    Patient ID: DESARE DIERS MRN: [id number] DOB/AGE: 1946-04-23 77 y.o.  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, N0) Principal Problem:   S/P partial lobectomy of lung Active Problems:   S/P lobectomy of lung   Persistent atrial fibrillation [facility])  Patient Active Problem List   Diagnosis Date Noted   Persistent atrial fibrillation (HCC) 04/28/2023   S/P partial lobectomy of lung 04/23/2023   S/P lobectomy of lung 04/23/2023   Lung nodule 03/24/2023   Sleep behavior disorder, REM 06/02/2018   Loud snoring 06/02/2018   PAD (peripheral artery disease) (HCC) 06/02/2018   Psychophysiological insomnia 06/02/2018   Angioedema 02/28/2013   HTN (hypertension) 02/28/2013   Hyperlipidemia 02/28/2013    Consults: cardiology  Procedure (s):   Operative Report    DATE OF PROCEDURE: 04/23/2023   PREOPERATIVE DIAGNOSIS:  Left upper lobe lung nodule.   POSTOPERATIVE DIAGNOSIS:  Adenocarcinoma of the lung, clinical stage IA (T1, N0).   PROCEDURE:  Bronchoscopy and endobronchial ultrasound with mediastinal lymph node aspirations. Xi robotic-assisted left upper lobe wedge resection/left upper lobectomy, intercostal lymph node dissection and intercostal nerve blocks levels 3 through 10.   SURGEON:  Salvatore Decent. Dorris Fetch, MD   ASSISTANT:  Gershon Crane, PA and Lowella Dandy, [place]   ANESTHESIA:  General.               HPI: At time of CT surgical consultation   Ms. Matty returns to discuss results of her PET/CT and management of her left upper lobe lung nodule.   Anna Grenfell is a 77 year old woman with a history of tobacco abuse, COPD, reflux, hypertension, hyperlipidemia, PAD, carotid stent, moderate aortic stenosis,  stage III chronic kidney disease, migraines, alpha gal, angioedema, and a left upper lobe lung nodule.   She was found to have a left upper lobe lung nodule on a low-dose CT for lung cancer screening in May.  There was some borderline mediastinal adenopathy.  I saw her in early July and recommended a PET/CT and also an echocardiogram to evaluate her aortic stenosis.   Continues to smoke.  Says she has "cut back."  Short of breath with heavy activity but able to participate in normal activities and walk up a flight of stairs.  No change in appetite or weight loss.  No unusual headaches or visual changes.   Dr. Dorris Fetch evaluated the patient and all relevant studies and recommended proceeding with EBUS, which resection and possible left upper lobectomy.  Hospital course:  The patient was admitted electively and on 04/23/2023 taken to the OR at which time she underwent video bronchoscopy and endobronchial ultrasound, left upper lobe wedge resection, left upper lobe completion lobectomy, lymph node dissection and intercostal nerve block.  She tolerated the procedure well was taken to the postanesthesia care unit in stable condition.  Postoperative hospital course:  Overall the patient has done well.  Pain has been under adequate control using standard measures.  On postop day 1 her chest tube showed a small air leak and he was continued to waterseal an additional 24 hours.  This tube was removed on 9/23 and chest x-ray revealed no pneumothorax.  Oxygen was weaned and  she maintained good saturations on room air.  She is doing well with routine pulmonary hygiene measures to clear secretions.  She did receive Mucinex to assist with this.  Incisions are noted to be healing well.  She tolerated gradual increase in activity using standard rehab modalities.  Plavix was restarted on postop day #1.  She has maintained good urine output.  He did have a mild increase in her creatinine (she has CKD, stage III)  postoperatively which is being monitored over time. She does have a mild expected acute blood loss anemia which is being monitored clinically. She went into a fib on 09/21 and was put on an Amiodarone drip. She converted to sinus rhythm and was transitioned to oral Amiodarone.  She was seen by cardiology who assisted with management.  She was also started on Eliquis. She had nausea.  It has gradually resolved over time.  She was given scheduled Reglan, and Zofran PRN.  Final pathology is described in the report below.  Overall, at the time of discharge the patient was felt to be quite stable.    Latest Vital Signs: Blood pressure 108/77, pulse 95, temperature 98.3 F (36.8 C), temperature source Oral, resp. rate 20, height 5\' 3"  (1.6 m), weight 65.4 kg, SpO2 93%.  Physical Exam:  General appearance: alert, cooperative, and no distress Heart: irregularly irregular rhythm Lungs: mild coarseness in the bases Abdomen: benign Extremities: no edema or calf tenderness Wound: incis healing well  Discharge Condition: Good  Recent laboratory studies:  Lab Results  Component Value Date   WBC 10.5 04/25/2023   HGB 11.0 (L) 04/25/2023   HCT 35.9 (L) 04/25/2023   MCV 94.2 04/25/2023   PLT 188 04/25/2023   Lab Results  Component Value Date   NA 137 04/26/2023   K 4.2 04/26/2023   CL 104 04/26/2023   CO2 25 04/26/2023   CREATININE 1.14 (H) 04/26/2023   GLUCOSE 112 (H) 04/26/2023    SURGICAL PATHOLOGY CASE: [id number] PATIENT: [facility] Surgical Pathology Report     Clinical History: left upper lobe nodule (cm)     FINAL MICROSCOPIC DIAGNOSIS:  A. LUNG, LEFT UPPER LOBE, WEDGE RESECTION:      Adenocarcinoma, acinar (80%), papillary (10%), solid (5%), lepidic (5%) patterns, with focal extracellular mucin, poorly differentiated.      Tumor size: 2.8 cm in maximal dimension.      Carcinoma invades visceral pleura.      Spread through air spaces identified.      Lymphovascular  invasion is not identified.      See part C for final margin status.      One lymph node, negative for metastatic carcinoma (0/1).      See oncology table.  B. LYMPH NODE, LEVEL 11, EXCISION:      One lymph node, negative for metastatic carcinoma (0/1).  C. LUNG, LEFT UPPER LOBE, LOBECTOMY:      Benign pulmonary parenchyma without significant diagnostic alteration.      Bronchial margin and vascular margin are negative for carcinoma.      Four lymph nodes, negative for metastatic carcinoma (0/4).  D. LYMPH NODE, LEVEL 9, EXCISION:      One lymph node, negative for metastatic carcinoma (0/1).  E. LYMPH NODE, LEVEL 8, EXCISION:      One lymph node, negative for metastatic carcinoma (0/1).  F. LYMPH NODE, LEVEL 7, EXCISION:      One lymph node, negative for metastatic carcinoma (0/1).  G. LYMPH NODE, LEVEL 4L,  EXCISION:      One lymph node, negative for metastatic carcinoma (0/1).  H. LYMPH NODE, LEVEL 5, EXCISION:      One lymph node, negative for metastatic carcinoma (0/1).  I. LYMPH NODE, LEVEL 12, EXCISION:      One lymph node, negative for metastatic carcinoma (0/1).  J. LYMPH NODE, LEVEL 10, EXCISION:      One lymph node, negative for metastatic carcinoma (0/1).  K. LYMPH NODE, LEVEL 12 #2, EXCISION:      One lymph node, negative for metastatic carcinoma (0/1).  L. LYMPH NODE, LEVEL 11 #2, EXCISION:      One lymph node, negative for metastatic carcinoma (0/1).  M. LYMPH NODE, LEVEL 13, EXCISION:      One lymph node, negative for metastatic carcinoma (0/1).  ONCOLOGY TABLE:  LUNG: Resection  Synchronous Tumors: Not applicable Total Number of Primary Tumors: 1 Procedure: Lobectomy Specimen Laterality: Left Tumor Focality: Unifocal Tumor Site: Left upper lobe Tumor Size: 2.8 cm in maximal dimension      Total Tumor Size: 2.8 cm      Invasive Tumor Size (applies only to invasive nonmucinous adenocarcinoma with a lepidic           component): 2.8 cm Histologic  Type: Adenocarcinoma Visceral Pleura Invasion: Identified Direct Invasion of Adjacent Structures: No adjacent structures present Lymphovascular Invasion: Not identified Margins: All margins negative for invasive carcinoma      Closest Margin(s) to Invasive Carcinoma: Cannot be determined, separated specimen      Margin(s) Involved by Invasive Carcinoma: Not applicable       Margin Status for Non-Invasive Tumor: Not applicable Treatment Effect: No known presurgical therapy      Percentage of Residual Viable Tumor: NA Regional Lymph Nodes:      Number of Lymph Nodes Involved: 0                           Nodal Sites with Tumor: NA      Number of Lymph Nodes Examined: 16                      Nodal Sites Examined: Level 4L, 5, 7, 8, 9, 10, 11, 12, 13 Distant Metastasis:      Distant Site(s) Involved: Not applicable Pathologic Stage Classification (pTNM, AJCC 8th Edition): pT1c, pN0 Ancillary Studies: Can be performed upon request Representative Tumor Block: A1, A2, A3 Comment(s): None (v4.2.0.1)   INTRAOPERATIVE CONSULTATION:  A.  Left upper lobe wedge (frozen on nodule): "Adenocarcinoma" Intraoperative diagnosis rendered by Dr. Luisa Hart at 3:40 PM on 04/23/2023.  B.  Level 11 node: "Benign lymph node." Intraoperative diagnosis rendered by Dr. Luisa Hart at 4:37 PM on 04/23/2023.  C.  Left upper lobe-frozen for bronchial margins: "Bronchial margin free" Intraoperative diagnosis rendered by Dr. Venetia Night at 5:24 PM on 04/23/2023.  GROSS DESCRIPTION:  A.  Specimen: Received fresh is "left upper lobe wedge resection" Specimen integrity: Intact Size, weight: 8.2 x 3.7 x 2.4 cm, 22 g Pleura: Pink-purple with a firm area that is over inked blue.  There is a staple line margin that is under inked yellow. Lesion: There is a 2.8 x 1.8 x 1.7 cm tan, firm lesion that abuts the blue inked pleural surface. Margin(s): The lesion is 0.2 cm from the yellow inked staple  line margin. Nonneoplastic parenchyma: Pink-tan and spongy without further distinct lesions Block Summary: A1 frozen section on lesion to both pleura and margin  A2-A3 additional lesion to margin and pleura A4 uninvolved parenchyma  B. Received fresh is a 1.9 x 1.0 x 0.8 cm lymph node candidate.  The candidate is serially sectioned revealing an underlying pink-tan cut surface.  Half of the specimen is submitted for frozen section.  The specimen is entirely submitted as follows: B1 previous frozen section B2 remaining candidate  C. Specimen: Received fresh is "left upper lobe" Specimen integrity: Intact Size, weight: 15 x 7.5 x 2.2 cm, 142 g Pleura: Purple-pink.  There is a staple line present at the presumed site of previous wedge resection.  The staple line is removed and under inked blue. Parenchyma: The specimen is sectioned revealing a pink-tan, spongy parenchyma without grossly distinct lesions Margin(s): Grossly clear of tumor Hilar vessels: Grossly clear of tumor Lymph nodes: 3 lymph node candidates are identified at the hilum ranging from 0.7 to 1.1 cm Block Summary: C1 bronchial margin, frozen section C2 vascular margins C3-C4 site of wedge resection and underlying parenchyma C5 two lymph node candidates, whole C6 one lymph node candidate, bisected   D.  Received fresh is a 0.7 x 0.5 x 0.3 cm disrupted lymph node candidate.  The candidate is entirely submitted in D1.  E.  Received fresh is a disrupted 0.8 x 0.3 x 0.2 cm lymph node candidate.  The candidate is entirely submitted in E1.  F.  Received fresh is a 1.2 x 0.6 x 0.3 cm gray-tan lymph node candidate.  The candidate is entirely submitted in F1.  G.  Received fresh is a fragmented lymph node candidate.  The fragments measure 1.0 x 0.7 x 0.3 cm in aggregate.  The specimen is entirely submitted in G1.  H.  Received fresh is a disrupted lymph node candidate measuring 1.5 x 0.8 x 0.3 cm.  The candidate is  entirely submitted in H1.  I.  Received fresh is a fragmented lymph node candidate.  The fragments measure 0.9 x 0.4 x 0.2 cm in aggregate.  The specimen is entirely submitted in I1.  J.  Received fresh is a 0.5 x 0.3 x 0.2 cm lymph node candidate.  The candidate is entirely submitted in J1.  K.  Received fresh is a fragmented lymph node candidate.  The fragments measure 1.1 x 0.5 x 0.2 cm in aggregate.  The specimen is entirely submitted in K1.  L.  Received fresh is a disrupted/fragmented lymph node candidate.  The fragments measure 1.1 x 0.6 x 0.3 cm in aggregate.  The specimen is entirely submitted in L1.  M.  Received fresh is a fragmented lymph node candidate.  The fragments measure 0.6 x 0.3 x 0.2 cm in aggregate.  The specimen is entirely submitted in M1. (KW, 04/24/2023)  Final Diagnosis performed by Lance Coon, MD.   Electronically signed 04/27/2023     Diagnostic Studies: DG Chest 2 View  Result Date: 04/28/2023 CLINICAL DATA:  S/P lobectomy of lung EXAM: CHEST - 2 VIEW COMPARISON:  Chest x-ray 04/27/2023. FINDINGS: Mild streaky left no visible pneumothorax. Small amount of subcutaneous gas along the left chest wall. Basilar opacities. No visible pleural effusions. Cardiomediastinal silhouette is unchanged. No acute bony abnormality. IMPRESSION: No visible pneumothorax. Small amount of subcutaneous gas along the left chest wall. Mild streaky left basilar opacities, likely atelectasis. Electronically Signed   By: Feliberto Harts M.D.   On: 04/28/2023 09:26   DG CHEST PORT 1 VIEW  Result Date: 04/27/2023 CLINICAL DATA:  Pneumothorax EXAM: PORTABLE CHEST - 1 VIEW COMPARISON:  the previous day's study FINDINGS: Stable left chest tube. No definite pneumothorax. Surgical staples at the left hilum. Stable left lateral subcutaneous emphysema. Coarse perihilar interstitial opacities left greater than right. Heart size and mediastinal contours are within normal limits. Aortic  Atherosclerosis (ICD10-170.0). No effusion. IMPRESSION: 1. Stable left chest tube without pneumothorax. 2. Coarse perihilar interstitial opacities left greater than right. Electronically Signed   By: Corlis Leak M.D.   On: 04/27/2023 07:49   DG CHEST PORT 1 VIEW  Result Date: 04/26/2023 CLINICAL DATA:  77 year old female with history of pneumothorax. EXAM: PORTABLE CHEST 1 VIEW COMPARISON:  Chest x-ray 04/25/2023. FINDINGS: Postoperative changes of left upper lobectomy are noted. Left-sided chest tube in position with tip extending into the apex of the left hemithorax. Trace pneumothorax most evident in the medial aspect of the left base. Small amount of subcutaneous emphysema along the left chest wall incidentally noted. Opacity at the left base favored to reflect postoperative subsegmental atelectasis. Right lung appears clear. No pleural effusions. No evidence of pulmonary edema. Heart size is normal. Upper mediastinal contours are within normal limits. Atherosclerotic calcifications in the thoracic aorta. IMPRESSION: 1. Postoperative changes and support apparatus, as above. Stable position of left-sided chest tube with trace left basilar pneumothorax. 2. Aortic atherosclerosis. Electronically Signed   By: Trudie Reed M.D.   On: 04/26/2023 08:43   DG Chest 1 View  Result Date: 04/25/2023 CLINICAL DATA:  Postop follow-up EXAM: CHEST  1 VIEW COMPARISON:  Yesterday FINDINGS: Small left pneumothorax with chest tube in place, tip at the apex. Chain sutures overlap the left lung. Generalized interstitial coarsening is stable and bronchitic. Retrocardiac atelectasis. Stable heart size and mediastinal contours. IMPRESSION: 1. Left chest tube with small pneumothorax. 2. Mild increase in retrocardiac atelectasis. Electronically Signed   By: Tiburcio Pea M.D.   On: 04/25/2023 08:39   DG Chest Port 1 View  Result Date: 04/24/2023 CLINICAL DATA:  Status post lobectomy of lung. EXAM: PORTABLE CHEST 1 VIEW  COMPARISON:  Radiograph yesterday FINDINGS: Postsurgical change in the left hemithorax with volume loss and chain sutures. Left-sided chest tube directed towards the apex. Trivial left apical pneumothorax. Small amount of subcutaneous emphysema in the left chest wall. Stable heart size and mediastinal contours. No focal right lung abnormality. No large pleural effusion IMPRESSION: Postsurgical change in the left hemithorax. Trace apical pneumothorax with chest tube in place. Electronically Signed   By: Narda Rutherford M.D.   On: 04/24/2023 08:46   DG Chest Port 1 View  Result Date: 04/23/2023 CLINICAL DATA:  Status post lobectomy of lung. Left upper lobe wedge resection. EXAM: PORTABLE CHEST 1 VIEW COMPARISON:  Preoperative exam 04/21/2023 FINDINGS: Postsurgical change in the left hemithorax with chain sutures at the apex and volume loss. Left-sided chest tube is directed towards the apex. No definite pneumothorax. Small amount of subcutaneous emphysema seen on the left chest wall. Chronic bronchial thickening. Normal heart size for technique with stable mediastinal contours. No large pleural effusion IMPRESSION: Postsurgical change in the left hemithorax with volume loss. Left-sided chest tube in place. No definite pneumothorax. Electronically Signed   By: Narda Rutherford M.D.   On: 04/23/2023 19:36   DG Chest 2 View  Result Date: 04/23/2023 CLINICAL DATA:  Preop exam for lung surgery. EXAM: CHEST - 2 VIEW COMPARISON:  April 16, 2022. FINDINGS: The heart size and mediastinal contours are within normal limits. No acute pulmonary abnormality is noted. The visualized skeletal structures are unremarkable. IMPRESSION: No active cardiopulmonary disease.  Electronically Signed   By: Lupita Raider M.D.   On: 04/23/2023 09:37       Discharge Instructions     Discharge patient   Complete by: As directed    Discharge disposition: 01-Home or Self Care   Discharge patient date: 04/28/2023        Discharge Medications: Allergies as of 04/28/2023       Reactions   Bee Venom Anaphylaxis   Wasp Venom Anaphylaxis   Aspirin Swelling        Medication List     TAKE these medications    albuterol 108 (90 Base) MCG/ACT inhaler Commonly known as: VENTOLIN HFA Inhale 2 puffs into the lungs every 4 (four) hours as needed.   amiodarone 200 MG tablet Commonly known as: PACERONE Take 2 tablets (400 mg total) by mouth 2 (two) times daily. For one week;then take 200 mg daily thereafter   apixaban 5 MG Tabs tablet Commonly known as: ELIQUIS Take 1 tablet (5 mg total) by mouth 2 (two) times daily.   cetirizine 10 MG tablet Commonly known as: ZYRTEC Take 10 mg by mouth at bedtime.   clopidogrel 75 MG tablet Commonly known as: PLAVIX Take 1 tablet (75 mg total) by mouth daily with breakfast.   diphenhydrAMINE 25 MG tablet Commonly known as: BENADRYL Take 25 mg by mouth at bedtime.   EPINEPHrine 0.3 mg/0.3 mL Soaj injection Commonly known as: EPI-PEN Inject 0.3 mg into the muscle as needed.   gabapentin 300 MG capsule Commonly known as: NEURONTIN Take 1 capsule (300 mg total) by mouth 2 (two) times daily.   guaiFENesin 600 MG 12 hr tablet Commonly known as: MUCINEX Take 1 tablet (600 mg total) by mouth 2 (two) times daily.   ibuprofen 200 MG tablet Commonly known as: ADVIL Take 400 mg by mouth every 8 (eight) hours as needed (pain).   MAGNESIUM PO Take 1 capsule by mouth in the morning and at bedtime.   metoprolol tartrate 25 MG tablet Commonly known as: LOPRESSOR Take 1 tablet (25 mg total) by mouth 2 (two) times daily.   OMEGA-3 FISH OIL PO Take 1 capsule by mouth in the morning.   pantoprazole 40 MG tablet Commonly known as: PROTONIX Take 40 mg by mouth daily before breakfast.   rosuvastatin 40 MG tablet Commonly known as: CRESTOR Take 40 mg by mouth at bedtime.   Systane 0.4-0.3 % Gel ophthalmic gel Generic drug: Polyethyl Glycol-Propyl  Glycol Place 1 Application into both eyes at bedtime as needed (dry/irritated eyes.).   traMADol 50 MG tablet Commonly known as: ULTRAM Take 1 tablet (50 mg total) by mouth every 6 (six) hours as needed for up to 7 days (mild pain).   VITAMIN B-12 PO Take 1 tablet by mouth in the morning.   VITAMIN D PO Take 1 tablet by mouth in the morning.   vitamin E 180 MG (400 UNITS) capsule Take 400 Units by mouth in the morning.        Follow Up Appointments:  Follow-up Information     Loreli Slot, MD Follow up.   Specialty: Cardiothoracic Surgery Why: Please see discharge paperwork for details of follow-up appointment with surgeon. Contact information: [address] [employer] [address] [place] [address] [phone]          IMAGING Follow up.   Why: On the date you are scheduled to see Dr. Dorris Fetch in the office please obtain a chest x-ray at [facility] 1 hour prior  to that appointment. Contact information: [address] [place] [address]        [facility]. Go on 05/04/2023.   Specialty: Cardiothoracic Surgery Why: Appointment is with nurse for chest tube removal only. Appointment time is at 10:00 am Contact information: [address] [place], [address] [place] [address] [phone]        Ronney Asters, NP Follow up.   Specialty: Cardiology Why: Tuesday May 19, 2023 Appt at 10:05 AM (25 min) Contact information: [address] [place] [address] [phone]                 Signed: Noel Christmas 04/28/2023, 1:36 PM

## 2023-04-24 NOTE — Op Note (Signed)
NAME: Courtney Gill, Courtney Gill MEDICAL RECORD NO: [id number] ACCOUNT NO: [id number] DATE OF BIRTH: 07/11/1946 FACILITY: [facility] LOCATION: [facility] PHYSICIAN: Salvatore Decent. Dorris Fetch, MD  Operative Report   DATE OF PROCEDURE: 04/23/2023  PREOPERATIVE DIAGNOSIS:  Left upper lobe lung nodule.  POSTOPERATIVE DIAGNOSIS:  Adenocarcinoma of the lung, clinical stage IA (T1, N0).  PROCEDURE:   1.Bronchoscopy and endobronchial ultrasound with mediastinal lymph node aspirations.   2.Xi robotic-assisted left upper lobe wedge resection/left upper lobectomy,  Lymph node dissection and  Intercostal nerve blocks levels 3 through 10.  SURGEON:  Salvatore Decent. Dorris Fetch, MD  ASSISTANT:  Gershon Crane, PA and Lowella Dandy, [place]  ANESTHESIA:  General.  FINDINGS:  Aspirations of level 7 and 4R node showed lymphoid cells, but no tumor. Wedge resection of left upper lobe nodule showed adenocarcinoma, level 7 node negative for carcinoma.  CLINICAL NOTE: Mrs. Courtney Gill is a 77 year old woman with a history of tobacco abuse, who was recently found to have a left upper lobe lung nodule on a low dose CT for lung cancer screening.  She also had borderline mediastinal adenopathy.  PET/CT showed the nodule was hypermetabolic.  There was some activity in her mediastinal lymph nodes, but felt to be reactive by radiology.  She was advised to undergo endobronchial ultrasound for aspiration of the lymph nodes and then if those were negative, we would proceed with a left upper lobe wedge resection and possible lobectomy depending on the results of intraoperative frozen section.  The indications, risks, benefits, and alternatives were discussed in detail with the patient.  She understood and accepted the risks and agreed to proceed.  DESCRIPTION OF PROCEDURE: Mrs. Courtney Gill was brought to the preoperative holding area on 04/23/2023.  Anesthesia established intravenous access and placed an arterial blood pressure monitoring line.  She was taken to the  operating room, anesthetized and intubated.  Foley catheter was placed.  Sequential compression devices were placed on the calves for DVT prophylaxis.  A Bair Hugger was placed for active warming.  Intravenous antibiotics were administered.  A timeout was performed.  Flexible fiberoptic bronchoscopy was performed via the endotracheal tube, it revealed normal endobronchial anatomy with no endobronchial lesions to the level of subsegmental bronchi.  The endobronchial ultrasound probe then was advanced. Nodes were noted in the 4R and 7 locations.  Aspirations of level 7 node were performed first followed by the 4R node.  Aspirations were performed using a 21-gauge needle with realtime ultrasound visualization. Specimens were obtained with and without suction applied.  There was minimal bleeding, which cleared rapidly with irrigation with a dilute epinephrine solution. Quick prep of the specimens revealed lymphoid cells with no evidence of carcinoma.  Decision was made to proceed with the robotic portion of the procedure as discussed with the patient preoperatively.  She was reintubated with a double lumen endotracheal tube.  She was placed in a right lateral decubitus position.  A Bair Hugger was placed for active warming.  The left chest was prepped and draped in the usual sterile fashion.  Single lung ventilation of the right lung was initiated and was tolerated well throughout the procedure.  A second timeout was performed.  A solution containing 20 mL of liposomal bupivacaine, 30 mL of 0.5% bupivacaine and 50 mL of saline was prepared.  This solution was used for local at the incision sites as well as for the intercostal nerve blocks.  An incision was made in the eighth interspace in the midaxillary line, and an 8 mm port was  inserted.  The thoracoscope was advanced into the chest.  After confirming intrapleural placement, carbon dioxide was insufflated per protocol.  A 12 mm robotic port was placed in the  eighth interspace anterior to the camera port.  Intercostal nerve blocks then were performed to the third to the tenth interspace by injecting 10 mL of the bupivacaine solution into a subpleural plane at each level.  A 12 mm AirSeal port was placed in the tenth interspace posterolaterally and 2 additional robotic ports were placed in the eighth interspace. The robot was deployed.  The camera arm was docked, targeting was performed.  The remaining arms were docked.  Robotic  instruments were inserted with thoracoscopic visualization.  There was good isolation of the left lung.  There were some thin filmy adhesions, which were taken down with bipolar cautery.  There was a mass clearly evident in the anterior aspect of the left upper lobe.  There was some invagination of visceral pleura.  A wedge resection was performed with sequential firings of the robotic stapler using black cartridges.  The specimen was placed into a 12 mm endoscopic retrieval bag, removed, and sent for frozen section. The lymph node dissection was  initiated while awaiting the results of the frozen section.  Lung was retracted superiorly.  The inferior ligament was divided, and level 8 and 9 nodes were removed.  All lymph nodes were sent as separate specimens.  The most concerning nodes were sent for frozen section, which all came back negative for carcinoma.  The lung was retracted anteriorly and the pleural reflection was divided at the hilum posteriorly.  A relatively enlarged level 7 node was removed and sent for frozen section, which returned  with no tumor seen.  By this point, the frozen section of the lung nodule had returned showing an adenocarcinoma.  The decision was made to proceed with lobectomy as discussed with the patient preoperatively.  The dissection was carried up along the pulmonary artery posteriorly and then the pleural reflection was divided at the hilum superiorly. Level 10 and 5 nodes were removed.  Pleural  reflection was divided at the hilum anteriorly.  The phrenic nerve was identified and care was taken not to use cautery in its vicinity.  There were some adhesions in the fissure, which were taken down with bipolar cautery.  The majority of the anterior part of the fissure was completed without a stapler.  There was a small area that had to be stapled.  There was a markedly  enlarged level 11 node that was sent for frozen section and also returned with no tumor seen.  The pulmonary artery was identified in the fissure and a dissection plane was developed superficial to it.  The remainder of the fissure was completed posteriorly using the robotic stapler.  A level 12 node was dissected out adjacent to the lingular segmental pulmonary artery branches which were then encircled and divided with the stapler.  There was a very small branch of the PA to the left upper lobe, which was too small to comfortably staple.  It was divided using the SynchroSeal device.  Next, attention was turned to the superior pulmonary vein, which was encircled and divided and then the anterior-apical trunk was visible.  It also was divided with the robotic stapler.  The lung then was retracted anteriorly again, exposing the posterior ascending branch, which was divided.  Additional nodes were taken from  around the left upper lobe bronchus.  Stapler then was placed across  the base of the left upper lobe bronchus and closed.  A test inflation showed good aeration of the lower lobe.  The stapler was fired transecting the bronchus.  The vessel loop and sponges used during the dissection were removed.  The chest was copiously irrigated with warm saline.  A test inflation to 30 cm water revealed no air leakage.  The upper lobe was placed into the endoscopic retrieval bag and brought down to the inferior aspect of the chest.  The robotic instruments were removed and the robot was undocked.  The anterior eighth interspace incision was lengthened to  3 cm.  The specimen was removed through it and sent for frozen section of the bronchial margin, which  returned with no tumor seen.  The scope was reinserted.  All port sites were inspected for hemostasis as were the staple lines.  There was no ongoing bleeding. A 28-French Blake drain was placed through the original port incision and secured with #1 Silk suture.  Dual lung ventilation was resumed.  The remaining incisions were closed in standard fashion.  Chest tube was placed to a Pleur-Evac on waterseal.  The patient was placed back in a supine position.  She was extubated in the operating room and taken to the postanesthetic care unit in good condition.  All sponge, needle and instrument counts were correct at the end of the procedure.  Experienced assistance was necessary for this case due to surgical complexity.  Gershon Crane, [place] assisted with the beginning of the case assisting with port placement, camera management, robot docking and undocking, instrument exchange, specimen retrieval and  suctioning.  Erin Barrett then served as the Geophysicist/field seismologist providing assistance with instrument docking and undocking specimen retrieval, suctioning, and wound closure.   PUS D: 04/24/2023 1:16:31 pm T: 04/24/2023 2:32:00 pm  JOB: [id number]/ [id number]

## 2023-04-25 ENCOUNTER — Inpatient Hospital Stay (HOSPITAL_COMMUNITY): Payer: Medicare HMO

## 2023-04-25 LAB — COMPREHENSIVE METABOLIC PANEL
ALT: 10 U/L (ref 0–44)
AST: 20 U/L (ref 15–41)
Albumin: 2.9 g/dL — ABNORMAL LOW (ref 3.5–5.0)
Alkaline Phosphatase: 49 U/L (ref 38–126)
Anion gap: 5 (ref 5–15)
BUN: 18 mg/dL (ref 8–23)
CO2: 26 mmol/L (ref 22–32)
Calcium: 8.5 mg/dL — ABNORMAL LOW (ref 8.9–10.3)
Chloride: 107 mmol/L (ref 98–111)
Creatinine, Ser: 1.29 mg/dL — ABNORMAL HIGH (ref 0.44–1.00)
GFR, Estimated: 43 mL/min — ABNORMAL LOW (ref >60)
Glucose, Bld: 123 mg/dL — ABNORMAL HIGH (ref 70–99)
Potassium: 4 mmol/L (ref 3.5–5.1)
Sodium: 138 mmol/L (ref 135–145)
Total Bilirubin: 0.7 mg/dL (ref 0.3–1.2)
Total Protein: 5.6 g/dL — ABNORMAL LOW (ref 6.5–8.1)

## 2023-04-25 LAB — CBC
HCT: 35.9 % — ABNORMAL LOW (ref 36.0–46.0)
Hemoglobin: 11 g/dL — ABNORMAL LOW (ref 12.0–15.0)
MCH: 28.9 pg (ref 26.0–34.0)
MCHC: 30.6 g/dL (ref 30.0–36.0)
MCV: 94.2 fL (ref 80.0–100.0)
Platelets: 188 10*3/uL (ref 150–400)
RBC: 3.81 MIL/uL — ABNORMAL LOW (ref 3.87–5.11)
RDW: 16.4 % — ABNORMAL HIGH (ref 11.5–15.5)
WBC: 10.5 10*3/uL (ref 4.0–10.5)
nRBC: 0 % (ref 0.0–0.2)

## 2023-04-25 MED ORDER — AMIODARONE HCL IN DEXTROSE 360-4.14 MG/200ML-% IV SOLN
30.0000 mg/h | INTRAVENOUS | Status: DC
  Filled 2023-04-25: qty 200

## 2023-04-25 MED ORDER — GUAIFENESIN ER 600 MG PO TB12
600.0000 mg | ORAL_TABLET | Freq: Two times a day (BID) | ORAL | Status: DC
  Administered 2023-04-25 – 2023-04-28 (×7): 600 mg via ORAL
  Filled 2023-04-25 (×7): qty 1

## 2023-04-25 MED ORDER — AMIODARONE LOAD VIA INFUSION
150.0000 mg | Freq: Once | INTRAVENOUS | Status: AC
  Administered 2023-04-25: 150 mg via INTRAVENOUS
  Filled 2023-04-25: qty 83.34

## 2023-04-25 MED ORDER — AMIODARONE HCL IN DEXTROSE 360-4.14 MG/200ML-% IV SOLN
60.0000 mg/h | INTRAVENOUS | Status: AC
  Administered 2023-04-25 (×2): 60 mg/h via INTRAVENOUS
  Filled 2023-04-25 (×2): qty 200

## 2023-04-25 MED ORDER — AMIODARONE HCL 200 MG PO TABS
400.0000 mg | ORAL_TABLET | Freq: Two times a day (BID) | ORAL | Status: DC
  Administered 2023-04-25 – 2023-04-28 (×7): 400 mg via ORAL
  Filled 2023-04-25 (×7): qty 2

## 2023-04-25 NOTE — Plan of Care (Signed)
  Problem: Education: Goal: Knowledge of disease or condition will improve Outcome: Progressing   Problem: Activity: Goal: Risk for activity intolerance will decrease Outcome: Progressing   Problem: Cardiac: Goal: Will achieve and/or maintain hemodynamic stability Outcome: Progressing   Problem: Clinical Measurements: Goal: Postoperative complications will be avoided or minimized Outcome: Progressing   Problem: Pain Management: Goal: Pain level will decrease Outcome: Progressing   Problem: Skin Integrity: Goal: Wound healing without signs and symptoms infection will improve Outcome: Progressing   Problem: Education: Goal: Knowledge of General Education information will improve Description: Including pain rating scale, medication(s)/side effects and non-pharmacologic comfort measures Outcome: Progressing   Problem: Clinical Measurements: Goal: Will remain free from infection Outcome: Progressing Goal: Diagnostic test results will improve Outcome: Progressing Goal: Respiratory complications will improve Outcome: Progressing   Problem: Pain Managment: Goal: General experience of comfort will improve Outcome: Progressing

## 2023-04-25 NOTE — Discharge Instructions (Signed)
[facility] [id number]   Robot-Assisted Thoracic Surgery, Care After The following information offers guidance on how to care for yourself after your procedure. Your health care provider may also give you more specific instructions. If you have problems or questions, contact your health care provider. What can I expect after the procedure? After the procedure, it is common to have: Some pain and aches in the area of your surgical incisions. Pain when breathing in (inhaling) and coughing. Tiredness (fatigue). Trouble sleeping. Constipation. Follow these instructions at home: Medicines Take over-the-counter and prescription medicines only as told by your health care provider. If you were prescribed an antibiotic medicine, take it as told by your health care provider. Do not stop taking the antibiotic even if you start to feel better. Talk with your health care provider about safe and effective ways to manage pain after your procedure. Pain management should fit your specific health needs. Take pain medicine before pain becomes severe. Relieving and controlling your pain will make breathing easier for you. Ask your health care provider if the medicine prescribed to you requires you to avoid driving or using machinery. Eating and drinking Follow instructions from your health care provider about eating or drinking restrictions. These will vary depending on what procedure you had. Your health care provider may recommend: A liquid diet or soft diet for the first few days. Meals that are smaller and more frequent. A diet of fruits, vegetables, whole grains, and low-fat proteins. Limiting foods that are high in fat and processed sugar, including fried or sweet foods. Incision care Follow instructions from your health care provider about how to take care of your incisions. Make sure you: Wash your hands with soap and water for at least 20 seconds before and after you change  your bandage (dressing). If soap and water are not available, use hand sanitizer. Change your dressing as told by your health care provider. Leave stitches (sutures), skin glue, or adhesive strips in place. These skin closures may need to stay in place for 2 weeks or longer. If adhesive strip edges start to loosen and curl up, you may trim the loose edges. Do not remove adhesive strips completely unless your health care provider tells you to do that. Check your incision area every day for signs of infection. Check for: Redness, swelling, or more pain. Fluid or blood. Warmth. Pus or a bad smell. Activity Return to your normal activities as told by your health care provider. Ask your health care provider what activities are safe for you. Ask your health care provider when it is safe for you to drive. Do not lift anything that is heavier than 10 lb (4.5 kg), or the limit that you are told, until your health care provider says that it is safe. Rest as told by your health care provider. Avoid sitting for a long time without moving. Get up to take short walks every 1-2 hours. This is important to improve blood flow and breathing. Ask for help if you feel weak or unsteady. Do exercises as told by your health care provider. Pneumonia prevention  Do deep breathing exercises and cough regularly as directed. This helps clear mucus and opens your lungs. Doing this helps prevent lung infection (pneumonia). If you were given an incentive spirometer, use it as told. An incentive spirometer is a tool that measures how well you are filling your lungs with each breath. Coughing may hurt less if you try to support your chest. This is  called splinting. Try one of these when you cough: Hold a pillow against your chest. Place the palms of both hands on top of your incision area. Do not use any products that contain nicotine or tobacco. These products include cigarettes, chewing tobacco, and vaping devices, such as  e-cigarettes. If you need help quitting, ask your health care provider. Avoid secondhand smoke. General instructions If you have a drainage tube: Follow instructions from your health care provider about how to take care of it. Do not travel by airplane after your tube is removed until your health care provider tells you it is safe. You may need to take these actions to prevent or treat constipation: Drink enough fluid to keep your urine pale yellow. Take over-the-counter or prescription medicines. Eat foods that are high in fiber, such as beans, whole grains, and fresh fruits and vegetables. Limit foods that are high in fat and processed sugars, such as fried or sweet foods. Keep all follow-up visits. This is important. Contact a health care provider if: You have redness, swelling, or more pain around an incision. You have fluid or blood coming from an incision. An incision feels warm to the touch. You have pus or a bad smell coming from an incision. You have a fever. You cannot eat or drink without vomiting. Your pain medicine is not controlling your pain. Get help right away if: You have chest pain. Your heart is beating quickly. You have trouble breathing. You have trouble speaking. You are confused. You feel weak or dizzy, or you faint. These symptoms may represent a serious problem that is an emergency. Do not wait to see if the symptoms will go away. Get medical help right away. Call your local emergency services (911 in the [place] Do not drive yourself to the hospital. Summary Talk with your health care provider about safe and effective ways to manage pain after your procedure. Pain management should fit your specific health needs. Return to your normal activities as told by your health care provider. Ask your health care provider what activities are safe for you. Do deep breathing exercises and cough regularly as directed. This helps to clear mucus and prevent pneumonia. If it  hurts to cough, ease pain by holding a pillow against your chest or by placing the palms of both hands over your incisions. This information is not intended to replace advice given to you by your health care provider. Make sure you discuss any questions you have with your health care provider. Document Revised: 04/12/2020 Document Reviewed: 04/13/2020 Elsevier Patient Education  2024 [employer].  Information on my medicine - ELIQUIS (apixaban)  Why was Eliquis prescribed for you? Eliquis was prescribed for you to reduce the risk of a blood clot forming that can cause a stroke if you have a medical condition called atrial fibrillation (a type of irregular heartbeat).  What do You need to know about Eliquis ? Take your Eliquis TWICE DAILY - one tablet in the morning and one tablet in the evening with or without food. If you have difficulty swallowing the tablet whole please discuss with your pharmacist how to take the medication safely.  Take Eliquis exactly as prescribed by your doctor and DO NOT stop taking Eliquis without talking to the doctor who prescribed the medication.  Stopping may increase your risk of developing a stroke.  Refill your prescription before you run out.  After discharge, you should have regular check-up appointments with your healthcare provider that is prescribing your  Eliquis.  In the future your dose may need to be changed if your kidney function or weight changes by a significant amount or as you get older.  What do you do if you miss a dose? If you miss a dose, take it as soon as you remember on the same day and resume taking twice daily.  Do not take more than one dose of ELIQUIS at the same time to make up a missed dose.  Important Safety Information A possible side effect of Eliquis is bleeding. You should call your healthcare provider right away if you experience any of the following: Bleeding from an injury or your nose that does not stop. Unusual  colored urine (red or dark brown) or unusual colored stools (red or black). Unusual bruising for unknown reasons. A serious fall or if you hit your head (even if there is no bleeding).  Some medicines may interact with Eliquis and might increase your risk of bleeding or clotting while on Eliquis. To help avoid this, consult your healthcare provider or pharmacist prior to using any new prescription or non-prescription medications, including herbals, vitamins, non-steroidal anti-inflammatory drugs (NSAIDs) and supplements.  This website has more information on Eliquis (apixaban): http://www.eliquis.com/eliquis/home

## 2023-04-25 NOTE — Progress Notes (Signed)
   04/25/23 0609  Assess: MEWS Score  Temp 98.1 F (36.7 C)  BP 137/72  MAP (mmHg) 89  Pulse Rate (!) 123  ECG Heart Rate (!) 117  Resp 18  Level of Consciousness Alert  SpO2 (!) 89 %  O2 Device Nasal Cannula  Patient Activity (if Appropriate) In bed  O2 Flow Rate (L/min) 2 L/min  Assess: MEWS Score  MEWS Temp 0  MEWS Systolic 0  MEWS Pulse 2  MEWS RR 0  MEWS LOC 0  MEWS Score 2  MEWS Score Color Yellow  Assess: if the MEWS score is Yellow or Red  Were vital signs accurate and taken at a resting state? Yes  Does the patient meet 2 or more of the SIRS criteria? No  MEWS guidelines implemented  Yes, yellow  Treat  MEWS Interventions Considered administering scheduled or prn medications/treatments as ordered  Take Vital Signs  Increase Vital Sign Frequency  Yellow: Q2hr x1, continue Q4hrs until patient remains green for 12hrs  Escalate  MEWS: Escalate Yellow: Discuss with charge nurse and consider notifying provider and/or RRT  Notify: Charge Nurse/RN  Name of Charge Nurse/RN Notified Larita Fife, RN  Provider Notification  Provider Name/Title Dr. Laneta Simmers  Date Provider Notified 04/25/23  Time Provider Notified 0600  Method of Notification Page  Notification Reason New onset of dysrhythmia  Provider response See new orders  Date of Provider Response 04/25/23  Time of Provider Response 0600  Assess: SIRS CRITERIA  SIRS Temperature  0  SIRS Pulse 1  SIRS Respirations  0  SIRS WBC 0  SIRS Score Sum  1

## 2023-04-25 NOTE — Progress Notes (Addendum)
      [address]       [place] [address]             [phone]       2 Days Post-Op Procedure(s) (LRB): XI ROBOTIC ASSISTED THORACOSCOPY-LEFT UPPER LOBE WEDGE RESECTION AND LEFT UPPER LOBECTOMY (Left) VIDEO BRONCHOSCOPY WITH ENDOBRONCHIAL ULTRASOUND (N/A) INTERCOSTAL NERVE BLOCK LYMPH NODE BIOPSY (Left)  Subjective: Patient states "IV beeping". She is coughing but has trouble with expectorating.  Objective: Vital signs in last 24 hours: Temp:  [97.5 F (36.4 C)-98.7 F (37.1 C)] 98.4 F (36.9 C) (09/21 0817) Pulse Rate:  [66-134] 70 (09/21 0817) Cardiac Rhythm: Normal sinus rhythm (09/21 0700) Resp:  [14-18] 14 (09/21 0817) BP: (114-151)/(63-89) 123/73 (09/21 0817) SpO2:  [89 %-96 %] 96 % (09/21 0817)     Intake/Output from previous day: 09/20 0701 - 09/21 0700 In: 1567.4 [I.V.:1567.4] Out: 190 [Chest Tube:190]   Physical Exam:  Cardiovascular: RRR Pulmonary: Clear to auscultation on the right and coarse on the left Abdomen: Soft, non tender, bowel sounds present. Extremities: SCDs in place Wounds: Clean and dry.  No erythema or signs of infection. Chest Tube: to water seal, tidling but no air leak with cough  Lab Results: CBC: Recent Labs    04/24/23 0848 04/25/23 0236  WBC 11.3* 10.5  HGB 11.4* 11.0*  HCT 37.1 35.9*  PLT 187 188   BMET:  Recent Labs    04/24/23 0848 04/25/23 0236  NA 137 138  K 4.3 4.0  CL 104 107  CO2 25 26  GLUCOSE 154* 123*  BUN 22 18  CREATININE 1.26* 1.29*  CALCIUM 8.9 8.5*    PT/INR: No results for input(s): "LABPROT", "INR" in the last 72 hours. ABG:  INR: Will add last result for INR, ABG once components are confirmed Will add last 4 CBG results once components are confirmed  Assessment/Plan:  1. CV - She went into a fib earlier this am. SR at the time of my exam. On Amiodarone drip and Plavix for PAD. Will transition to oral Amiodarone later today. 2.  Pulmonary - On 2 liters of oxygen  via [place]. Wean as able. Chest tube this am with 190 cc last 24 hours. Chest tube is to water seal, tidling but no air leak with cough CXR this am appears to show small, stable left pneumothorax. Hope to remove chest tube. Mucinex 600 mg bid. Encourage incentive spirometer. Await final pathology result.  3. History of CKD (stage III)-Creatinine mildly elevated at 1.29. Appears baseline around 1.2 4. Expected post op blood loss anemia-H and H this am stable at 11 and 35.9 5. On Lovenox for DVT prophylaxis 6. If chest tube removed today, likely home in am if CXR remains stable and maintains SR   Donielle M ZimmermanPA-C 04/25/2023,9:01 AM   Chart reviewed, patient examined, agree with above. CXR looks ok but I see an air leak now so will leave to water seal.

## 2023-04-26 ENCOUNTER — Inpatient Hospital Stay (HOSPITAL_COMMUNITY): Payer: Medicare HMO

## 2023-04-26 LAB — BASIC METABOLIC PANEL
Anion gap: 8 (ref 5–15)
BUN: 15 mg/dL (ref 8–23)
CO2: 25 mmol/L (ref 22–32)
Calcium: 9 mg/dL (ref 8.9–10.3)
Chloride: 104 mmol/L (ref 98–111)
Creatinine, Ser: 1.14 mg/dL — ABNORMAL HIGH (ref 0.44–1.00)
GFR, Estimated: 50 mL/min — ABNORMAL LOW (ref >60)
Glucose, Bld: 112 mg/dL — ABNORMAL HIGH (ref 70–99)
Potassium: 4.2 mmol/L (ref 3.5–5.1)
Sodium: 137 mmol/L (ref 135–145)

## 2023-04-26 MED ORDER — METOCLOPRAMIDE HCL 5 MG/ML IJ SOLN
10.0000 mg | Freq: Four times a day (QID) | INTRAMUSCULAR | Status: AC
  Administered 2023-04-26 – 2023-04-28 (×7): 10 mg via INTRAVENOUS
  Filled 2023-04-26 (×7): qty 2

## 2023-04-26 NOTE — Progress Notes (Addendum)
      [address]       [place] [address]             [phone]       3 Days Post-Op Procedure(s) (LRB): XI ROBOTIC ASSISTED THORACOSCOPY-LEFT UPPER LOBE WEDGE RESECTION AND LEFT UPPER LOBECTOMY (Left) VIDEO BRONCHOSCOPY WITH ENDOBRONCHIAL ULTRASOUND (N/A) INTERCOSTAL NERVE BLOCK LYMPH NODE BIOPSY (Left)  Subjective: Patient sitting in chair. She has nausea this am (no abdominal pain or vomiting).  Objective: Vital signs in last 24 hours: Temp:  [97.7 F (36.5 C)-98.4 F (36.9 C)] 98.4 F (36.9 C) (09/22 0752) Pulse Rate:  [62-70] 62 (09/22 0230) Cardiac Rhythm: Normal sinus rhythm (09/22 0700) Resp:  [11-20] 14 (09/22 0230) BP: (123-145)/(65-74) 123/65 (09/22 0752) SpO2:  [93 %-98 %] 98 % (09/22 0230)     Intake/Output from previous day: 09/21 0701 - 09/22 0700 In: 431.7 [P.O.:120; I.V.:311.7] Out: 220 [Chest Tube:220]   Physical Exam:  Cardiovascular: RRR Pulmonary: Clear to auscultation on the right and coarse on the left Abdomen: Soft, non tender, bowel sounds present. Extremities: SCDs in place Wounds: Clean and dry.  No erythema or signs of infection. Chest Tube: to water seal, very small, very intermittent air leak  Lab Results: CBC: Recent Labs    04/24/23 0848 04/25/23 0236  WBC 11.3* 10.5  HGB 11.4* 11.0*  HCT 37.1 35.9*  PLT 187 188   BMET:  Recent Labs    04/25/23 0236 04/26/23 0234  NA 138 137  K 4.0 4.2  CL 107 104  CO2 26 25  GLUCOSE 123* 112*  BUN 18 15  CREATININE 1.29* 1.14*  CALCIUM 8.5* 9.0    PT/INR: No results for input(s): "LABPROT", "INR" in the last 72 hours. ABG:  INR: Will add last result for INR, ABG once components are confirmed Will add last 4 CBG results once components are confirmed  Assessment/Plan:  1. CV - She went into a fib early 09/21. Maintaining SR. On Amiodarone 400 mg bid and Plavix for PAD. Will decrease Amiodarone to 200 mg bid. 2.  Pulmonary - On room air this am. Chest  tube this am with 220 cc last 24 hours. Chest tube is to water seal, very small, very intermittent air leak. CXR this am appears to show small, stable left pneumothorax. Mucinex 600 mg bid. Encourage incentive spirometer. Await final pathology result.  3. History of CKD (stage III)-Creatinine mildly elevated at 1.14. Appears baseline around 1.2 4. Expected post op blood loss anemia-H and H this am stable at 11 and 35.9 5. On Lovenox for DVT prophylaxis 6. GI-nausea. Will order scheduled Reglan, continue Zofran PRN. Amiodarone decreased but may need to stop if nausea continues. 7. Must ambulate  Donielle M ZimmermanPA-C 04/26/2023,8:47 AM   Chart reviewed, patient examined, agree with above.  CXR with tiny left basilar ptx, small intermittent air leak with breathing, 3 chambers with coughing. Will continue to water seal.

## 2023-04-26 NOTE — Plan of Care (Signed)

## 2023-04-26 NOTE — Anesthesia Postprocedure Evaluation (Signed)
Anesthesia Post Note  Patient: DALANI MOHL  Procedure(s) Performed: XI ROBOTIC ASSISTED THORACOSCOPY-LEFT UPPER LOBE WEDGE RESECTION AND LEFT UPPER LOBECTOMY (Left: Chest) VIDEO BRONCHOSCOPY WITH ENDOBRONCHIAL ULTRASOUND INTERCOSTAL NERVE BLOCK (Chest) LYMPH NODE BIOPSY (Left: Chest)     Patient location during evaluation: PACU Anesthesia Type: General Level of consciousness: awake and alert Pain management: pain level controlled Vital Signs Assessment: post-procedure vital signs reviewed and stable Respiratory status: spontaneous breathing, nonlabored ventilation, respiratory function stable and patient connected to nasal cannula oxygen Cardiovascular status: blood pressure returned to baseline and stable Postop Assessment: no apparent nausea or vomiting Anesthetic complications: no   No notable events documented.  Last Vitals:  Vitals:   04/26/23 0230 04/26/23 0752  BP: (!) 145/74 123/65  Pulse: 62   Resp: 14   Temp: 36.5 C 36.9 C  SpO2: 98%     Last Pain:  Vitals:   04/26/23 1017  TempSrc:   PainSc: 0-No pain                 Deyanna Mctier S

## 2023-04-27 ENCOUNTER — Inpatient Hospital Stay (HOSPITAL_COMMUNITY): Payer: Medicare HMO

## 2023-04-27 LAB — CYTOLOGY - NON PAP

## 2023-04-27 LAB — SURGICAL PATHOLOGY

## 2023-04-27 NOTE — Plan of Care (Signed)

## 2023-04-27 NOTE — Progress Notes (Addendum)
4 Days Post-Op Procedure(s) (LRB): XI ROBOTIC ASSISTED THORACOSCOPY-LEFT UPPER LOBE WEDGE RESECTION AND LEFT UPPER LOBECTOMY (Left) VIDEO BRONCHOSCOPY WITH ENDOBRONCHIAL ULTRASOUND (N/A) INTERCOSTAL NERVE BLOCK LYMPH NODE BIOPSY (Left) Subjective: Some nausea persists, cont reglan and protonix  Objective: Vital signs in last 24 hours: Temp:  [97.6 F (36.4 C)-98.8 F (37.1 C)] 98.3 F (36.8 C) (09/23 0500) Pulse Rate:  [65-73] 73 (09/23 0500) Cardiac Rhythm: Normal sinus rhythm (09/22 1952) Resp:  [10-16] 12 (09/23 0500) BP: (111-138)/(63-68) 138/68 (09/23 0500) SpO2:  [91 %-93 %] 92 % (09/23 0500)  Hemodynamic parameters for last 24 hours:    Intake/Output from previous day: 09/22 0701 - 09/23 0700 In: 240 [P.O.:240] Out: 225 [Chest Tube:225] Intake/Output this shift: No intake/output data recorded.  General appearance: alert, cooperative, and no distress Heart: regular rate and rhythm Lungs: clear to auscultation bilaterally Abdomen: benign Extremities: no edema or calf tenderness Wound: incis healing well  Lab Results: Recent Labs    04/24/23 0848 04/25/23 0236  WBC 11.3* 10.5  HGB 11.4* 11.0*  HCT 37.1 35.9*  PLT 187 188   BMET:  Recent Labs    04/25/23 0236 04/26/23 0234  NA 138 137  K 4.0 4.2  CL 107 104  CO2 26 25  GLUCOSE 123* 112*  BUN 18 15  CREATININE 1.29* 1.14*  CALCIUM 8.5* 9.0    PT/INR: No results for input(s): "LABPROT", "INR" in the last 72 hours. ABG    Component Value Date/Time   PHART 7.254 (L) 04/23/2023 1555   HCO3 24.0 04/23/2023 1555   TCO2 26 04/23/2023 1555   ACIDBASEDEF 4.0 (H) 04/23/2023 1555   O2SAT 98 04/23/2023 1555   CBG (last 3)  No results for input(s): "GLUCAP" in the last 72 hours.  Meds Scheduled Meds:  acetaminophen  1,000 mg Oral Q6H   Or   acetaminophen (TYLENOL) oral liquid 160 mg/5 mL  1,000 mg Oral Q6H   amiodarone  400 mg Oral BID   bisacodyl  10 mg Oral Daily   clopidogrel  75 mg Oral Q  breakfast   enoxaparin (LOVENOX) injection  40 mg Subcutaneous Daily   gabapentin  300 mg Oral BID   guaiFENesin  600 mg Oral BID   loratadine  10 mg Oral Daily   metoCLOPramide (REGLAN) injection  10 mg Intravenous Q6H   pantoprazole  40 mg Oral Daily   rosuvastatin  40 mg Oral QHS   senna-docusate  1 tablet Oral QHS   Continuous Infusions:  amiodarone Stopped (04/25/23 1456)   dextrose 5 % and 0.45 % NaCl Stopped (04/25/23 0437)   PRN Meds:.ipratropium-albuterol, morphine injection, ondansetron (ZOFRAN) IV, oxyCODONE, traMADol  Xrays DG CHEST PORT 1 VIEW  Result Date: 04/26/2023 CLINICAL DATA:  77 year old female with history of pneumothorax. EXAM: PORTABLE CHEST 1 VIEW COMPARISON:  Chest x-ray 04/25/2023. FINDINGS: Postoperative changes of left upper lobectomy are noted. Left-sided chest tube in position with tip extending into the apex of the left hemithorax. Trace pneumothorax most evident in the medial aspect of the left base. Small amount of subcutaneous emphysema along the left chest wall incidentally noted. Opacity at the left base favored to reflect postoperative subsegmental atelectasis. Right lung appears clear. No pleural effusions. No evidence of pulmonary edema. Heart size is normal. Upper mediastinal contours are within normal limits. Atherosclerotic calcifications in the thoracic aorta. IMPRESSION: 1. Postoperative changes and support apparatus, as above. Stable position of left-sided chest tube with trace left basilar pneumothorax. 2. Aortic atherosclerosis. Electronically Signed  By: Trudie Reed M.D.   On: 04/26/2023 08:43    Assessment/Plan: S/P Procedure(s) (LRB): XI ROBOTIC ASSISTED THORACOSCOPY-LEFT UPPER LOBE WEDGE RESECTION AND LEFT UPPER LOBECTOMY (Left) VIDEO BRONCHOSCOPY WITH ENDOBRONCHIAL ULTRASOUND (N/A) INTERCOSTAL NERVE BLOCK LYMPH NODE BIOPSY (Left) POD#4  1 afeb, VSS, sinus rhythm 2 O2 sats ok on RA 3 multiple voids, not measured 4 CT 225 ml/24  h, no air leak- will d/c tube 5 CXR poss trace apical left pntx 6 no new labs 7 lovenox for dvt ppx 8 cont pulm hygiene and rehab modalities I think if she has a good day and no new rhythm or issues related to CT removal , likely will be ready for d/c tomorrow    LOS: 4 days    Rowe Clack PA-C Pager [phone] 04/27/2023 Patient seen and examined, agree with findings and plan outlined above  Viviann Spare C. Dorris Fetch, MD [facility] [phone]

## 2023-04-27 NOTE — Plan of Care (Signed)
  Problem: Education: Goal: Knowledge of disease or condition will improve Outcome: Progressing Goal: Knowledge of the prescribed therapeutic regimen will improve Outcome: Progressing   Problem: Activity: Goal: Risk for activity intolerance will decrease Outcome: Progressing   Problem: Cardiac: Goal: Will achieve and/or maintain hemodynamic stability Outcome: Progressing   Problem: Clinical Measurements: Goal: Postoperative complications will be avoided or minimized Outcome: Progressing   Problem: Respiratory: Goal: Respiratory status will improve Outcome: Progressing   Problem: Pain Management: Goal: Pain level will decrease Outcome: Progressing   Problem: Education: Goal: Knowledge of General Education information will improve Description: Including pain rating scale, medication(s)/side effects and non-pharmacologic comfort measures Outcome: Progressing   Problem: Health Behavior/Discharge Planning: Goal: Ability to manage health-related needs will improve Outcome: Progressing   Problem: Clinical Measurements: Goal: Will remain free from infection Outcome: Progressing   Problem: Nutrition: Goal: Adequate nutrition will be maintained Outcome: Progressing   Problem: Coping: Goal: Level of anxiety will decrease Outcome: Progressing   Problem: Elimination: Goal: Will not experience complications related to urinary retention Outcome: Progressing   Problem: Pain Managment: Goal: General experience of comfort will improve Outcome: Progressing   Problem: Safety: Goal: Ability to remain free from injury will improve Outcome: Progressing   Problem: Skin Integrity: Goal: Risk for impaired skin integrity will decrease Outcome: Progressing

## 2023-04-27 NOTE — Care Management Important Message (Signed)
Important Message  Patient Details  Name: Courtney Gill MRN: [id number] Date of Birth: Mar 23, 1946   Medicare Important Message Given:  Yes     Yakelin Grenier Stefan Church 04/27/2023, 3:33 PM

## 2023-04-27 NOTE — Progress Notes (Signed)
Mobility Specialist Progress Note:   04/27/23 1000  Mobility  Activity Ambulated with assistance in hallway  Level of Assistance Contact guard assist, steadying assist  Assistive Device Other (Comment) (HHA)  Distance Ambulated (ft) 340 ft  Activity Response Tolerated well  Mobility Referral Yes  $Mobility charge 1 Mobility  Mobility Specialist Start Time (ACUTE ONLY) [phone]  Mobility Specialist Stop Time (ACUTE ONLY) 1007  Mobility Specialist Time Calculation (min) (ACUTE ONLY) 9 min    Pre Mobility: 66 HR,  94% SpO2 During Mobility: 80 HR,  94% SpO2 Post Mobility:  78 HR,  97% SpO2  Received pt in chair having no complaints and agreeable to mobility. Pt was asymptomatic throughout ambulation and returned to room w/o fault. Voided in BR upon returning to room. Left in chair w/ call bell in reach and all needs met.   D'Vante Earlene Plater Mobility Specialist Please contact via Special educational needs teacher or Rehab office at [phone]

## 2023-04-28 ENCOUNTER — Other Ambulatory Visit (HOSPITAL_COMMUNITY): Payer: Self-pay

## 2023-04-28 ENCOUNTER — Inpatient Hospital Stay (HOSPITAL_COMMUNITY): Payer: Medicare HMO

## 2023-04-28 DIAGNOSIS — I4819 Other persistent atrial fibrillation: Secondary | ICD-10-CM

## 2023-04-28 MED ORDER — LACTULOSE 10 GM/15ML PO SOLN
30.0000 g | Freq: Every day | ORAL | Status: DC | PRN
  Administered 2023-04-28: 30 g via ORAL
  Filled 2023-04-28: qty 45

## 2023-04-28 MED ORDER — METOPROLOL TARTRATE 25 MG PO TABS: 25.0000 mg | ORAL_TABLET | Freq: Two times a day (BID) | ORAL | 1 refills | Status: DC

## 2023-04-28 MED ORDER — METOPROLOL TARTRATE 25 MG PO TABS
25.0000 mg | ORAL_TABLET | Freq: Two times a day (BID) | ORAL | Status: DC
  Administered 2023-04-28: 25 mg via ORAL
  Filled 2023-04-28: qty 1

## 2023-04-28 MED ORDER — AMIODARONE HCL 200 MG PO TABS: 400.0000 mg | ORAL_TABLET | Freq: Two times a day (BID) | ORAL | 1 refills | Status: DC

## 2023-04-28 MED ORDER — GUAIFENESIN ER 600 MG PO TB12: 600.0000 mg | ORAL_TABLET | Freq: Two times a day (BID) | ORAL | 0 refills | Status: DC

## 2023-04-28 MED ORDER — APIXABAN 5 MG PO TABS: 5.0000 mg | ORAL_TABLET | Freq: Two times a day (BID) | ORAL | 1 refills | Status: DC

## 2023-04-28 MED ORDER — APIXABAN 5 MG PO TABS
5.0000 mg | ORAL_TABLET | Freq: Two times a day (BID) | ORAL | Status: DC
  Administered 2023-04-28: 5 mg via ORAL
  Filled 2023-04-28: qty 1

## 2023-04-28 MED ORDER — TRAMADOL HCL 50 MG PO TABS: 50.0000 mg | ORAL_TABLET | Freq: Four times a day (QID) | ORAL | 0 refills | Status: AC | PRN

## 2023-04-28 MED ORDER — GABAPENTIN 300 MG PO CAPS: 300.0000 mg | ORAL_CAPSULE | Freq: Two times a day (BID) | ORAL | 0 refills | Status: DC

## 2023-04-28 NOTE — TOC Benefit Eligibility Note (Signed)
Patient Product/process development scientist completed.    The patient is insured through [employer]. Patient has Medicare and is not eligible for a copay card, but may be able to apply for patient assistance, if available.    Ran test claim for Eliquis 5 mg and the current 30 day co-pay is $146.15.   This test claim was processed through [facility]- copay amounts may vary at other pharmacies due to pharmacy/plan contracts, or as the patient moves through the different stages of their insurance plan.     Roland Earl, CPHT Pharmacy Technician III Certified Patient Advocate [facility] Patient Advocate Team Direct Number: [phone]  Fax: [phone]

## 2023-04-28 NOTE — Progress Notes (Signed)
Went over discharge paper work with patient and husband. All questions answered. PIV and telemetry removed. All belongings at bedside.

## 2023-04-28 NOTE — Progress Notes (Signed)
Patient converted to Afib rate controlled 80-90's.  VSS.  NAD.  EKG completed, confirmed Afib.  Pt remains on Amiodarone 400 mg BID.  No doses missed.

## 2023-04-28 NOTE — Consult Note (Signed)
Cardiology Consultation   Patient ID: Courtney Gill MRN: [id number]; DOB: 1946-05-02  Admit date: 04/23/2023 Date of Consult: 04/28/2023  PCP:  Benita Stabile, MD   [facility] Providers Cardiologist:  None   {   Patient Profile:   Courtney Gill is a 77 y.o. female with a hx of PAD, carotid stent, moderate stenosis, stage III CKD, nicotine dependence, COPD, angioedema on aspirin now on Plavix, alpha gal, who is being seen 04/28/2023 for the evaluation of atrial fibrillation at the request of Dr. Dorris Fetch.  History of Present Illness:   Courtney Gill has no prior cardiac history other than valvular disease noted above.  Appears to be stable, mean gradient 21.5.  VTI measures 1.24. 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  Patient was found to have a upper left lung nodule on routine CT screening for lung cancer.  She had an abnormal PET/CT scan that showed hypermetabolic activity.  She is doing well postoperatively however on 04/25/2023 I developed postoperative atrial fibrillation with RVR and started on IV amiodarone.  She later converted to normal sinus rhythm and this was transitioned to PO same day.  Several days had gone by and now yesterday she went back in atrial fibrillation with controlled rates. Patient asymptomatic without any significant cardiac complaints. Slight nausea. She denies any chest pain, shortness of breath, peripheral edema.  Functionally does well at home with her husband and.  She is improving smoker for several years however she states that she is now quit since being here.  She also has history of angioedema with unknown trigger however thought related to alpha gal and possibly aspirin.  She takes Plavix for PAD.  Dr. Lanna Poche to decide whether or not to continue outpatient.  She is followed by an allergist.   Past Medical History:  Diagnosis Date   Angioedema 02/2013    Arthritis    Dyspnea    with exertion   GERD (gastroesophageal reflux disease)    Hypercholesterolemia    Hypertension    pt denies this (04/21/23)   Left upper lobe pulmonary nodule    Migraine    Moderate aortic stenosis    Mean gradient 21.5 mmHg, AVA 1.24 cm by echo 03/2023   Peripheral artery disease [facility])     Past Surgical History:  Procedure Laterality Date   CAROTID STENT Right 08/04/2005   DILATION AND CURETTAGE OF UTERUS     EYE SURGERY     "muscle moved"   INTERCOSTAL NERVE BLOCK  04/23/2023   Procedure: INTERCOSTAL NERVE BLOCK;  Surgeon: Loreli Slot, MD;  Location: [facility] OR;  Service: Thoracic;;   LYMPH NODE BIOPSY Left 04/23/2023   Procedure: LYMPH NODE BIOPSY;  Surgeon: Loreli Slot, MD;  Location: [facility] OR;  Service: Thoracic;  Laterality: Left;   TONSILLECTOMY     in early 20's   VIDEO BRONCHOSCOPY WITH ENDOBRONCHIAL ULTRASOUND N/A 04/23/2023   Procedure: VIDEO BRONCHOSCOPY WITH ENDOBRONCHIAL ULTRASOUND;  Surgeon: Loreli Slot, MD;  Location: [facility] OR;  Service: Thoracic;  Laterality: N/A;     Inpatient Medications: Scheduled Meds:  acetaminophen  1,000 mg Oral Q6H   Or   acetaminophen (TYLENOL) oral liquid 160 mg/5 mL  1,000 mg Oral Q6H   amiodarone  400 mg Oral BID   apixaban  5 mg Oral BID   bisacodyl  10 mg Oral Daily   clopidogrel  75  mg Oral Q breakfast   gabapentin  300 mg Oral BID   guaiFENesin  600 mg Oral BID   loratadine  10 mg Oral Daily   metoCLOPramide (REGLAN) injection  10 mg Intravenous Q6H   pantoprazole  40 mg Oral Daily   rosuvastatin  40 mg Oral QHS   senna-docusate  1 tablet Oral QHS   Continuous Infusions:  dextrose 5 % and 0.45 % NaCl Stopped (04/25/23 0437)   PRN Meds: ipratropium-albuterol, lactulose, morphine injection, ondansetron (ZOFRAN) IV, oxyCODONE, traMADol  Allergies:    Allergies  Allergen Reactions   Bee Venom Anaphylaxis   Wasp Venom Anaphylaxis   Aspirin Swelling    Social History:    Social History   Socioeconomic History   Marital status: Married    Spouse name: Not on file   Number of children: 1   Years of education: Not on file   Highest education level: Not on file  Occupational History   Not on file  Tobacco Use   Smoking status: Every Day    Current packs/day: 1.00    Types: Cigarettes   Smokeless tobacco: Never  Vaping Use   Vaping status: Never Used  Substance and Sexual Activity   Alcohol use: Not Currently   Drug use: No   Sexual activity: Not on file  Other Topics Concern   Not on file  Social History Narrative   Lives home with husband, drinks caffeine 2 cups daily.  Education HS.  Children 1 son, 4 stepchildren.   Social Determinants of Health   Financial Resource Strain: Not on file  Food Insecurity: No Food Insecurity (04/23/2023)   Hunger Vital Sign    Worried About Running Out of Food in the Last Year: Never true    Ran Out of Food in the Last Year: Never true  Transportation Needs: No Transportation Needs (04/23/2023)   PRAPARE - Administrator, Civil Service (Medical): No    Lack of Transportation (Non-Medical): No  Physical Activity: Not on file  Stress: Not on file  Social Connections: Not on file  Intimate Partner Violence: Not At Risk (04/23/2023)   Humiliation, Afraid, Rape, and Kick questionnaire    Fear of Current or Ex-Partner: No    Emotionally Abused: No    Physically Abused: No    Sexually Abused: No    Family History:   Family History  Problem Relation Age of Onset   Atrial fibrillation Mother    Diabetes Maternal Grandfather    Cancer Paternal Grandfather      ROS:  Please see the history of present illness.  All other ROS reviewed and negative.     Physical Exam/Data:   Vitals:   04/27/23 2335 04/27/23 2336 04/28/23 0421 04/28/23 0752  BP:  136/73 118/64 113/66  Pulse:  97 95 88  Resp:  16 17 10   Temp: 98.4 F (36.9 C) 98.4 F (36.9 C) 97.7 F (36.5 C) 98.6 F (37 C)  TempSrc:  Oral  Oral Oral  SpO2:  94% 90% (!) 89%  Weight:      Height:        Intake/Output Summary (Last 24 hours) at 04/28/2023 1116 Last data filed at 04/28/2023 0934 Gross per 24 hour  Intake 358 ml  Output 300 ml  Net 58 ml      04/23/2023    8:12 PM 04/23/2023   10:22 AM 04/21/2023    1:46 PM  Last 3 Weights  Weight (lbs) 144 lb  2.9 oz 153 lb 154 lb 9.6 oz  Weight (kg) 65.4 kg 69.4 kg 70.126 kg     Body mass index is 25.54 kg/m.  General:  Well nourished, well developed, in no acute distress HEENT: normal Neck: no JVD Vascular: No carotid bruits; Distal pulses 2+ bilaterally Cardiac: Irregularly irregular, positive murmur Lungs: Crackles, diminished  Abd: soft, nontender, no hepatomegaly  Ext: no edema Musculoskeletal:  No deformities, BUE and BLE strength normal and equal Skin: warm and dry  Neuro:  CNs 2-12 intact, no focal abnormalities noted Psych:  Normal affect   EKG:  The EKG was personally reviewed and demonstrates: Atrial fibrillation heart rates in the 90s.  Nonspecific T wave changes. Telemetry:  Telemetry was personally reviewed and demonstrates: Atrial fibrillation heart rate generally in the 80s.  Relevant CV Studies: Echocardiogram 03/19/2023 1. Left ventricular ejection fraction, by estimation, is 65 to 70%. The  left ventricle has normal function. The left ventricle has no regional  wall motion abnormalities. There is moderate left ventricular hypertrophy.  Left ventricular diastolic  parameters are consistent with Grade I diastolic dysfunction (impaired  relaxation). Elevated left atrial pressure.   2. Right ventricular systolic function is normal. The right ventricular  size is normal. There is normal pulmonary artery systolic pressure.   3. Left atrial size was moderately dilated.   4. The mitral valve is abnormal. Trivial mitral valve regurgitation. No  evidence of mitral stenosis. Moderate mitral annular calcification.   5. The tricuspid valve is abnormal.    6. The aortic valve is tricuspid. There is moderate calcification of the  aortic valve. There is moderate thickening of the aortic valve. Aortic  valve regurgitation is mild. Moderate aortic valve stenosis. Aortic valve  mean gradient measures 21.5 mmHg.  Aortic valve peak gradient measures 35.5 mmHg. Aortic valve area, by VTI  measures 1.24 cm.   7. The inferior vena cava is normal in size with greater than 50%  respiratory variability, suggesting right atrial pressure of 3 mmHg.    Laboratory Data:  High Sensitivity Troponin:  No results for input(s): "TROPONINIHS" in the last 720 hours.   Chemistry Recent Labs  Lab 04/24/23 0848 04/25/23 0236 04/26/23 0234  NA 137 138 137  K 4.3 4.0 4.2  CL 104 107 104  CO2 25 26 25   GLUCOSE 154* 123* 112*  BUN 22 18 15   CREATININE 1.26* 1.29* 1.14*  CALCIUM 8.9 8.5* 9.0  GFRNONAA 45* 43* 50*  ANIONGAP 8 5 8     Recent Labs  Lab 04/21/23 1500 04/25/23 0236  PROT 6.4* 5.6*  ALBUMIN 3.6 2.9*  AST 21 20  ALT 17 10  ALKPHOS 56 49  BILITOT 0.9 0.7   Lipids No results for input(s): "CHOL", "TRIG", "HDL", "LABVLDL", "LDLCALC", "CHOLHDL" in the last 168 hours.  Hematology Recent Labs  Lab 04/21/23 1500 04/23/23 1555 04/24/23 0848 04/25/23 0236  WBC 5.8  --  11.3* 10.5  RBC 4.52  --  4.08 3.81*  HGB 12.6 11.9* 11.4* 11.0*  HCT 40.9 35.0* 37.1 35.9*  MCV 90.5  --  90.9 94.2  MCH 27.9  --  27.9 28.9  MCHC 30.8  --  30.7 30.6  RDW 15.5  --  16.0* 16.4*  PLT 209  --  187 188   Thyroid No results for input(s): "TSH", "FREET4" in the last 168 hours.  BNPNo results for input(s): "BNP", "PROBNP" in the last 168 hours.  DDimer No results for input(s): "DDIMER" in the last 168  hours.   Radiology/Studies:  DG Chest 2 View  Result Date: 04/28/2023 CLINICAL DATA:  S/P lobectomy of lung EXAM: CHEST - 2 VIEW COMPARISON:  Chest x-ray 04/27/2023. FINDINGS: Mild streaky left no visible pneumothorax. Small amount of subcutaneous gas along  the left chest wall. Basilar opacities. No visible pleural effusions. Cardiomediastinal silhouette is unchanged. No acute bony abnormality. IMPRESSION: No visible pneumothorax. Small amount of subcutaneous gas along the left chest wall. Mild streaky left basilar opacities, likely atelectasis. Electronically Signed   By: Feliberto Harts M.D.   On: 04/28/2023 09:26   DG CHEST PORT 1 VIEW  Result Date: 04/27/2023 CLINICAL DATA:  Pneumothorax EXAM: PORTABLE CHEST - 1 VIEW COMPARISON:  the previous day's study FINDINGS: Stable left chest tube. No definite pneumothorax. Surgical staples at the left hilum. Stable left lateral subcutaneous emphysema. Coarse perihilar interstitial opacities left greater than right. Heart size and mediastinal contours are within normal limits. Aortic Atherosclerosis (ICD10-170.0). No effusion. IMPRESSION: 1. Stable left chest tube without pneumothorax. 2. Coarse perihilar interstitial opacities left greater than right. Electronically Signed   By: Corlis Leak M.D.   On: 04/27/2023 07:49   DG CHEST PORT 1 VIEW  Result Date: 04/26/2023 CLINICAL DATA:  77 year old female with history of pneumothorax. EXAM: PORTABLE CHEST 1 VIEW COMPARISON:  Chest x-ray 04/25/2023. FINDINGS: Postoperative changes of left upper lobectomy are noted. Left-sided chest tube in position with tip extending into the apex of the left hemithorax. Trace pneumothorax most evident in the medial aspect of the left base. Small amount of subcutaneous emphysema along the left chest wall incidentally noted. Opacity at the left base favored to reflect postoperative subsegmental atelectasis. Right lung appears clear. No pleural effusions. No evidence of pulmonary edema. Heart size is normal. Upper mediastinal contours are within normal limits. Atherosclerotic calcifications in the thoracic aorta. IMPRESSION: 1. Postoperative changes and support apparatus, as above. Stable position of left-sided chest tube with trace left  basilar pneumothorax. 2. Aortic atherosclerosis. Electronically Signed   By: Trudie Reed M.D.   On: 04/26/2023 08:43   DG Chest 1 View  Result Date: 04/25/2023 CLINICAL DATA:  Postop follow-up EXAM: CHEST  1 VIEW COMPARISON:  Yesterday FINDINGS: Small left pneumothorax with chest tube in place, tip at the apex. Chain sutures overlap the left lung. Generalized interstitial coarsening is stable and bronchitic. Retrocardiac atelectasis. Stable heart size and mediastinal contours. IMPRESSION: 1. Left chest tube with small pneumothorax. 2. Mild increase in retrocardiac atelectasis. Electronically Signed   By: Tiburcio Pea M.D.   On: 04/25/2023 08:39     Assessment and Plan:   New onset paroxysmal atrial fibrillation This occurred initially postoperatively 09/21 after left upper lobe wedge resection.  Had converted back to normal sinus rhythm on IV amiodarone due to RVR, transitioned to PO.  Maintained normal sinus rhythm until yesterday night.  Rate controlled in the 80s.  Asymptomatic. Continue amiodarone 400 mg twice daily for 1 week, 200 mg daily there after (start 09/28) Continue Eliquis 5 mg twice daily Normal EF. Stable to DC. Will arrange follow up 3-4 weeks.   Moderate aortic stenosis Mean gradient 21.5.  Valve area 1.24.  2 years ago-gradient 12.  Valve area 2.64.  Asymptomatic, stable disease.  Continue to monitor.  PAD Carotid Stent  Reported angioedema with alpha gal and possibly aspirin.  Maintained on Plavix chronically and followed by Dr. Lanna Poche.    Nicotine dependence Chronic smoker however is determined to quit since this admission.  Status post left upper  lobe wedge resection and left upper lobectomy due to pulmonary nodule   Risk Assessment/Risk Scores:    CHA2DS2-VASc Score = 4   This indicates a 4.8% annual risk of stroke. The patient's score is based upon: CHF History: 0 HTN History: 0 Diabetes History: 0 Stroke History: 0 Vascular Disease History: 1 Age  Score: 2 Gender Score: 1    For questions or updates, please contact  [facility] Please consult [email or web address] for contact info under    Signed, Abagail Kitchens, PA-C  04/28/2023 11:16 AM

## 2023-04-28 NOTE — Progress Notes (Addendum)
5 Days Post-Op Procedure(s) (LRB): XI ROBOTIC ASSISTED THORACOSCOPY-LEFT UPPER LOBE WEDGE RESECTION AND LEFT UPPER LOBECTOMY (Left) VIDEO BRONCHOSCOPY WITH ENDOBRONCHIAL ULTRASOUND (N/A) INTERCOSTAL NERVE BLOCK LYMPH NODE BIOPSY (Left) Subjective: Feels better, nausea improved  Objective: Vital signs in last 24 hours: Temp:  [97.6 F (36.4 C)-98.4 F (36.9 C)] 97.7 F (36.5 C) (09/24 0421) Pulse Rate:  [63-97] 95 (09/24 0421) Cardiac Rhythm: Atrial fibrillation;Atrial flutter (09/24 0700) Resp:  [13-18] 17 (09/24 0421) BP: (107-136)/(64-73) 118/64 (09/24 0421) SpO2:  [90 %-94 %] 90 % (09/24 0421)  Hemodynamic parameters for last 24 hours:    Intake/Output from previous day: 09/23 0701 - 09/24 0700 In: 350 [P.O.:350] Out: 370 [Urine:300; Chest Tube:70] Intake/Output this shift: No intake/output data recorded.  General appearance: alert, cooperative, and no distress Heart: irregularly irregular rhythm Lungs: mild coarseness in the bases Abdomen: benign Extremities: no edema or calf tenderness Wound: incis healing well  Lab Results: No results for input(s): "WBC", "HGB", "HCT", "PLT" in the last 72 hours. BMET:  Recent Labs    04/26/23 0234  NA 137  K 4.2  CL 104  CO2 25  GLUCOSE 112*  BUN 15  CREATININE 1.14*  CALCIUM 9.0    PT/INR: No results for input(s): "LABPROT", "INR" in the last 72 hours. ABG    Component Value Date/Time   PHART 7.254 (L) 04/23/2023 1555   HCO3 24.0 04/23/2023 1555   TCO2 26 04/23/2023 1555   ACIDBASEDEF 4.0 (H) 04/23/2023 1555   O2SAT 98 04/23/2023 1555   CBG (last 3)  No results for input(s): "GLUCAP" in the last 72 hours.  Meds Scheduled Meds:  acetaminophen  1,000 mg Oral Q6H   Or   acetaminophen (TYLENOL) oral liquid 160 mg/5 mL  1,000 mg Oral Q6H   amiodarone  400 mg Oral BID   bisacodyl  10 mg Oral Daily   clopidogrel  75 mg Oral Q breakfast   enoxaparin (LOVENOX) injection  40 mg Subcutaneous Daily   gabapentin   300 mg Oral BID   guaiFENesin  600 mg Oral BID   loratadine  10 mg Oral Daily   metoCLOPramide (REGLAN) injection  10 mg Intravenous Q6H   pantoprazole  40 mg Oral Daily   rosuvastatin  40 mg Oral QHS   senna-docusate  1 tablet Oral QHS   Continuous Infusions:  dextrose 5 % and 0.45 % NaCl Stopped (04/25/23 0437)   PRN Meds:.ipratropium-albuterol, morphine injection, ondansetron (ZOFRAN) IV, oxyCODONE, traMADol  Xrays DG CHEST PORT 1 VIEW  Result Date: 04/27/2023 CLINICAL DATA:  Pneumothorax EXAM: PORTABLE CHEST - 1 VIEW COMPARISON:  the previous day's study FINDINGS: Stable left chest tube. No definite pneumothorax. Surgical staples at the left hilum. Stable left lateral subcutaneous emphysema. Coarse perihilar interstitial opacities left greater than right. Heart size and mediastinal contours are within normal limits. Aortic Atherosclerosis (ICD10-170.0). No effusion. IMPRESSION: 1. Stable left chest tube without pneumothorax. 2. Coarse perihilar interstitial opacities left greater than right. Electronically Signed   By: Corlis Leak M.D.   On: 04/27/2023 07:49    Assessment/Plan: S/P Procedure(s) (LRB): XI ROBOTIC ASSISTED THORACOSCOPY-LEFT UPPER LOBE WEDGE RESECTION AND LEFT UPPER LOBECTOMY (Left) VIDEO BRONCHOSCOPY WITH ENDOBRONCHIAL ULTRASOUND (N/A) INTERCOSTAL NERVE BLOCK LYMPH NODE BIOPSY (Left) POD#5  1 afeb, VSS , afib w/ CVR - is on po amio- will need to decide on ACRX, currently on plavix 2 O2 sats mostly  ok on RA, some sats in 80's 3 UOP- not all recorded- appears adequate 4 no  new labs 5 CXR - no pneumothorax, aeration improved in left lower fields 6 hasn't had a BM - will give some lactulose 7 will d/w MD- cont pulm hygiene and rehab modalities   LOS: 5 days    Courtney Clack PA-C Pager [phone]  04/28/2023 Patient seen and examined, agree with findings and plan outlined above Back in a fib with controlled VR- start Eliquis Will ask Cardiology to see to  arrange follow up Home later today if rate remains controlled  Courtney Spare C. Dorris Fetch, MD [facility] [phone]

## 2023-04-28 NOTE — Progress Notes (Signed)
Mobility Specialist Progress Note:   04/28/23 1114  Mobility  Activity Ambulated with assistance in hallway  Level of Assistance Contact guard assist, steadying assist  Assistive Device None  Distance Ambulated (ft) 300 ft  Activity Response Tolerated well  Mobility Referral Yes  $Mobility charge 1 Mobility  Mobility Specialist Start Time (ACUTE ONLY) 1040  Mobility Specialist Stop Time (ACUTE ONLY) 1051  Mobility Specialist Time Calculation (min) (ACUTE ONLY) 11 min   During Mobility: 120 HR  Post Mobility: 110 HR   Pt received in chair, agreeable to mobility. SB to stand. CG during ambulation. 1x LOB requiring standing rest break. Pt c/o nausea, otherwise asymptomatic throughout. Pt returned to chair with all needs met. MD present in room.  Leory Plowman  Mobility Specialist Please contact via [employer] office at [phone]

## 2023-04-29 LAB — TYPE AND SCREEN
ABO/RH(D): O NEG
Antibody Screen: NEGATIVE
Unit division: 0
Unit division: 0

## 2023-04-29 LAB — BPAM RBC
Blood Product Expiration Date: 202409302359
Blood Product Expiration Date: 202410022359
Unit Type and Rh: 9500
Unit Type and Rh: 9500

## 2023-05-03 ENCOUNTER — Emergency Department (HOSPITAL_COMMUNITY): Payer: Medicare HMO

## 2023-05-03 ENCOUNTER — Encounter (HOSPITAL_COMMUNITY): Payer: Self-pay | Admitting: Emergency Medicine

## 2023-05-03 ENCOUNTER — Other Ambulatory Visit: Payer: Self-pay

## 2023-05-03 ENCOUNTER — Emergency Department (HOSPITAL_COMMUNITY)
Admission: EM | Admit: 2023-05-03 | Discharge: 2023-05-03 | Disposition: A | Discharge status: Home / Self Care | Payer: Medicare HMO | Attending: Emergency Medicine | Admitting: Emergency Medicine

## 2023-05-03 DIAGNOSIS — X58XXXA Exposure to other specified factors, initial encounter: Secondary | ICD-10-CM | POA: Diagnosis not present

## 2023-05-03 DIAGNOSIS — T148XXA Other injury of unspecified body region, initial encounter: Secondary | ICD-10-CM | POA: Diagnosis not present

## 2023-05-03 DIAGNOSIS — C3490 Malignant neoplasm of unspecified part of unspecified bronchus or lung: Secondary | ICD-10-CM | POA: Diagnosis not present

## 2023-05-03 DIAGNOSIS — Z1152 Encounter for screening for COVID-19: Secondary | ICD-10-CM | POA: Diagnosis not present

## 2023-05-03 DIAGNOSIS — R110 Nausea: Secondary | ICD-10-CM | POA: Diagnosis not present

## 2023-05-03 DIAGNOSIS — Z7901 Long term (current) use of anticoagulants: Secondary | ICD-10-CM | POA: Diagnosis not present

## 2023-05-03 DIAGNOSIS — M791 Myalgia, unspecified site: Secondary | ICD-10-CM | POA: Diagnosis present

## 2023-05-03 DIAGNOSIS — R079 Chest pain, unspecified: Secondary | ICD-10-CM | POA: Diagnosis not present

## 2023-05-03 DIAGNOSIS — C801 Malignant (primary) neoplasm, unspecified: Secondary | ICD-10-CM | POA: Insufficient documentation

## 2023-05-03 DIAGNOSIS — T8189XA Other complications of procedures, not elsewhere classified, initial encounter: Secondary | ICD-10-CM | POA: Diagnosis not present

## 2023-05-03 HISTORY — DX: Malignant neoplasm of unspecified part of unspecified bronchus or lung: C34.90

## 2023-05-03 LAB — CBC WITH DIFFERENTIAL/PLATELET
Abs Immature Granulocytes: 0.04 10*3/uL (ref 0.00–0.07)
Basophils Absolute: 0 10*3/uL (ref 0.0–0.1)
Basophils Relative: 0 %
Eosinophils Absolute: 0.3 10*3/uL (ref 0.0–0.5)
Eosinophils Relative: 2 %
HCT: 41.4 % (ref 36.0–46.0)
Hemoglobin: 12.8 g/dL (ref 12.0–15.0)
Immature Granulocytes: 0 %
Lymphocytes Relative: 8 %
Lymphs Abs: 0.9 10*3/uL (ref 0.7–4.0)
MCH: 28.2 pg (ref 26.0–34.0)
MCHC: 30.9 g/dL (ref 30.0–36.0)
MCV: 91.2 fL (ref 80.0–100.0)
Monocytes Absolute: 1.1 10*3/uL — ABNORMAL HIGH (ref 0.1–1.0)
Monocytes Relative: 11 %
Neutro Abs: 8.1 10*3/uL — ABNORMAL HIGH (ref 1.7–7.7)
Neutrophils Relative %: 79 %
Platelets: 343 10*3/uL (ref 150–400)
RBC: 4.54 MIL/uL (ref 3.87–5.11)
RDW: 15.4 % (ref 11.5–15.5)
WBC: 10.4 10*3/uL (ref 4.0–10.5)
nRBC: 0 % (ref 0.0–0.2)

## 2023-05-03 LAB — COMPREHENSIVE METABOLIC PANEL
ALT: 22 U/L (ref 0–44)
AST: 21 U/L (ref 15–41)
Albumin: 3 g/dL — ABNORMAL LOW (ref 3.5–5.0)
Alkaline Phosphatase: 71 U/L (ref 38–126)
Anion gap: 13 (ref 5–15)
BUN: 17 mg/dL (ref 8–23)
CO2: 23 mmol/L (ref 22–32)
Calcium: 9.2 mg/dL (ref 8.9–10.3)
Chloride: 101 mmol/L (ref 98–111)
Creatinine, Ser: 1.45 mg/dL — ABNORMAL HIGH (ref 0.44–1.00)
GFR, Estimated: 38 mL/min — ABNORMAL LOW (ref >60)
Glucose, Bld: 108 mg/dL — ABNORMAL HIGH (ref 70–99)
Potassium: 3.7 mmol/L (ref 3.5–5.1)
Sodium: 137 mmol/L (ref 135–145)
Total Bilirubin: 0.6 mg/dL (ref 0.3–1.2)
Total Protein: 6.7 g/dL (ref 6.5–8.1)

## 2023-05-03 LAB — RESP PANEL BY RT-PCR (RSV, FLU A&B, COVID)  RVPGX2
Influenza A by PCR: NEGATIVE
Influenza B by PCR: NEGATIVE
Resp Syncytial Virus by PCR: NEGATIVE
SARS Coronavirus 2 by RT PCR: NEGATIVE

## 2023-05-03 MED ORDER — ONDANSETRON 4 MG PO TBDP
4.0000 mg | ORAL_TABLET | Freq: Once | ORAL | Status: AC
  Administered 2023-05-03: 4 mg via ORAL
  Filled 2023-05-03: qty 1

## 2023-05-03 NOTE — ED Notes (Signed)
ED Provider at bedside. 

## 2023-05-03 NOTE — ED Provider Notes (Signed)
[place] EMERGENCY DEPARTMENT AT [facility] Provider Note   CSN: [id number] Arrival date & time: 05/03/23  1149     History  No chief complaint on file.   Courtney Gill is a 77 y.o. female.  Patient concerned about drainage from the dressing she has where she had a left-sided chest tube.  Patient was recently admitted September 19 through September 24 for partial lobectomy of the lung.  This was for a left upper lobe lung nodule.  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  Patient has follow-up with cardiothoracic surgery tomorrow morning.  Patient states she has had some nausea decreased appetite chronic complaint of aching everywhere.  Temp here is 97.9 oxygen saturation has been 93% respiratory rate 18 blood pressure 138/62.  Patient states that where the chest tube was and where there is a black suture in place that there has been oozing of blood.       Home Medications Prior to Admission medications   Medication Sig Start Date End Date Taking? Authorizing Provider  albuterol (VENTOLIN HFA) 108 (90 Base) MCG/ACT inhaler Inhale 2 puffs into the lungs every 4 (four) hours as needed. 07/17/22   [provider]  amiodarone (PACERONE) 200 MG tablet Take 2 tablets (400 mg total) by mouth 2 (two) times daily. For one week;then take 200 mg daily thereafter 04/28/23   Gershon Crane E, PA-C  apixaban (ELIQUIS) 5 MG TABS tablet Take 1 tablet (5 mg total) by mouth 2 (two) times daily. 04/28/23   Gold, Glenice Laine, PA-C  cetirizine (ZYRTEC) 10 MG tablet Take 10 mg by mouth at bedtime.    [provider]  Cholecalciferol (VITAMIN D PO) Take 1 tablet by mouth in the morning.    [provider]  clopidogrel (PLAVIX) 75 MG tablet Take 1 tablet (75 mg total) by mouth daily with breakfast. 03/01/13   Jeanella Craze, NP  Cyanocobalamin (VITAMIN B-12 PO) Take 1 tablet by mouth in the morning.    [provider]   diphenhydrAMINE (BENADRYL) 25 MG tablet Take 25 mg by mouth at bedtime.    [provider]  EPINEPHrine 0.3 mg/0.3 mL IJ SOAJ injection Inject 0.3 mg into the muscle as needed. 08/18/22   Birder Robson, MD  gabapentin (NEURONTIN) 300 MG capsule Take 1 capsule (300 mg total) by mouth 2 (two) times daily. 04/28/23   Gold, Glenice Laine, PA-C  guaiFENesin (MUCINEX) 600 MG 12 hr tablet Take 1 tablet (600 mg total) by mouth 2 (two) times daily. 04/28/23   Gold, Wayne E, PA-C  ibuprofen (ADVIL) 200 MG tablet Take 400 mg by mouth every 8 (eight) hours as needed (pain).    [provider]  MAGNESIUM PO Take 1 capsule by mouth in the morning and at bedtime.    [provider]  metoprolol tartrate (LOPRESSOR) 25 MG tablet Take 1 tablet (25 mg total) by mouth 2 (two) times daily. 04/28/23   Gold, Glenice Laine, PA-C  Omega-3 Fatty Acids (OMEGA-3 FISH OIL PO) Take 1 capsule by mouth in the morning.    [provider]  pantoprazole (PROTONIX) 40 MG tablet Take 40 mg by mouth daily before breakfast.    [provider]  Polyethyl Glycol-Propyl Glycol (SYSTANE) 0.4-0.3 % GEL ophthalmic gel Place 1 Application into both eyes at bedtime as needed (dry/irritated eyes.).    [provider]  rosuvastatin (CRESTOR) 40 MG tablet Take 40 mg by mouth at  bedtime. 03/17/23   [provider]  traMADol (ULTRAM) 50 MG tablet Take 1 tablet (50 mg total) by mouth every 6 (six) hours as needed for up to 7 days (mild pain). 04/28/23 05/05/23  Rowe Clack, PA-C  vitamin E 400 UNIT capsule Take 400 Units by mouth in the morning.    [provider]      Allergies    Bee venom, Wasp venom, and Aspirin    Review of Systems   Review of Systems  Constitutional:  Positive for fatigue. Negative for chills and fever.  HENT:  Negative for ear pain and sore throat.   Eyes:  Negative for pain and visual disturbance.  Respiratory:  Negative for cough and shortness of breath.    Cardiovascular:  Negative for chest pain and palpitations.  Gastrointestinal:  Negative for abdominal pain and vomiting.  Genitourinary:  Negative for dysuria and hematuria.  Musculoskeletal:  Negative for arthralgias and back pain.  Skin:  Positive for wound. Negative for color change and rash.  Neurological:  Negative for seizures and syncope.  All other systems reviewed and are negative.   Physical Exam Updated Vital Signs BP 138/62 (BP Location: Right Arm)   Pulse 65   Temp 97.9 F (36.6 C) (Oral)   Resp 18   SpO2 93%  Physical Exam Vitals and nursing note reviewed.  Constitutional:      General: She is not in acute distress.    Appearance: Normal appearance. She is well-developed. She is not ill-appearing.  HENT:     Head: Normocephalic and atraumatic.  Eyes:     Conjunctiva/sclera: Conjunctivae normal.  Cardiovascular:     Rate and Rhythm: Normal rate and regular rhythm.     Heart sounds: No murmur heard. Pulmonary:     Effort: Pulmonary effort is normal. No respiratory distress.     Breath sounds: Normal breath sounds. No wheezing, rhonchi or rales.     Comments: Left lateral chest wall where the chest tube was in place.  Silk suture is in place.  There is serosanguineous oozing.  No purulent discharge no surrounding erythema.  No evidence of any significant hemorrhage. Chest:     Chest wall: Tenderness present.  Abdominal:     Palpations: Abdomen is soft.     Tenderness: There is no abdominal tenderness.  Musculoskeletal:        General: No swelling.     Cervical back: Neck supple.  Skin:    General: Skin is warm and dry.     Capillary Refill: Capillary refill takes less than 2 seconds.  Neurological:     General: No focal deficit present.     Mental Status: She is alert and oriented to person, place, and time.  Psychiatric:        Mood and Affect: Mood normal.     ED Results / Procedures / Treatments   Labs (all labs ordered are listed, but only  abnormal results are displayed) Labs Reviewed  CBC WITH DIFFERENTIAL/PLATELET - Abnormal; Notable for the following components:      Result Value   Neutro Abs 8.1 (*)    Monocytes Absolute 1.1 (*)    All other components within normal limits  COMPREHENSIVE METABOLIC PANEL - Abnormal; Notable for the following components:   Glucose, Bld 108 (*)    Creatinine, Ser 1.45 (*)    Albumin 3.0 (*)    GFR, Estimated 38 (*)    All other components within normal limits  RESP  PANEL BY RT-PCR (RSV, FLU A&B, COVID)  RVPGX2    EKG None  Radiology DG Chest 2 View  Result Date: 05/03/2023 CLINICAL DATA:  Recent lung cancer surgery, nausea, aching EXAM: CHEST - 2 VIEW COMPARISON:  04/28/2023 FINDINGS: The heart size and mediastinal contours are within normal limits. Subtle diffuse interstitial opacity of the left lung status post left upper lobectomy. The right lung is normally aerated. Osseous structures unremarkable. The visualized skeletal structures are unremarkable. IMPRESSION: Subtle diffuse interstitial opacity of the left lung status post left upper lobectomy, consistent with edema or infection. No focal airspace opacity. Electronically Signed   By: Jearld Lesch M.D.   On: 05/03/2023 13:21    Procedures Procedures    Medications Ordered in ED Medications  ondansetron (ZOFRAN-ODT) disintegrating tablet 4 mg (4 mg Oral Given 05/03/23 1218)    ED Course/ Medical Decision Making/ A&P                                 Medical Decision Making Amount and/or Complexity of Data Reviewed Labs: ordered. Radiology: ordered.  Risk Prescription drug management.  Patient's chest x-ray here's subtle diffuse interstitial opacity left lung status post left upper lobectomy consistent with edema or possible infection but patient is not acting like there is an infection.  No focal airspace disease.  COVID influenza and RSV testing is negative CBC no leukocytosis hemoglobin stable 12.8 platelets 343  complete metabolic panel liver function test electrolytes normal.  GFR is at 38.  Preoperative patient's GFR's were around 43.  So this is not significantly different.  And potassium was normal.  Wound dressed with Xeroform gauze and foam tape just to apply some pressure.  Patient has follow-up with CT surgery tomorrow.  No indications for admission at this point in time.  Patient is oxygen saturations are good around 93 to 95% on room air.  Final Clinical Impression(s) / ED Diagnoses Final diagnoses:  Drainage from wound  Adenocarcinoma [facility])    Rx / DC Orders ED Discharge Orders     None         Vanetta Mulders, MD 05/03/23 1933

## 2023-05-03 NOTE — Discharge Instructions (Signed)
Keep dressing in place.  Keep appointment to follow-up with CT surgery as scheduled for tomorrow.  Would recommend getting the chest x-ray in the morning.  Workup here today without any acute findings.  Oxygen saturations have been in the mid 90% range.  Continue to use your incentive spirometer.

## 2023-05-03 NOTE — ED Triage Notes (Signed)
Had lung cancer surgery at [facility] sep 19. Apt tomorrow with Careers adviser. Pt c/o nausea/decreased appetite, c/o aching everywhere. Pt is a/o. Color wnl. Ambulatory. No shob noted States has been having a good amount of blood coming from where they pulled a chest tube out at discharge. No ss of infection noted to this area but area oozing light colored blood with dressing applied last night already saturated.

## 2023-05-03 NOTE — ED Provider Triage Note (Signed)
Emergency Medicine Provider Triage Evaluation Note  Courtney Gill , a 77 y.o. female  was evaluated in triage.  Pt complains of nausea, decreased appetite, body aches. Pt had chest surgery by Dr. Dorris Fetch,  Pt complains of blood from site of tube   Review of Systems  Positive: nausea Negative: No shortness of breath  Physical Exam  BP 119/68 (BP Location: Right Arm)   Pulse 60   Temp 97.9 F (36.6 C) (Oral)   Resp (!) 22   SpO2 95%  Gen:   Awake, no distress   Resp:  Normal effort  MSK:   Moves extremities without difficulty  Other:    Medical Decision Making  Medically screening exam initiated at 1:40 PM.  Appropriate orders placed.  Courtney Gill was informed that the remainder of the evaluation will be completed by another provider, this initial triage assessment does not replace that evaluation, and the importance of remaining in the ED until their evaluation is complete.     Elson Areas, [place] 05/03/23 1343

## 2023-05-04 ENCOUNTER — Ambulatory Visit (INDEPENDENT_AMBULATORY_CARE_PROVIDER_SITE_OTHER): Payer: Self-pay | Admitting: Cardiothoracic Surgery

## 2023-05-04 ENCOUNTER — Encounter: Payer: Self-pay | Admitting: Cardiothoracic Surgery

## 2023-05-04 ENCOUNTER — Other Ambulatory Visit: Payer: Self-pay

## 2023-05-04 VITALS — HR 94 | Temp 99.0°F

## 2023-05-04 DIAGNOSIS — Z4889 Encounter for other specified surgical aftercare: Secondary | ICD-10-CM | POA: Insufficient documentation

## 2023-05-04 DIAGNOSIS — J90 Pleural effusion, not elsewhere classified: Secondary | ICD-10-CM

## 2023-05-04 DIAGNOSIS — C3492 Malignant neoplasm of unspecified part of left bronchus or lung: Secondary | ICD-10-CM

## 2023-05-04 MED ORDER — ONDANSETRON 8 MG PO TBDP: 8.0000 mg | ORAL_TABLET | Freq: Three times a day (TID) | ORAL | Status: AC | PRN

## 2023-05-04 NOTE — Progress Notes (Signed)
HPI: Patient examined and chest x-ray taken yesterday in the ED personally reviewed.  She is status post left robotic assisted thoracotomy and pulmonary resection.  She was discharged 6 days ago and developed serosanguineous drainage from one of the port incisions.  She was seen in the ED where chest x-ray was performed which shows postoperative changes but no significant pleural effusion or pneumothorax.  She presented today for suture removal and examination of the wound.  On exam the wound is not healed.  The silk suture is loose.  There is serosanguineous drainage from the wound.  There is no surrounding erythema.  A sterile culture of the fluid was taken and sent to microbiology.  The wound was prepped with Betadine, topical anesthesia with free solution was applied and the loose suture was removed and 2 new 3-0 nylon sutures were applied.  Sterile dressing was applied.  The patient and family are given supplies to change the dressing every day and as needed.  A follow-up office visit is scheduled for 3 days and wound culture will be followed up .  Antibiotics were not prescribed at this visit.  The patient requested more Zofran for persistent postoperative nausea.  She is given 1 tablet in the ED which helped significantly.  This was called into her local pharmacy. The patient required Current Outpatient Medications  Medication Sig Dispense Refill   albuterol (VENTOLIN HFA) 108 (90 Base) MCG/ACT inhaler Inhale 2 puffs into the lungs every 4 (four) hours as needed.     amiodarone (PACERONE) 200 MG tablet Take 2 tablets (400 mg total) by mouth 2 (two) times daily. For one week;then take 200 mg daily thereafter 60 tablet 1   apixaban (ELIQUIS) 5 MG TABS tablet Take 1 tablet (5 mg total) by mouth 2 (two) times daily. 60 tablet 1   cetirizine (ZYRTEC) 10 MG tablet Take 10 mg by mouth at bedtime.     Cholecalciferol (VITAMIN D PO) Take 1 tablet by mouth in the morning.     clopidogrel (PLAVIX) 75 MG  tablet Take 1 tablet (75 mg total) by mouth daily with breakfast. 30 tablet 3   Cyanocobalamin (VITAMIN B-12 PO) Take 1 tablet by mouth in the morning.     diphenhydrAMINE (BENADRYL) 25 MG tablet Take 25 mg by mouth at bedtime.     EPINEPHrine 0.3 mg/0.3 mL IJ SOAJ injection Inject 0.3 mg into the muscle as needed. 2 each 1   gabapentin (NEURONTIN) 300 MG capsule Take 1 capsule (300 mg total) by mouth 2 (two) times daily. 60 capsule 0   guaiFENesin (MUCINEX) 600 MG 12 hr tablet Take 1 tablet (600 mg total) by mouth 2 (two) times daily. 30 tablet 0   ibuprofen (ADVIL) 200 MG tablet Take 400 mg by mouth every 8 (eight) hours as needed (pain).     MAGNESIUM PO Take 1 capsule by mouth in the morning and at bedtime.     metoprolol tartrate (LOPRESSOR) 25 MG tablet Take 1 tablet (25 mg total) by mouth 2 (two) times daily. 60 tablet 1   Omega-3 Fatty Acids (OMEGA-3 FISH OIL PO) Take 1 capsule by mouth in the morning.     pantoprazole (PROTONIX) 40 MG tablet Take 40 mg by mouth daily before breakfast.     Polyethyl Glycol-Propyl Glycol (SYSTANE) 0.4-0.3 % GEL ophthalmic gel Place 1 Application into both eyes at bedtime as needed (dry/irritated eyes.).     rosuvastatin (CRESTOR) 40 MG tablet Take 40 mg by mouth at bedtime.  traMADol (ULTRAM) 50 MG tablet Take 1 tablet (50 mg total) by mouth every 6 (six) hours as needed for up to 7 days (mild pain). 28 tablet 0   vitamin E 400 UNIT capsule Take 400 Units by mouth in the morning.     Current Facility-Administered Medications  Medication Dose Route Frequency Provider Last Rate Last Admin   ondansetron (ZOFRAN-ODT) disintegrating tablet 8 mg  8 mg Oral Q8H PRN          Physical Exam: Pulse 94, temperature 99 F (37.2 C).  Alert and appropriate Breath sounds equal bilaterally Thoracotomy port incisions healing except for the port which is leaking serosanguineous fluid. After removing the suture some fluid was drained and cultured and 2 new 3-0  nylon sutures were applied with topical anesthesia. Heart rate is regular at 90 Was intact  Diagnostic Tests: None  Impression: Serosanguineous drainage bleeding from chest tube site which was cultured, drained, and reclosed with sutures.  Plan: Patient will return for wound check on Friday, October 4. Zofran was called in to her pharmacy. Wound care instructions were provided to the family and supplies provided as well. Lovett Sox, MD [facility] [phone]

## 2023-05-07 LAB — WOUND CULTURE
MICRO NUMBER:: 15530154
SPECIMEN QUALITY:: ADEQUATE

## 2023-05-08 ENCOUNTER — Other Ambulatory Visit: Payer: Self-pay | Admitting: Cardiothoracic Surgery

## 2023-05-08 ENCOUNTER — Other Ambulatory Visit: Payer: Self-pay | Admitting: *Deleted

## 2023-05-08 ENCOUNTER — Encounter: Payer: Self-pay | Admitting: Cardiothoracic Surgery

## 2023-05-08 ENCOUNTER — Other Ambulatory Visit: Payer: Self-pay

## 2023-05-08 ENCOUNTER — Ambulatory Visit (INDEPENDENT_AMBULATORY_CARE_PROVIDER_SITE_OTHER): Payer: Self-pay | Admitting: Cardiothoracic Surgery

## 2023-05-08 ENCOUNTER — Ambulatory Visit
Admission: RE | Admit: 2023-05-08 | Discharge: 2023-05-08 | Disposition: A | Discharge status: Home / Self Care | Payer: Medicare HMO | Source: Ambulatory Visit | Attending: Cardiothoracic Surgery | Admitting: Cardiothoracic Surgery

## 2023-05-08 VITALS — BP 103/56 | HR 51 | Resp 18 | Wt 141.0 lb

## 2023-05-08 DIAGNOSIS — J90 Pleural effusion, not elsewhere classified: Secondary | ICD-10-CM

## 2023-05-08 DIAGNOSIS — R918 Other nonspecific abnormal finding of lung field: Secondary | ICD-10-CM | POA: Diagnosis not present

## 2023-05-08 DIAGNOSIS — Z902 Acquired absence of lung [part of]: Secondary | ICD-10-CM | POA: Diagnosis not present

## 2023-05-08 DIAGNOSIS — Z5189 Encounter for other specified aftercare: Secondary | ICD-10-CM | POA: Insufficient documentation

## 2023-05-08 MED ORDER — ONDANSETRON 8 MG PO TBDP: 8.0000 mg | ORAL_TABLET | Freq: Three times a day (TID) | ORAL | 0 refills | Status: DC | PRN

## 2023-05-08 MED ORDER — METOPROLOL TARTRATE 25 MG PO TABS: 12.5000 mg | ORAL_TABLET | Freq: Two times a day (BID) | ORAL | 1 refills | Status: DC

## 2023-05-08 NOTE — Progress Notes (Signed)
The proposed treatment discussed in conference is for discussion purpose only and is not a binding recommendation.  The patients have not been physically examined, or presented with their treatment options.  Therefore, final treatment plans cannot be decided.  

## 2023-05-08 NOTE — Progress Notes (Signed)
HPI:    The patient returns with chest x-ray for scheduled follow-up and wound check.  Her last visit was for significant serosanguineous drainage from her left chest tube site that was persistent in which led to an ER visit and referral back to the office.  The chest tube site is separated and fluid was expressed and cultured.  The culture was negative.  Under local anesthesia 2 nylon sutures were placed and the loose original silk suture removed.  Since that visit there has been no more drainage.  Follow-up chest x-ray today shows no accumulation of pleural fluid and normal postoperative chest x-ray.  The chest tube site is clean and dry and applied a Band-Aid.  The patient will return to see Dr. Orson Aloe next week at which time the skin sutures probably could be removed.  Otherwise the patient is having persistent nausea and constipation.  Sublingual Zofran tablets were prescribed and I recommended some magnesium citrate since she had gone 2 weeks without a BM.  She is in sinus rhythm with a heart rate of 50 in the metoprolol prescription was decreased to 12.5 mg twice daily.  Current Outpatient Medications  Medication Sig Dispense Refill   albuterol (VENTOLIN HFA) 108 (90 Base) MCG/ACT inhaler Inhale 2 puffs into the lungs every 4 (four) hours as needed.     amiodarone (PACERONE) 200 MG tablet Take 2 tablets (400 mg total) by mouth 2 (two) times daily. For one week;then take 200 mg daily thereafter 60 tablet 1   apixaban (ELIQUIS) 5 MG TABS tablet Take 1 tablet (5 mg total) by mouth 2 (two) times daily. 60 tablet 1   cetirizine (ZYRTEC) 10 MG tablet Take 10 mg by mouth at bedtime.     Cholecalciferol (VITAMIN D PO) Take 1 tablet by mouth in the morning.     clopidogrel (PLAVIX) 75 MG tablet Take 1 tablet (75 mg total) by mouth daily with breakfast. 30 tablet 3   Cyanocobalamin (VITAMIN B-12 PO) Take 1 tablet by mouth in the morning.     diphenhydrAMINE (BENADRYL) 25 MG tablet Take 25 mg by mouth  at bedtime.     EPINEPHrine 0.3 mg/0.3 mL IJ SOAJ injection Inject 0.3 mg into the muscle as needed. 2 each 1   gabapentin (NEURONTIN) 300 MG capsule Take 1 capsule (300 mg total) by mouth 2 (two) times daily. 60 capsule 0   guaiFENesin (MUCINEX) 600 MG 12 hr tablet Take 1 tablet (600 mg total) by mouth 2 (two) times daily. 30 tablet 0   ibuprofen (ADVIL) 200 MG tablet Take 400 mg by mouth every 8 (eight) hours as needed (pain).     MAGNESIUM PO Take 1 capsule by mouth in the morning and at bedtime.     Omega-3 Fatty Acids (OMEGA-3 FISH OIL PO) Take 1 capsule by mouth in the morning.     pantoprazole (PROTONIX) 40 MG tablet Take 40 mg by mouth daily before breakfast.     Polyethyl Glycol-Propyl Glycol (SYSTANE) 0.4-0.3 % GEL ophthalmic gel Place 1 Application into both eyes at bedtime as needed (dry/irritated eyes.).     rosuvastatin (CRESTOR) 40 MG tablet Take 40 mg by mouth at bedtime.     vitamin E 400 UNIT capsule Take 400 Units by mouth in the morning.     metoprolol tartrate (LOPRESSOR) 25 MG tablet Take 0.5 tablets (12.5 mg total) by mouth 2 (two) times daily. 60 tablet 1   ondansetron (ZOFRAN-ODT) 8 MG disintegrating tablet Take 1 tablet (8  mg total) by mouth every 8 (eight) hours as needed for nausea or vomiting. 20 tablet 0   Current Facility-Administered Medications  Medication Dose Route Frequency Provider Last Rate Last Admin   ondansetron (ZOFRAN-ODT) disintegrating tablet 8 mg  8 mg Oral Q8H PRN          Physical Exam:      Exam     Blood pressure (!) 103/56, pulse (!) 51, resp. rate 18, weight 141 lb (64 kg), SpO2 94%.     General- alert and comfortable    Neck- no JVD, no cervical adenopathy palpable, no carotid bruit   Lungs- clear without rales, wheezes.  Chest incisions clean and dry.   Cor- regular rate and rhythm, no murmur , gallop   Abdomen- soft, non-tender   Extremities - warm, non-tender, minimal edema   Neuro- oriented, appropriate, no focal  weakness   Diagnostic Tests: Today's chest x-ray reviewed showing clear left lung without effusion or pneumothorax.  Impression: Doing well status post left upper lobectomy for adenocarcinoma  Plan: Patient was told she could shower and place a clean Band-Aid on the chest tube site daily. She will reduce her dose of Toprol all due to her slow heart rate.   Lovett Sox, MD [facility] [phone]

## 2023-05-11 ENCOUNTER — Other Ambulatory Visit: Payer: Self-pay | Admitting: Thoracic Surgery (Cardiothoracic Vascular Surgery)

## 2023-05-11 ENCOUNTER — Telehealth: Payer: Self-pay | Admitting: *Deleted

## 2023-05-11 ENCOUNTER — Ambulatory Visit: Payer: Medicare HMO | Admitting: Thoracic Surgery (Cardiothoracic Vascular Surgery)

## 2023-05-11 DIAGNOSIS — R600 Localized edema: Secondary | ICD-10-CM

## 2023-05-11 NOTE — Telephone Encounter (Signed)
Patient called inquiring whether or not a CXR is needed prior to appt tomorrow with Dr. Dorris Fetch. CXR was done Friday 10/4. Per Dr. Donata Clay, no follow up CXR is needed for Tuesday's appt. Patient denies any change in respiratory status. Patient states she is experiencing pain in bilateral legs. Denies swelling or redness. States lateral aspects of lower extremities are warm to touch bilaterally. Patient compares pain to pain she experienced years ago when she had a blood clot. Discussed with Gaynelle Arabian, PA, ultrasounds of bilateral LE ordered to rule out DVT. Patient aware of appt.

## 2023-05-12 ENCOUNTER — Ambulatory Visit (INDEPENDENT_AMBULATORY_CARE_PROVIDER_SITE_OTHER): Payer: Self-pay | Admitting: Thoracic Surgery (Cardiothoracic Vascular Surgery)

## 2023-05-12 ENCOUNTER — Ambulatory Visit (HOSPITAL_COMMUNITY)
Admission: RE | Admit: 2023-05-12 | Discharge: 2023-05-12 | Disposition: A | Discharge status: Home / Self Care | Payer: Medicare HMO | Source: Ambulatory Visit | Attending: Cardiovascular Disease | Admitting: Cardiovascular Disease

## 2023-05-12 VITALS — BP 101/59 | HR 46 | Resp 18 | Ht 63.0 in | Wt 157.0 lb

## 2023-05-12 DIAGNOSIS — Z09 Encounter for follow-up examination after completed treatment for conditions other than malignant neoplasm: Secondary | ICD-10-CM

## 2023-05-12 DIAGNOSIS — C3492 Malignant neoplasm of unspecified part of left bronchus or lung: Secondary | ICD-10-CM

## 2023-05-12 DIAGNOSIS — R600 Localized edema: Secondary | ICD-10-CM | POA: Insufficient documentation

## 2023-05-12 NOTE — Progress Notes (Signed)
[address]       Courtney Gill [address]             [phone]     HPI: Courtney Gill returns for a follow-up visit after recent left upper lobectomy.  Courtney Gill is a 77 year old woman with a history of tobacco abuse, COPD, reflux, hypertension, hyperlipidemia, PAD, carotid stent, moderate aortic stenosis, stage III chronic kidney disease, migraines, alpha gal, angioedema, and adenocarcinoma of the left upper lobe.  Found to have a left upper lobe lung nodule low-dose CT for lung cancer screening in May 2024.  Borderline mediastinal adenopathy.  PET/CT showed the nodule was hypermetabolic.  She had some mildly enlarged mediastinal nodes that had a low-grade activity.  Felt to be reactive.  I did an endobronchial ultrasound followed by a robotic left upper lobectomy on 04/23/2023.  Lymph node aspirations were negative.  The nodule turned out to be an adenocarcinoma with visceral pleural invasion.  Final pathology was T2a, N0, stage Ib.  Postoperatively she had atrial fibrillation.  She finally went home on amiodarone and Eliquis.  She was already on Plavix prior to surgery.  Overall she is feeling reasonably well.  She does have some incisional discomfort.  She is taking gabapentin but not taking tramadol as it was causing a lot of nausea.  She normally takes ibuprofen for pain but has not been using it since she is on Plavix and Eliquis.  Has not tried Tylenol.  She did complain of some pain in her lower legs.  No swelling per se.  She had a duplex this morning which showed no evidence of DVT.  Past Medical History:  Diagnosis Date   Angioedema 02/2013   Arthritis    Dyspnea    with exertion   GERD (gastroesophageal reflux disease)    Hypercholesterolemia    Hypertension    pt denies this (04/21/23)   Left upper lobe pulmonary nodule    Lung cancer (HCC)    Migraine    Moderate aortic stenosis    Mean gradient 21.5 mmHg, AVA 1.24 cm by echo 03/2023   Peripheral  artery disease (HCC)      Current Outpatient Medications  Medication Sig Dispense Refill   albuterol (VENTOLIN HFA) 108 (90 Base) MCG/ACT inhaler Inhale 2 puffs into the lungs every 4 (four) hours as needed.     amiodarone (PACERONE) 200 MG tablet Take 2 tablets (400 mg total) by mouth 2 (two) times daily. For one week;then take 200 mg daily thereafter 60 tablet 1   apixaban (ELIQUIS) 5 MG TABS tablet Take 1 tablet (5 mg total) by mouth 2 (two) times daily. 60 tablet 1   cetirizine (ZYRTEC) 10 MG tablet Take 10 mg by mouth at bedtime.     Cholecalciferol (VITAMIN D PO) Take 1 tablet by mouth in the morning.     clopidogrel (PLAVIX) 75 MG tablet Take 1 tablet (75 mg total) by mouth daily with breakfast. 30 tablet 3   Cyanocobalamin (VITAMIN B-12 PO) Take 1 tablet by mouth in the morning.     diphenhydrAMINE (BENADRYL) 25 MG tablet Take 25 mg by mouth at bedtime.     EPINEPHrine 0.3 mg/0.3 mL IJ SOAJ injection Inject 0.3 mg into the muscle as needed. 2 each 1   gabapentin (NEURONTIN) 300 MG capsule Take 1 capsule (300 mg total) by mouth 2 (two) times daily. 60 capsule 0   guaiFENesin (MUCINEX) 600 MG 12 hr tablet Take 1  tablet (600 mg total) by mouth 2 (two) times daily. 30 tablet 0   ibuprofen (ADVIL) 200 MG tablet Take 400 mg by mouth every 8 (eight) hours as needed (pain).     MAGNESIUM PO Take 1 capsule by mouth in the morning and at bedtime.     metoprolol tartrate (LOPRESSOR) 25 MG tablet Take 0.5 tablets (12.5 mg total) by mouth 2 (two) times daily. 60 tablet 1   Omega-3 Fatty Acids (OMEGA-3 FISH OIL PO) Take 1 capsule by mouth in the morning.     ondansetron (ZOFRAN-ODT) 8 MG disintegrating tablet Take 1 tablet (8 mg total) by mouth every 8 (eight) hours as needed for nausea or vomiting. 20 tablet 0   pantoprazole (PROTONIX) 40 MG tablet Take 40 mg by mouth daily before breakfast.     Polyethyl Glycol-Propyl Glycol (SYSTANE) 0.4-0.3 % GEL ophthalmic gel Place 1 Application into both eyes  at bedtime as needed (dry/irritated eyes.).     rosuvastatin (CRESTOR) 40 MG tablet Take 40 mg by mouth at bedtime.     vitamin E 400 UNIT capsule Take 400 Units by mouth in the morning.     Current Facility-Administered Medications  Medication Dose Route Frequency Provider Last Rate Last Admin   ondansetron (ZOFRAN-ODT) disintegrating tablet 8 mg  8 mg Oral Q8H PRN         Physical Exam BP (!) 101/59 (BP Location: Left Arm, Patient Position: Sitting)   Pulse (!) 46   Resp 18   Ht 5\' 3"  (1.6 m)   Wt 157 lb (71.2 kg)   SpO2 95% Comment: RA  BMI 27.5 kg/m  77 year old woman in no acute distress Alert and oriented x 3 with no focal deficits Lungs diminished on the left but otherwise clear Incisions clean dry intact with mild erythema at the chest tube site Cardiac regular rate and rhythm  Diagnostic Tests: I personally reviewed her chest x-ray images.  Shows postoperative changes from recent left upper lobectomy.  Impression: Courtney Gill is a 77 year old woman with a history of tobacco abuse, COPD, reflux, hypertension, hyperlipidemia, PAD, carotid stent, moderate aortic stenosis, stage III chronic kidney disease, migraines, alpha gal, angioedema, and adenocarcinoma of the left upper lobe.  Stage Ib adenocarcinoma left upper lobe-status post left upper lobectomy.  She denied any adjuvant therapy.  Will arrange for consultation with Dr. Ellin Saba for follow-up.  From a surgical standpoint she is doing well.  She does have some discomfort but is not requiring any narcotics.  Should continue to improve with time.  She may gradually increase her activities as tolerated.  Postoperative atrial fibrillation-she is on amiodarone 200 mg daily and also on Eliquis.  She has follow-up scheduled with cardiology next week.  Plan: Referral to Dr. Ellin Saba Follow-up with cardiology Return in 1 month with PA and lateral chest x-ray to check on progress  Loreli Slot, MD [facility] [phone]

## 2023-05-17 NOTE — Progress Notes (Unsigned)
Cardiology Clinic Note   Patient Name: Courtney Gill Date of Encounter: 05/19/2023  Primary Care Provider:  Benita Stabile, MD Primary Cardiologist:  None  Patient Profile    Courtney Gill 77 year old female presents the clinic today for follow-up evaluation of her persistent atrial fibrillation, PAD, and hypertension.  Past Medical History    Past Medical History:  Diagnosis Date   Angioedema 02/2013   Arthritis    Dyspnea    with exertion   GERD (gastroesophageal reflux disease)    Hypercholesterolemia    Hypertension    pt denies this (04/21/23)   Left upper lobe pulmonary nodule    Lung cancer (HCC)    Migraine    Moderate aortic stenosis    Mean gradient 21.5 mmHg, AVA 1.24 cm by echo 03/2023   Peripheral artery disease [facility])    Past Surgical History:  Procedure Laterality Date   CAROTID STENT Right 08/04/2005   DILATION AND CURETTAGE OF UTERUS     EYE SURGERY     "muscle moved"   INTERCOSTAL NERVE BLOCK  04/23/2023   Procedure: INTERCOSTAL NERVE BLOCK;  Surgeon: Loreli Slot, MD;  Location: [facility] OR;  Service: Thoracic;;   LYMPH NODE BIOPSY Left 04/23/2023   Procedure: LYMPH NODE BIOPSY;  Surgeon: Loreli Slot, MD;  Location: [facility] OR;  Service: Thoracic;  Laterality: Left;   TONSILLECTOMY     in early 20's   VIDEO BRONCHOSCOPY WITH ENDOBRONCHIAL ULTRASOUND N/A 04/23/2023   Procedure: VIDEO BRONCHOSCOPY WITH ENDOBRONCHIAL ULTRASOUND;  Surgeon: Loreli Slot, MD;  Location: [facility] OR;  Service: Thoracic;  Laterality: N/A;    Allergies  Allergies  Allergen Reactions   Bee Venom Anaphylaxis   Wasp Venom Anaphylaxis   Aspirin Swelling    History of Present Illness    Courtney Gill has a PMH of peripheral arterial disease, carotid stenosis status post stent, moderate aortic stenosis, CKD stage III, COPD, postoperative atrial fibrillation, and hypertension.  She was seen and evaluated by cardiology on 04/28/2023 for consultation for atrial  fibrillation.  She had undergone left upper lobe wedge resection for lung CA.  She initially had atrial fibrillation postoperatively and converted back to sinus rhythm on amiodarone.  She had recurrence of atrial fibrillation on postop day 4.  It was felt that her atrial fibrillation was related to her lung surgery.  Amiodarone 400 mg twice daily for 1 week then transition to 200 mg daily was recommended.  1 month of amiodarone therapy was recommended.  Metoprolol was also added to her medication regimen.  She was also taking apixaban 5 mg twice daily follow-up was planned in 4 to 6 weeks.  She presents to the clinic today for follow-up evaluation and states she had a fall after leaving a surgeons office.  She did not present for evaluation.  She denies bleeding issues.  She is neurologically intact.  She is slowly been progressing her physical activity.  She denies palpitations or accelerated heart rate.  I will order a 30-day cardiac event monitor, have her continue to increase her physical activity as tolerated and plan follow-up in 3 to 4 months.  Today she denies chest pain, shortness of breath, lower extremity edema, fatigue, palpitations, melena, hematuria, hemoptysis, diaphoresis, weakness, presyncope, syncope, orthopnea, and PND.    Home Medications    Prior to Admission medications   Medication Sig Start Date End Date Taking? Authorizing Provider  albuterol (VENTOLIN HFA) 108 (90 Base) MCG/ACT inhaler Inhale 2  puffs into the lungs every 4 (four) hours as needed. 07/17/22   [provider]  amiodarone (PACERONE) 200 MG tablet Take 2 tablets (400 mg total) by mouth 2 (two) times daily. For one week;then take 200 mg daily thereafter 04/28/23   Gershon Crane E, PA-C  apixaban (ELIQUIS) 5 MG TABS tablet Take 1 tablet (5 mg total) by mouth 2 (two) times daily. 04/28/23   Gold, Glenice Laine, PA-C  cetirizine (ZYRTEC) 10 MG tablet Take 10 mg by mouth at bedtime.    [provider]   Cholecalciferol (VITAMIN D PO) Take 1 tablet by mouth in the morning.    [provider]  clopidogrel (PLAVIX) 75 MG tablet Take 1 tablet (75 mg total) by mouth daily with breakfast. 03/01/13   Jeanella Craze, NP  Cyanocobalamin (VITAMIN B-12 PO) Take 1 tablet by mouth in the morning.    [provider]  diphenhydrAMINE (BENADRYL) 25 MG tablet Take 25 mg by mouth at bedtime.    [provider]  EPINEPHrine 0.3 mg/0.3 mL IJ SOAJ injection Inject 0.3 mg into the muscle as needed. 08/18/22   Birder Robson, MD  gabapentin (NEURONTIN) 300 MG capsule Take 1 capsule (300 mg total) by mouth 2 (two) times daily. 04/28/23   Gold, Glenice Laine, PA-C  guaiFENesin (MUCINEX) 600 MG 12 hr tablet Take 1 tablet (600 mg total) by mouth 2 (two) times daily. 04/28/23   Gold, Wayne E, PA-C  ibuprofen (ADVIL) 200 MG tablet Take 400 mg by mouth every 8 (eight) hours as needed (pain).    [provider]  MAGNESIUM PO Take 1 capsule by mouth in the morning and at bedtime.    [provider]  metoprolol tartrate (LOPRESSOR) 25 MG tablet Take 0.5 tablets (12.5 mg total) by mouth 2 (two) times daily. 05/08/23   Lovett Sox, MD  Omega-3 Fatty Acids (OMEGA-3 FISH OIL PO) Take 1 capsule by mouth in the morning.    [provider]  ondansetron (ZOFRAN-ODT) 8 MG disintegrating tablet Take 1 tablet (8 mg total) by mouth every 8 (eight) hours as needed for nausea or vomiting. 05/08/23   Lovett Sox, MD  pantoprazole (PROTONIX) 40 MG tablet Take 40 mg by mouth daily before breakfast.    [provider]  Polyethyl Glycol-Propyl Glycol (SYSTANE) 0.4-0.3 % GEL ophthalmic gel Place 1 Application into both eyes at bedtime as needed (dry/irritated eyes.).    [provider]  rosuvastatin (CRESTOR) 40 MG tablet Take 40 mg by mouth at bedtime. 03/17/23   [provider]  vitamin E 400 UNIT capsule Take 400 Units by mouth in the morning.    [provider]     Family History    Family History  Problem Relation Age of Onset   Atrial fibrillation Mother    Diabetes Maternal Grandfather    Cancer Paternal Grandfather    She indicated that her mother is deceased. She indicated that her father is alive. She indicated that the status of her maternal grandfather is unknown. She indicated that the status of her paternal grandfather is unknown.  Social History    Social History   Socioeconomic History   Marital status: Married    Spouse name: Not on file   Number of children: 1   Years of education: Not on file   Highest education level: Not on file  Occupational History   Not on file  Tobacco Use   Smoking status: Every Day  Current packs/day: 1.00    Types: Cigarettes   Smokeless tobacco: Never  Vaping Use   Vaping status: Never Used  Substance and Sexual Activity   Alcohol use: Not Currently   Drug use: No   Sexual activity: Not on file  Other Topics Concern   Not on file  Social History Narrative   Lives home with husband, drinks caffeine 2 cups daily.  Education HS.  Children 1 son, 4 stepchildren.   Social Determinants of Health   Financial Resource Strain: Not on file  Food Insecurity: No Food Insecurity (04/23/2023)   Hunger Vital Sign    Worried About Running Out of Food in the Last Year: Never true    Ran Out of Food in the Last Year: Never true  Transportation Needs: No Transportation Needs (04/23/2023)   PRAPARE - Administrator, Civil Service (Medical): No    Lack of Transportation (Non-Medical): No  Physical Activity: Not on file  Stress: Not on file  Social Connections: Not on file  Intimate Partner Violence: Not At Risk (04/23/2023)   Humiliation, Afraid, Rape, and Kick questionnaire    Fear of Current or Ex-Partner: No    Emotionally Abused: No    Physically Abused: No    Sexually Abused: No     Review of Systems    General:  No chills, fever, night sweats or weight changes.   Cardiovascular:  No chest pain, dyspnea on exertion, edema, orthopnea, palpitations, paroxysmal nocturnal dyspnea. Dermatological: No rash, lesions/masses Respiratory: No cough, dyspnea Urologic: No hematuria, dysuria Abdominal:   No nausea, vomiting, diarrhea, bright red blood per rectum, melena, or hematemesis Neurologic:  No visual changes, wkns, changes in mental status. All other systems reviewed and are otherwise negative except as noted above.  Physical Exam    VS:  BP 110/78 (BP Location: Left Arm, Patient Position: Sitting, Cuff Size: Normal)   Pulse (!) 50   Ht 5\' 3"  (1.6 m)   Wt 155 lb 9.6 oz (70.6 kg)   SpO2 95%   BMI 27.56 kg/m  , BMI Body mass index is 27.56 kg/m. GEN: Well nourished, well developed, in no acute distress. HEENT: normal. Neck: Supple, no JVD, carotid bruits, or masses. Cardiac: RRR, systolic murmur heard along right sternal border 3/6, rubs, or gallops. No clubbing, cyanosis, edema.  Radials/DP/PT 2+ and equal bilaterally.  Respiratory:  Respirations regular and unlabored, clear to auscultation bilaterally. GI: Soft, nontender, nondistended, BS + x 4. MS: no deformity or atrophy. Skin: warm and dry, no rash.  Ecchymosis along right hip.  Surgical site healing well no signs of infection. Neuro:  Strength and sensation are intact. Psych: Normal affect.  Accessory Clinical Findings    Recent Labs: 05/03/2023: ALT 22; BUN 17; Creatinine, Ser 1.45; Hemoglobin 12.8; Platelets 343; Potassium 3.7; Sodium 137   Recent Lipid Panel No results found for: "CHOL", "TRIG", "HDL", "CHOLHDL", "VLDL", "LDLCALC", "LDLDIRECT"       ECG personally reviewed by me today- EKG Interpretation Date/Time:  Tuesday May 19 2023 10:14:07 EDT Ventricular Rate:  50 PR Interval:  150 QRS Duration:  82 QT Interval:  374 QTC Calculation: 340 R Axis:   53  Text Interpretation: Sinus bradycardia Confirmed by Edd Fabian [phone]) on 05/19/2023 10:16:51 AM        Echocardiogram 03/19/2023  IMPRESSIONS     1. Left ventricular ejection fraction, by estimation, is 65 to 70%. The  left ventricle has normal function. The left ventricle has no regional  wall motion abnormalities. There is moderate left ventricular hypertrophy.  Left ventricular diastolic  parameters are consistent with Grade I diastolic dysfunction (impaired  relaxation). Elevated left atrial pressure.   2. Right ventricular systolic function is normal. The right ventricular  size is normal. There is normal pulmonary artery systolic pressure.   3. Left atrial size was moderately dilated.   4. The mitral valve is abnormal. Trivial mitral valve regurgitation. No  evidence of mitral stenosis. Moderate mitral annular calcification.   5. The tricuspid valve is abnormal.   6. The aortic valve is tricuspid. There is moderate calcification of the  aortic valve. There is moderate thickening of the aortic valve. Aortic  valve regurgitation is mild. Moderate aortic valve stenosis. Aortic valve  mean gradient measures 21.5 mmHg.  Aortic valve peak gradient measures 35.5 mmHg. Aortic valve area, by VTI  measures 1.24 cm.   7. The inferior vena cava is normal in size with greater than 50%  respiratory variability, suggesting right atrial pressure of 3 mmHg.   FINDINGS   Left Ventricle: Left ventricular ejection fraction, by estimation, is 65  to 70%. The left ventricle has normal function. The left ventricle has no  regional wall motion abnormalities. The left ventricular internal cavity  size was normal in size. There is   moderate left ventricular hypertrophy. Left ventricular diastolic  parameters are consistent with Grade I diastolic dysfunction (impaired  relaxation). Elevated left atrial pressure.   Right Ventricle: The right ventricular size is normal. Right vetricular  wall thickness was not well visualized. Right ventricular systolic  function is normal. There is normal  pulmonary artery systolic pressure.  The tricuspid regurgitant velocity is 2.45  m/s, and with an assumed right atrial pressure of 3 mmHg, the estimated  right ventricular systolic pressure is 27.0 mmHg.   Left Atrium: Left atrial size was moderately dilated.   Right Atrium: Right atrial size was normal in size.   Pericardium: There is no evidence of pericardial effusion.   Mitral Valve: The mitral valve is abnormal. There is moderate thickening  of the mitral valve leaflet(s). There is moderate calcification of the  mitral valve leaflet(s). Moderate mitral annular calcification. Trivial  mitral valve regurgitation. No  evidence of mitral valve stenosis.   Tricuspid Valve: The tricuspid valve is abnormal. Tricuspid valve  regurgitation is mild . No evidence of tricuspid stenosis.   Aortic Valve: The aortic valve is tricuspid. There is moderate  calcification of the aortic valve. There is moderate thickening of the  aortic valve. There is moderate aortic valve annular calcification. Aortic  valve regurgitation is mild. Moderate aortic  stenosis is present. Aortic valve mean gradient measures 21.5 mmHg. Aortic  valve peak gradient measures 35.5 mmHg. Aortic valve area, by VTI measures  1.24 cm.   Pulmonic Valve: The pulmonic valve was not well visualized. Pulmonic valve  regurgitation is mild. No evidence of pulmonic stenosis.   Aorta: The aortic root is normal in size and structure.   Venous: The inferior vena cava is normal in size with greater than 50%  respiratory variability, suggesting right atrial pressure of 3 mmHg.   IAS/Shunts: No atrial level shunt detected by color flow Doppler.      Assessment & Plan   1.  Postoperative atrial fibrillation-EKG today shows sinus bradycardia.  Noted in the setting of wedge resection for lung CA.  Converted back to sinus rhythm on amiodarone.  Had another episode of atrial fibrillation on postop day 4.  She  was placed on amiodarone  400 mg twice daily for 1 week and then 200 mg daily.  Plan was to continue amiodarone x 1 month.  Reports compliance with apixaban.  Denies bleeding issues. Continue metoprolol, amiodarone, apixaban Avoid triggers caffeine, chocolate, EtOH, dehydration etc. Order 30-day cardiac event monitor  Essential hypertension-BP today 110/78. Maintain blood pressure log Continue metoprolol Heart healthy low-sodium diet  Moderate aortic stenosis-breathing at baseline.  Continues to recover from surgery.  Echocardiogram 03/19/2023 showed LVEF of 65-70%, G1 DD, moderate calcification of aortic valve with moderate thickening, and moderate aortic valve stenosis. Continue rosuvastatin Plan for repeat echocardiogram 8/25  Disposition: Follow-up with Dr. Flora Lipps or me in 3-4 months.   Thomasene Ripple. Genesis Paget NP-C     05/19/2023, 10:16 AM [facility] [address] Office [phone] Fax [phone]    I spent 14 minutes examining this patient, reviewing medications, and using patient centered shared decision making involving her cardiac care.   I spent greater than 20 minutes reviewing her past medical history,  medications, and prior cardiac tests.

## 2023-05-19 ENCOUNTER — Ambulatory Visit: Payer: Medicare HMO | Attending: General Practice | Admitting: General Practice

## 2023-05-19 ENCOUNTER — Encounter: Payer: Self-pay | Admitting: General Practice

## 2023-05-19 VITALS — BP 110/78 | HR 50 | Ht 63.0 in | Wt 155.6 lb

## 2023-05-19 DIAGNOSIS — I10 Essential (primary) hypertension: Secondary | ICD-10-CM | POA: Diagnosis not present

## 2023-05-19 DIAGNOSIS — I350 Nonrheumatic aortic (valve) stenosis: Secondary | ICD-10-CM | POA: Diagnosis not present

## 2023-05-19 DIAGNOSIS — I4891 Unspecified atrial fibrillation: Secondary | ICD-10-CM

## 2023-05-19 NOTE — Patient Instructions (Addendum)
Medication Instructions:  The current medical regimen is effective;  continue present plan and medications as directed. Please refer to the Current Medication list given to you today.  *If you need a refill on your cardiac medications before your next appointment, please call your pharmacy*  Lab Work: NONE  Testing/Procedures: 30 DAY MONITOR-Your physician has recommended that you wear an event monitor. Event monitors are medical devices that record the heart's electrical activity. Doctors most often [place] these monitors to diagnose arrhythmias. Arrhythmias are problems with the speed or rhythm of the heartbeat. The monitor is a small, portable device. You can wear one while you do your normal daily activities. This is usually used to diagnose what is causing palpitations/syncope (passing out).    Follow-Up: At [facility], you and your health needs are our priority.  As part of our continuing mission to provide you with exceptional heart care, we have created designated Provider Care Teams.  These Care Teams include your primary Cardiologist (physician) and Advanced Practice Providers (APPs -  Physician Assistants and Nurse Practitioners) who all work together to provide you with the care you need, when you need it.  Your next appointment:   AFTER MONITOR (SHOULD BE ABOUT 3 MONTHS)   Provider:   Reatha Harps, MD     Other Instructions Please try to avoid these triggers: Do not use any products that have nicotine or tobacco in them. These include cigarettes, e-cigarettes, and chewing tobacco. If you need help quitting, ask your doctor. Eat heart-healthy foods. Talk with your doctor about the right eating plan for you. Exercise regularly as told by your doctor. Stay hydrated Do not drink alcohol, Caffeine or chocolate. Lose weight if you are overweight. Do not use drugs, including cannabis

## 2023-05-20 NOTE — Progress Notes (Signed)
[facility] [address], [place] [address]   Clinic Day:  05/21/2023  Referring physician: Benita Stabile, MD  Patient Care Team: Benita Stabile, MD as PCP - General (Internal Medicine) O'Neal, Ronnald Ramp, MD as PCP - Cardiology (Cardiology) Doreatha Massed, MD as Medical Oncologist (Medical Oncology) Therese Sarah, RN as Oncology Nurse Navigator (Medical Oncology)   ASSESSMENT & PLAN:   Assessment:  1.  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  Mildly enlarged/prominent mediastinal lymph nodes stable. - Left upper lobe wedge resection and lymph node dissection by Dr. Dorris Fetch on 04/23/2023 - Pathology: Adenocarcinoma, acinar (80%): Papillary (10%), solid (5%), lipidic (5%) patterns with focal extracellular mucin, poorly differentiated, tumor size 2.8 cm, carcinoma invades visceral pleura.  Spread through airspaces identified.  Lymphovascular invasion not seen.  0/16 lymph nodes involved at lymph node stations 4L, 5, 7, 8, 9, 10, 11, 12, 13.  pT2 pN0.  2. Social/Family History: -Lives at home with husband. Independent of ADL's and IADL's prior to lobectomy. 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 Quit tobacco use on 04/22/23 of 1 ppd since age 8. No chemical exposures. -Paternal grandfather and paternal uncle had cancer, type unknown.   Plan:  1.  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 - Patient has borderline performance status and still recovering from surgery. - I have discussed the role of adjuvant chemotherapy in patients with stage Ib cancer with high risk features including poorly differentiation/visceral pleural invasion. - At this time I do not believe she is a good candidate for chemotherapy.  We also discussed role of EGFR inhibitor osimertinib if  she carries EGFR mutation. - We will order NGS testing. - I have also recommended imaging of the chest as it has been close to 4-5 months since last PET scan.  Recommend a brain MRI to complete workup. - RTC post above.   Orders Placed This Encounter  Procedures   CT Chest W Contrast    Standing Status:   Future    Standing Expiration Date:   05/20/2024    Order Specific Question:   If indicated for the ordered procedure, I authorize the administration of contrast media per Radiology protocol    Answer:   Yes    Order Specific Question:   Does the patient have a contrast media/X-ray dye allergy?    Answer:   No    Order Specific Question:   Preferred imaging location?    Answer:   [facility]    Order Specific Question:   Release to patient    Answer:   Immediate [1]   MR Brain W Wo Contrast    Standing Status:   Future    Standing Expiration Date:   05/20/2024    Order Specific Question:   If indicated for the ordered procedure, I authorize the administration of contrast media per Radiology protocol    Answer:   Yes    Order Specific Question:   What is the patient's sedation requirement?    Answer:   No Sedation    Order Specific Question:   Does the patient have a pacemaker or implanted devices?    Answer:   No    Order Specific Question:   Use SRS Protocol?    Answer:  No    Order Specific Question:   Preferred imaging location?    Answer:   [facility] (table limit [phone])    Order Specific Question:   Release to patient    Answer:   Immediate      I,Helena R Teague,acting as a scribe for Doreatha Massed, MD.,have documented all relevant documentation on the behalf of Doreatha Massed, MD,as directed by  Doreatha Massed, MD while in the presence of Doreatha Massed, MD.   I, Doreatha Massed MD, have reviewed the above documentation for accuracy and completeness, and I agree with the above.   Doreatha Massed, MD    10/17/20245:35 PM  CHIEF COMPLAINT/PURPOSE OF CONSULT:   Diagnosis: Left lung adenocarcinoma   Cancer Staging  Adenocarcinoma of upper lobe of left lung (HCC) Staging form: Lung, AJCC 8th Edition - Clinical stage from 05/21/2023: cT2, cN0, cM0 - Unsigned    Prior Therapy: Left upper lobe wedge resection and lymph node dissection  Current Therapy: Under workup   HISTORY OF PRESENT ILLNESS:   Oncology History   No history exists.      Courtney Gill is a 77 y.o. female presenting to clinic today for evaluation of adenocarcinoma of left upper lobe at the request of Dr. Dorris Fetch.  Patient had a CT lung cancer screening on 12/20/22 that found: a 19.9 mm irregular nodule in the anterior left upper lobe, suspicious for primary bronchogenic carcinoma. She then had an initial PET on 02/19/23 that found: hypermetabolic irregular nodule in the anterior left upper lobe measures 2.1 x 1.6 cm, highly suspicious for primary bronchogenic carcinoma; mildly enlarged/prominent mediastinal lymph nodes are stable from chest CT May 2018; and no evidence of FDG avid metastatic disease in the neck, abdomen, or pelvis.   She then underwent an endobronchial [place] followed by a left upper lobectomy on 04/23/23 with Dr. Dorris Fetch. Surgical pathology of the lymph node aspirations were negative. The resection of the nodule on the left upper lobe of the lung revealed: poorly differentiated adenocarcinoma with focal extracellular mucin. Post-op, she developed A-fib and went home on amiodarone and Eliquis. She presented to the ED on 05/03/23 for drainage of her wound from the partial lobectomy. I independently viewed and interpreted Chest X-ray that found subtle diffuse interstitial opacity left lung status post left upper lobectomy consistent with edema or possible infection. However, she was asymptomatic and discharged.   Today, she states that she is doing well overall. Her appetite level is at 50%. Her energy level is at  25%.  She states she vomited all of her medication this morning and has had multiple episodes of this since lobectomy on 9/19. She was able to keep down Zofran this morning. She notes she has also had difficulty keeping down medications containing sulfur in the past. Since lobectomy, her energy levels have gradually improved. She has a follow-up with Dr. Dorris Fetch on 06/16/23. After her last visit with Dr. Dorris Fetch, she had a fall to her right side that reportedly caused bruising and minor cuts from the right leg to the right upper flank and right forearm. Bruising and cuts have mostly resolved, though she still reports mild soreness on her right ankle and right wrist.   PAST MEDICAL HISTORY:   Past Medical History: Past Medical History:  Diagnosis Date   Angioedema 02/2013   Arthritis    Dyspnea    with exertion   GERD (gastroesophageal reflux disease)    Hypercholesterolemia    Hypertension    pt denies  this (04/21/23)   Left upper lobe pulmonary nodule    Lung cancer (HCC)    Migraine    Moderate aortic stenosis    Mean gradient 21.5 mmHg, AVA 1.24 cm by echo 03/2023   Peripheral artery disease [facility])     Surgical History: Past Surgical History:  Procedure Laterality Date   CAROTID STENT Right 08/04/2005   DILATION AND CURETTAGE OF UTERUS     EYE SURGERY     "muscle moved"   INTERCOSTAL NERVE BLOCK  04/23/2023   Procedure: INTERCOSTAL NERVE BLOCK;  Surgeon: Loreli Slot, MD;  Location: [facility] OR;  Service: Thoracic;;   LYMPH NODE BIOPSY Left 04/23/2023   Procedure: LYMPH NODE BIOPSY;  Surgeon: Loreli Slot, MD;  Location: [facility] OR;  Service: Thoracic;  Laterality: Left;   TONSILLECTOMY     in early 20's   VIDEO BRONCHOSCOPY WITH ENDOBRONCHIAL ULTRASOUND N/A 04/23/2023   Procedure: VIDEO BRONCHOSCOPY WITH ENDOBRONCHIAL ULTRASOUND;  Surgeon: Loreli Slot, MD;  Location: [facility] OR;  Service: Thoracic;  Laterality: N/A;    Social History: Social History    Socioeconomic History   Marital status: Married    Spouse name: Not on file   Number of children: 1   Years of education: Not on file   Highest education level: Not on file  Occupational History   Not on file  Tobacco Use   Smoking status: Former    Types: Cigarettes    Start date: 04/22/2023    Quit date: 04/22/1963    Years since quitting: 60.1   Smokeless tobacco: Never  Vaping Use   Vaping status: Never Used  Substance and Sexual Activity   Alcohol use: Not Currently   Drug use: No   Sexual activity: Not on file  Other Topics Concern   Not on file  Social History Narrative   Lives home with husband, drinks caffeine 2 cups daily.  Education HS.  Children 1 son, 4 stepchildren.   Social Determinants of Health   Financial Resource Strain: Not on file  Food Insecurity: No Food Insecurity (04/23/2023)   Hunger Vital Sign    Worried About Running Out of Food in the Last Year: Never true    Ran Out of Food in the Last Year: Never true  Transportation Needs: No Transportation Needs (04/23/2023)   PRAPARE - Administrator, Civil Service (Medical): No    Lack of Transportation (Non-Medical): No  Physical Activity: Not on file  Stress: Not on file  Social Connections: Not on file  Intimate Partner Violence: Not At Risk (04/23/2023)   Humiliation, Afraid, Rape, and Kick questionnaire    Fear of Current or Ex-Partner: No    Emotionally Abused: No    Physically Abused: No    Sexually Abused: No    Family History: Family History  Problem Relation Age of Onset   Atrial fibrillation Mother    Diabetes Maternal Grandfather    Cancer Paternal Grandfather     Current Medications:  Current Outpatient Medications:    albuterol (VENTOLIN HFA) 108 (90 Base) MCG/ACT inhaler, Inhale 2 puffs into the lungs every 4 (four) hours as needed., Disp: , Rfl:    amiodarone (PACERONE) 200 MG tablet, Take 2 tablets (400 mg total) by mouth 2 (two) times daily. For one week;then  take 200 mg daily thereafter, Disp: 60 tablet, Rfl: 1   apixaban (ELIQUIS) 5 MG TABS tablet, Take 1 tablet (5 mg total) by mouth 2 (two) times daily., Disp:  60 tablet, Rfl: 1   cetirizine (ZYRTEC) 10 MG tablet, Take 10 mg by mouth at bedtime., Disp: , Rfl:    Cholecalciferol (VITAMIN D PO), Take 1 tablet by mouth in the morning., Disp: , Rfl:    clopidogrel (PLAVIX) 75 MG tablet, Take 1 tablet (75 mg total) by mouth daily with breakfast., Disp: 30 tablet, Rfl: 3   Cyanocobalamin (VITAMIN B-12 PO), Take 1 tablet by mouth in the morning., Disp: , Rfl:    diphenhydrAMINE (BENADRYL) 25 MG tablet, Take 25 mg by mouth at bedtime., Disp: , Rfl:    EPINEPHrine 0.3 mg/0.3 mL IJ SOAJ injection, Inject 0.3 mg into the muscle as needed., Disp: 2 each, Rfl: 1   gabapentin (NEURONTIN) 300 MG capsule, Take 1 capsule (300 mg total) by mouth 2 (two) times daily., Disp: 60 capsule, Rfl: 0   guaiFENesin (MUCINEX) 600 MG 12 hr tablet, Take 1 tablet (600 mg total) by mouth 2 (two) times daily., Disp: 30 tablet, Rfl: 0   ibuprofen (ADVIL) 200 MG tablet, Take 400 mg by mouth every 8 (eight) hours as needed (pain)., Disp: , Rfl:    MAGNESIUM PO, Take 1 capsule by mouth in the morning and at bedtime., Disp: , Rfl:    metoprolol tartrate (LOPRESSOR) 25 MG tablet, Take 0.5 tablets (12.5 mg total) by mouth 2 (two) times daily., Disp: 60 tablet, Rfl: 1   Omega-3 Fatty Acids (OMEGA-3 FISH OIL PO), Take 1 capsule by mouth in the morning., Disp: , Rfl:    ondansetron (ZOFRAN-ODT) 8 MG disintegrating tablet, Take 1 tablet (8 mg total) by mouth every 8 (eight) hours as needed for nausea or vomiting., Disp: 20 tablet, Rfl: 0   pantoprazole (PROTONIX) 40 MG tablet, Take 40 mg by mouth daily before breakfast., Disp: , Rfl:    Polyethyl Glycol-Propyl Glycol (SYSTANE) 0.4-0.3 % GEL ophthalmic gel, Place 1 Application into both eyes at bedtime as needed (dry/irritated eyes.)., Disp: , Rfl:    rosuvastatin (CRESTOR) 40 MG tablet, Take 40  mg by mouth at bedtime., Disp: , Rfl:    vitamin E 400 UNIT capsule, Take 400 Units by mouth in the morning., Disp: , Rfl:   Current Facility-Administered Medications:    ondansetron (ZOFRAN-ODT) disintegrating tablet 8 mg, 8 mg, Oral, Q8H PRN,    Allergies: Allergies  Allergen Reactions   Bee Venom Anaphylaxis   Wasp Venom Anaphylaxis   Aspirin Swelling    REVIEW OF SYSTEMS:   Review of Systems  Constitutional:  Negative for chills, fatigue and fever.  HENT:   Positive for trouble swallowing. Negative for lump/mass, mouth sores, nosebleeds and sore throat.   Eyes:  Negative for eye problems.  Respiratory:  Negative for cough and shortness of breath.   Cardiovascular:  Negative for chest pain, leg swelling and palpitations.  Gastrointestinal:  Positive for constipation, nausea and vomiting. Negative for abdominal pain and diarrhea.  Genitourinary:  Negative for bladder incontinence, difficulty urinating, dysuria, frequency, hematuria and nocturia.   Musculoskeletal:  Negative for arthralgias, back pain, flank pain, myalgias and neck pain.        a+pain inferior to right breast, 8/10 severity +right ankle and wrist soreness   Skin:  Negative for itching and rash.  Neurological:  Negative for dizziness, headaches and numbness.  Hematological:  Does not bruise/bleed easily.  Psychiatric/Behavioral:  Positive for sleep disturbance. Negative for depression and suicidal ideas. The patient is not nervous/anxious.   All other systems reviewed and are negative.    VITALS:  Blood pressure (!) 153/67, pulse (!) 49, temperature (!) 97.4 F (36.3 C), temperature source Oral, resp. rate 16, height 5\' 3"  (1.6 m), weight 156 lb 3.2 oz (70.9 kg), SpO2 98%.  Wt Readings from Last 3 Encounters:  05/21/23 156 lb 3.2 oz (70.9 kg)  05/19/23 155 lb 9.6 oz (70.6 kg)  05/12/23 157 lb (71.2 kg)    Body mass index is 27.67 kg/m.  Performance status (ECOG): 1 - Symptomatic but completely  ambulatory  PHYSICAL EXAM:   Physical Exam Vitals and nursing note reviewed. Exam conducted with a chaperone present.  Constitutional:      Appearance: Normal appearance.  Cardiovascular:     Rate and Rhythm: Normal rate and regular rhythm.     Pulses: Normal pulses.     Heart sounds: Murmur (systolic) heard.  Pulmonary:     Effort: Pulmonary effort is normal.     Breath sounds: Normal breath sounds.  Abdominal:     Palpations: Abdomen is soft. There is no hepatomegaly, splenomegaly or mass.     Tenderness: There is no abdominal tenderness.  Musculoskeletal:     Right lower leg: No edema.     Left lower leg: No edema.  Lymphadenopathy:     Cervical: No cervical adenopathy.     Right cervical: No superficial, deep or posterior cervical adenopathy.    Left cervical: No superficial, deep or posterior cervical adenopathy.     Upper Body:     Right upper body: No supraclavicular or axillary adenopathy.     Left upper body: No supraclavicular or axillary adenopathy.  Neurological:     General: No focal deficit present.     Mental Status: She is alert and oriented to person, place, and time.  Psychiatric:        Mood and Affect: Mood normal.        Behavior: Behavior normal.     LABS:      Latest Ref Rng & Units 05/03/2023   12:29 PM 04/25/2023    2:36 AM 04/24/2023    8:48 AM  CBC  WBC 4.0 - 10.5 K/uL 10.4  10.5  11.3   Hemoglobin 12.0 - 15.0 g/dL 62.1  30.8  65.7   Hematocrit 36.0 - 46.0 % 41.4  35.9  37.1   Platelets 150 - 400 K/uL 343  188  187       Latest Ref Rng & Units 05/03/2023   12:29 PM 04/26/2023    2:34 AM 04/25/2023    2:36 AM  CMP  Glucose 70 - 99 mg/dL 846  962  952   BUN 8 - 23 mg/dL 17  15  18    Creatinine 0.44 - 1.00 mg/dL 8.41  3.24  4.01   Sodium 135 - 145 mmol/L 137  137  138   Potassium 3.5 - 5.1 mmol/L 3.7  4.2  4.0   Chloride 98 - 111 mmol/L 101  104  107   CO2 22 - 32 mmol/L 23  25  26    Calcium 8.9 - 10.3 mg/dL 9.2  9.0  8.5   Total  Protein 6.5 - 8.1 g/dL 6.7   5.6   Total Bilirubin 0.3 - 1.2 mg/dL 0.6   0.7   Alkaline Phos 38 - 126 U/L 71   49   AST 15 - 41 U/L 21   20   ALT 0 - 44 U/L 22   10      No results found for: "CEA1", "CEA" /  No results found for: "CEA1", "CEA" No results found for: "PSA1" No results found for: "WUJ811" No results found for: "CAN125"  No results found for: "TOTALPROTELP", "ALBUMINELP", "A1GS", "A2GS", "BETS", "BETA2SER", "GAMS", "MSPIKE", "SPEI" No results found for: "TIBC", "FERRITIN", "IRONPCTSAT" No results found for: "LDH"   STUDIES:   VAS [place] LOWER EXTREMITY VENOUS (DVT)  Result Date: 05/13/2023  Lower Venous DVT Study Patient Name:  Courtney Gill  Date of Exam:   05/12/2023 Medical Rec #: [id number]     Accession #:    [id number] Date of Birth: 10-29-1945      Patient Gender: F Patient Age:   64 years Exam Location:  [facility] Procedure:      VAS [place] LOWER EXTREMITY VENOUS (DVT) Referring Phys: Viviann Spare HENDRICKSON --------------------------------------------------------------------------------  Other Indications: Patient reports pain in the bilateral legs, it's hard for her                    to describe what the pain feels like. No swelling, chest pain                    or SOB at this time. Anticoagulation: Eliquis. Performing Technologist: Olegario Shearer RVT  Examination Guidelines: A complete evaluation includes B-mode imaging, spectral Doppler, color Doppler, and power Doppler as needed of all accessible portions of each vessel. Bilateral testing is considered an integral part of a complete examination. Limited examinations for reoccurring indications may be performed as noted. The reflux portion of the exam is performed with the patient in reverse Trendelenburg.  +---------+---------------+---------+-----------+----------+--------------+ RIGHT    CompressibilityPhasicitySpontaneityPropertiesThrombus Aging +---------+---------------+---------+-----------+----------+--------------+  CFV      Full           Yes      Yes                                 +---------+---------------+---------+-----------+----------+--------------+ SFJ      Full           Yes      Yes                                 +---------+---------------+---------+-----------+----------+--------------+ FV Prox  Full           Yes      Yes                                 +---------+---------------+---------+-----------+----------+--------------+ FV Mid   Full           Yes      Yes                                 +---------+---------------+---------+-----------+----------+--------------+ FV DistalFull           Yes      Yes                                 +---------+---------------+---------+-----------+----------+--------------+ PFV      Full                                                        +---------+---------------+---------+-----------+----------+--------------+  POP      Full           Yes      Yes                                 +---------+---------------+---------+-----------+----------+--------------+ PTV      Full           Yes      Yes                                 +---------+---------------+---------+-----------+----------+--------------+ PERO     Full           Yes      Yes                                 +---------+---------------+---------+-----------+----------+--------------+ Gastroc  Full                                                        +---------+---------------+---------+-----------+----------+--------------+ GSV      Full           Yes      Yes                                 +---------+---------------+---------+-----------+----------+--------------+   +---------+---------------+---------+-----------+----------+--------------+ LEFT     CompressibilityPhasicitySpontaneityPropertiesThrombus Aging +---------+---------------+---------+-----------+----------+--------------+ CFV      Full           Yes      Yes                                  +---------+---------------+---------+-----------+----------+--------------+ SFJ      Full           Yes      Yes                                 +---------+---------------+---------+-----------+----------+--------------+ FV Prox  Full           Yes      Yes                                 +---------+---------------+---------+-----------+----------+--------------+ FV Mid   Full           Yes      Yes                                 +---------+---------------+---------+-----------+----------+--------------+ FV DistalFull           Yes      Yes                                 +---------+---------------+---------+-----------+----------+--------------+ PFV      Full                                                        +---------+---------------+---------+-----------+----------+--------------+  POP      Full           Yes      Yes                                 +---------+---------------+---------+-----------+----------+--------------+ PTV      Full           Yes      Yes                                 +---------+---------------+---------+-----------+----------+--------------+ PERO     Full           Yes      Yes                                 +---------+---------------+---------+-----------+----------+--------------+ GSV      Full           Yes      Yes                                 +---------+---------------+---------+-----------+----------+--------------+     Summary: RIGHT: - No evidence of deep vein thrombosis in the lower extremity. No indirect evidence of obstruction proximal to the inguinal ligament.  - No cystic structure found in the popliteal fossa.  LEFT: - No evidence of deep vein thrombosis in the lower extremity. No indirect evidence of obstruction proximal to the inguinal ligament.  - No cystic structure found in the popliteal fossa.  *See table(s) above for measurements and observations. Electronically signed  by Lemar Livings MD on 05/13/2023 at 5:26:25 PM.    Final    DG Chest 2 View  Result Date: 05/08/2023 CLINICAL DATA:  Wound check after left lung surgery. EXAM: CHEST - 2 VIEW COMPARISON:  May 03, 2023. FINDINGS: The heart size and mediastinal contours are within normal limits. Stable opacity is noted in left upper lobe which may represent postoperative change from previous left upper lobectomy, but some degree of associated atelectasis or infiltrate cannot be excluded. Small left pleural effusion is noted. Right lung is hyperexpanded but otherwise unremarkable. The visualized skeletal structures are unremarkable. IMPRESSION: Stable opacity seen in left upper lobe which may represent postoperative change from previous left upper lobectomy, but some degree of associated atelectasis or infiltrate may be present. Small left pleural effusion. Electronically Signed   By: Lupita Raider M.D.   On: 05/08/2023 11:08   DG Chest 2 View  Result Date: 05/03/2023 CLINICAL DATA:  Recent lung cancer surgery, nausea, aching EXAM: CHEST - 2 VIEW COMPARISON:  04/28/2023 FINDINGS: The heart size and mediastinal contours are within normal limits. Subtle diffuse interstitial opacity of the left lung status post left upper lobectomy. The right lung is normally aerated. Osseous structures unremarkable. The visualized skeletal structures are unremarkable. IMPRESSION: Subtle diffuse interstitial opacity of the left lung status post left upper lobectomy, consistent with edema or infection. No focal airspace opacity. Electronically Signed   By: Jearld Lesch M.D.   On: 05/03/2023 13:21   DG Chest 2 View  Result Date: 04/28/2023 CLINICAL DATA:  S/P lobectomy of lung EXAM: CHEST - 2 VIEW COMPARISON:  Chest x-ray 04/27/2023. FINDINGS: Mild streaky left no visible pneumothorax. Small amount of subcutaneous gas  along the left chest wall. Basilar opacities. No visible pleural effusions. Cardiomediastinal silhouette is unchanged. No  acute bony abnormality. IMPRESSION: No visible pneumothorax. Small amount of subcutaneous gas along the left chest wall. Mild streaky left basilar opacities, likely atelectasis. Electronically Signed   By: Feliberto Harts M.D.   On: 04/28/2023 09:26   DG CHEST PORT 1 VIEW  Result Date: 04/27/2023 CLINICAL DATA:  Pneumothorax EXAM: PORTABLE CHEST - 1 VIEW COMPARISON:  the previous day's study FINDINGS: Stable left chest tube. No definite pneumothorax. Surgical staples at the left hilum. Stable left lateral subcutaneous emphysema. Coarse perihilar interstitial opacities left greater than right. Heart size and mediastinal contours are within normal limits. Aortic Atherosclerosis (ICD10-170.0). No effusion. IMPRESSION: 1. Stable left chest tube without pneumothorax. 2. Coarse perihilar interstitial opacities left greater than right. Electronically Signed   By: Corlis Leak M.D.   On: 04/27/2023 07:49   DG CHEST PORT 1 VIEW  Result Date: 04/26/2023 CLINICAL DATA:  77 year old female with history of pneumothorax. EXAM: PORTABLE CHEST 1 VIEW COMPARISON:  Chest x-ray 04/25/2023. FINDINGS: Postoperative changes of left upper lobectomy are noted. Left-sided chest tube in position with tip extending into the apex of the left hemithorax. Trace pneumothorax most evident in the medial aspect of the left base. Small amount of subcutaneous emphysema along the left chest wall incidentally noted. Opacity at the left base favored to reflect postoperative subsegmental atelectasis. Right lung appears clear. No pleural effusions. No evidence of pulmonary edema. Heart size is normal. Upper mediastinal contours are within normal limits. Atherosclerotic calcifications in the thoracic aorta. IMPRESSION: 1. Postoperative changes and support apparatus, as above. Stable position of left-sided chest tube with trace left basilar pneumothorax. 2. Aortic atherosclerosis. Electronically Signed   By: Trudie Reed M.D.   On: 04/26/2023  08:43   DG Chest 1 View  Result Date: 04/25/2023 CLINICAL DATA:  Postop follow-up EXAM: CHEST  1 VIEW COMPARISON:  Yesterday FINDINGS: Small left pneumothorax with chest tube in place, tip at the apex. Chain sutures overlap the left lung. Generalized interstitial coarsening is stable and bronchitic. Retrocardiac atelectasis. Stable heart size and mediastinal contours. IMPRESSION: 1. Left chest tube with small pneumothorax. 2. Mild increase in retrocardiac atelectasis. Electronically Signed   By: Tiburcio Pea M.D.   On: 04/25/2023 08:39   DG Chest Port 1 View  Result Date: 04/24/2023 CLINICAL DATA:  Status post lobectomy of lung. EXAM: PORTABLE CHEST 1 VIEW COMPARISON:  Radiograph yesterday FINDINGS: Postsurgical change in the left hemithorax with volume loss and chain sutures. Left-sided chest tube directed towards the apex. Trivial left apical pneumothorax. Small amount of subcutaneous emphysema in the left chest wall. Stable heart size and mediastinal contours. No focal right lung abnormality. No large pleural effusion IMPRESSION: Postsurgical change in the left hemithorax. Trace apical pneumothorax with chest tube in place. Electronically Signed   By: Narda Rutherford M.D.   On: 04/24/2023 08:46   DG Chest Port 1 View  Result Date: 04/23/2023 CLINICAL DATA:  Status post lobectomy of lung. Left upper lobe wedge resection. EXAM: PORTABLE CHEST 1 VIEW COMPARISON:  Preoperative exam 04/21/2023 FINDINGS: Postsurgical change in the left hemithorax with chain sutures at the apex and volume loss. Left-sided chest tube is directed towards the apex. No definite pneumothorax. Small amount of subcutaneous emphysema seen on the left chest wall. Chronic bronchial thickening. Normal heart size for technique with stable mediastinal contours. No large pleural effusion IMPRESSION: Postsurgical change in the left hemithorax with volume loss. Left-sided  chest tube in place. No definite pneumothorax. Electronically  Signed   By: Narda Rutherford M.D.   On: 04/23/2023 19:36

## 2023-05-21 ENCOUNTER — Inpatient Hospital Stay: Payer: Medicare HMO

## 2023-05-21 ENCOUNTER — Inpatient Hospital Stay: Payer: Medicare HMO | Attending: Hematology | Admitting: Hematology

## 2023-05-21 ENCOUNTER — Encounter: Payer: Self-pay | Admitting: Hematology

## 2023-05-21 VITALS — BP 153/67 | HR 49 | Temp 97.4°F | Resp 16 | Ht 63.0 in | Wt 156.2 lb

## 2023-05-21 DIAGNOSIS — C3412 Malignant neoplasm of upper lobe, left bronchus or lung: Secondary | ICD-10-CM | POA: Diagnosis not present

## 2023-05-21 DIAGNOSIS — Z7901 Long term (current) use of anticoagulants: Secondary | ICD-10-CM | POA: Diagnosis not present

## 2023-05-21 DIAGNOSIS — Z87891 Personal history of nicotine dependence: Secondary | ICD-10-CM | POA: Diagnosis not present

## 2023-05-21 DIAGNOSIS — Z79899 Other long term (current) drug therapy: Secondary | ICD-10-CM | POA: Insufficient documentation

## 2023-05-21 DIAGNOSIS — J90 Pleural effusion, not elsewhere classified: Secondary | ICD-10-CM | POA: Diagnosis not present

## 2023-05-21 DIAGNOSIS — Z809 Family history of malignant neoplasm, unspecified: Secondary | ICD-10-CM | POA: Insufficient documentation

## 2023-05-21 DIAGNOSIS — I4891 Unspecified atrial fibrillation: Secondary | ICD-10-CM | POA: Diagnosis not present

## 2023-05-21 DIAGNOSIS — Z902 Acquired absence of lung [part of]: Secondary | ICD-10-CM | POA: Insufficient documentation

## 2023-05-21 NOTE — Patient Instructions (Addendum)
[facility]  Discharge Instructions  You were seen and examined today by Dr. Ellin Saba. Dr. Ellin Saba is a medical oncologist, meaning that he specializes in the treatment of cancer diagnoses. Dr. Ellin Saba discussed your past medical history, family history of cancers, and the events that led to you being here today.  You were referred to Dr. Ellin Saba due to a new diagnosis of lung cancer.  Your lung cancer was removed during surgery; however, your cancer had aggressive features which would typically warrant chemotherapy treatments. You may not be able to tolerate the chemotherapy easily. Should you chose not to undergo chemotherapy treatments, we will continue to monitor you routinely to ensure there is no return of the cancer.  At this time, Dr. Ellin Saba has ordered a brain MRI and CT scan of your chest.  Follow-up as scheduled.  Thank you for choosing [facility] to provide your oncology and hematology care.   To afford each patient quality time with our provider, please arrive at least 15 minutes before your scheduled appointment time. You may need to reschedule your appointment if you arrive late (10 or more minutes). Arriving late affects you and other patients whose appointments are after yours.  Also, if you miss three or more appointments without notifying the office, you may be dismissed from the clinic at the provider's discretion.    Again, thank you for choosing [facility].  Our hope is that these requests will decrease the amount of time that you wait before being seen by our physicians.   If you have a lab appointment with the Cancer Center - please note that after April 8th, all labs will be drawn in the cancer center.  You do not have to check in or register with the main entrance as you have in the past but will complete your check-in at the cancer center.             _____________________________________________________________  Should you have questions after your visit to [facility], please contact our office at [phone] and follow the prompts.  Our office hours are 8:00 a.m. to 4:30 p.m. Monday - Thursday and 8:00 a.m. to 2:30 p.m. Friday.  Please note that voicemails left after 4:00 p.m. may not be returned until the following business day.  We are closed weekends and all major holidays.  You do have access to a nurse 24-7, just call the main number to the clinic [phone] and do not press any options, hold on the line and a nurse will answer the phone.    For prescription refill requests, have your pharmacy contact our office and allow 72 hours.    Masks are no longer required in the cancer centers. If you would like for your care team to wear a mask while they are taking care of you, please let them know. You may have one support person who is at least 75 years old accompany you for your appointments.

## 2023-05-25 DIAGNOSIS — I4891 Unspecified atrial fibrillation: Secondary | ICD-10-CM | POA: Diagnosis not present

## 2023-05-26 ENCOUNTER — Telehealth: Payer: Self-pay

## 2023-05-26 NOTE — Telephone Encounter (Signed)
-----   Message from Loreli Slot sent at 05/22/2023  4:32 PM EDT ----- Regarding: RE: Gabapentin refill Thanks ----- Message ----- From: Steve Rattler, RN Sent: 05/22/2023   3:47 PM EDT To: Loreli Slot, MD Subject: Gabapentin refill                              Hey,  She is requesting a refill of Gabapentin. It was refilled until follow up with you in one month. Just Lorain Childes,  Thanks, [employer]

## 2023-05-27 ENCOUNTER — Telehealth: Payer: Self-pay | Admitting: General Practice

## 2023-05-27 NOTE — Telephone Encounter (Signed)
Pt wants to know if she needs to take her heart monitor off or keep it on for her MRI tomorrow

## 2023-05-28 ENCOUNTER — Ambulatory Visit (HOSPITAL_COMMUNITY)
Admission: RE | Admit: 2023-05-28 | Discharge: 2023-05-28 | Disposition: A | Discharge status: Home / Self Care | Payer: Medicare HMO | Source: Ambulatory Visit | Attending: Hematology | Admitting: Hematology

## 2023-05-28 DIAGNOSIS — C3410 Malignant neoplasm of upper lobe, unspecified bronchus or lung: Secondary | ICD-10-CM | POA: Diagnosis not present

## 2023-05-28 DIAGNOSIS — C3412 Malignant neoplasm of upper lobe, left bronchus or lung: Secondary | ICD-10-CM | POA: Diagnosis not present

## 2023-05-28 DIAGNOSIS — I6782 Cerebral ischemia: Secondary | ICD-10-CM | POA: Diagnosis not present

## 2023-05-28 MED ORDER — GADOBUTROL 1 MMOL/ML IV SOLN
7.0000 mL | Freq: Once | INTRAVENOUS | Status: AC | PRN
  Administered 2023-05-28: 7 mL via INTRAVENOUS

## 2023-05-28 NOTE — Telephone Encounter (Signed)
Sent message to monitor tech for advise. Will follow up

## 2023-05-28 NOTE — Telephone Encounter (Signed)
Spoke with pt and per Andee Lineman, RN pt is able to pause/remove monitor during her MRI and then reapply/resume after exam. Pt verbalized understanding. No further questions at this time.

## 2023-05-29 ENCOUNTER — Ambulatory Visit (HOSPITAL_COMMUNITY): Payer: Medicare HMO

## 2023-05-30 ENCOUNTER — Telehealth: Payer: Self-pay | Admitting: Student

## 2023-05-30 NOTE — Telephone Encounter (Addendum)
Cardiac Monitor Alert  Date of alert:  05/30/2023   Patient Name: Courtney Gill  DOB: 08/28/1945  MRN: [id number]   [facility] Cardiologist: Reatha Harps, MD   Monitor Information: Cardiac Event Monitor [[employer]]  Reason: Atrial Fibrillation Ordering provider: Edd Fabian, NP :1}  Alert Atrial Fibrillation/Flutter This is the 1st alert for this rhythm.  The patient has a hx of Atrial Fibrillation/Flutter.  The patient is currently on anticoagulation.  Anticoagulation medication as of 05/30/2023           apixaban (ELIQUIS) 5 MG TABS tablet Take 1 tablet (5 mg total) by mouth 2 (two) times daily.       Monitor was ordered to assess for recurrence of post-operative atrial fibrillation. Was notified by the monitoring company she had 45 seconds of atrial fibrillation this morning with return to NSR. Strips will be faxed to the office for review. She is already on Amiodarone, Eliquis and Lopressor. Will route to the ordering provider as an FYI.   Signed, Ellsworth Lennox, PA-C 05/30/2023, 8:00 AM Pager: [phone]

## 2023-06-01 NOTE — Telephone Encounter (Signed)
Alert came in this morning. The alert was from Saturday 10/26 which has already been addressed by on call. She forwarded to provider.   I did call patient who was asleep at the time of alert.

## 2023-06-02 ENCOUNTER — Telehealth: Payer: Self-pay

## 2023-06-02 NOTE — Telephone Encounter (Signed)
Patient contacted the office stating that she has been experiencing nausea and vomiting since her MRI. She states that Thursday she started to feel unwell. She was unsure if the testing which had contrast in it was possibly the cause. She states that she even cancelled an appointment with her PCP and recent scans due to symptoms she has been experiencing. Advised that she should contact her PCP's office back and request another appointment for evaluation. She states she does have Zofran which she didn't want to take a bunch of due to the price of the medication. Advised that it would be best for her to take it when needed. She acknowledged receipt.

## 2023-06-05 ENCOUNTER — Ambulatory Visit (HOSPITAL_COMMUNITY)
Admission: RE | Admit: 2023-06-05 | Discharge: 2023-06-05 | Disposition: A | Discharge status: Home / Self Care | Payer: Medicare HMO | Source: Ambulatory Visit | Attending: Hematology | Admitting: Hematology

## 2023-06-05 DIAGNOSIS — C3490 Malignant neoplasm of unspecified part of unspecified bronchus or lung: Secondary | ICD-10-CM | POA: Diagnosis not present

## 2023-06-05 DIAGNOSIS — S2241XA Multiple fractures of ribs, right side, initial encounter for closed fracture: Secondary | ICD-10-CM | POA: Diagnosis not present

## 2023-06-05 DIAGNOSIS — C3412 Malignant neoplasm of upper lobe, left bronchus or lung: Secondary | ICD-10-CM | POA: Insufficient documentation

## 2023-06-05 DIAGNOSIS — I700 Atherosclerosis of aorta: Secondary | ICD-10-CM | POA: Diagnosis not present

## 2023-06-05 DIAGNOSIS — J439 Emphysema, unspecified: Secondary | ICD-10-CM | POA: Diagnosis not present

## 2023-06-05 LAB — POCT I-STAT CREATININE: Creatinine, Ser: 1.6 mg/dL — ABNORMAL HIGH (ref 0.44–1.00)

## 2023-06-05 MED ORDER — IOHEXOL 350 MG/ML SOLN
50.0000 mL | Freq: Once | INTRAVENOUS | Status: AC | PRN
  Administered 2023-06-05: 50 mL via INTRAVENOUS

## 2023-06-11 ENCOUNTER — Other Ambulatory Visit: Payer: Self-pay | Admitting: Surgical

## 2023-06-11 ENCOUNTER — Other Ambulatory Visit: Payer: Self-pay | Admitting: General Practice

## 2023-06-11 NOTE — Telephone Encounter (Signed)
Prescription refill request for Eliquis received. Indication: AF Last office visit: 05/19/23  Sherlean Foot NP Scr: 1.60 on 06/05/23  Epic Age: 77 Weight: 70.6kg  Based on above findings Eliquis 5mg  twice daily is the appropriate dose.  Refill approved.

## 2023-06-15 ENCOUNTER — Other Ambulatory Visit: Payer: Self-pay | Admitting: Thoracic Surgery (Cardiothoracic Vascular Surgery)

## 2023-06-15 DIAGNOSIS — C3492 Malignant neoplasm of unspecified part of left bronchus or lung: Secondary | ICD-10-CM

## 2023-06-16 ENCOUNTER — Ambulatory Visit
Admission: RE | Admit: 2023-06-16 | Discharge: 2023-06-16 | Disposition: A | Discharge status: Home / Self Care | Payer: Medicare HMO | Source: Ambulatory Visit | Attending: Thoracic Surgery (Cardiothoracic Vascular Surgery) | Admitting: Thoracic Surgery (Cardiothoracic Vascular Surgery)

## 2023-06-16 ENCOUNTER — Ambulatory Visit (INDEPENDENT_AMBULATORY_CARE_PROVIDER_SITE_OTHER): Payer: Self-pay | Admitting: Thoracic Surgery (Cardiothoracic Vascular Surgery)

## 2023-06-16 VITALS — BP 108/66 | HR 59 | Resp 18 | Ht 63.0 in | Wt 152.0 lb

## 2023-06-16 DIAGNOSIS — C3492 Malignant neoplasm of unspecified part of left bronchus or lung: Secondary | ICD-10-CM

## 2023-06-16 DIAGNOSIS — Z902 Acquired absence of lung [part of]: Secondary | ICD-10-CM | POA: Diagnosis not present

## 2023-06-16 DIAGNOSIS — Z09 Encounter for follow-up examination after completed treatment for conditions other than malignant neoplasm: Secondary | ICD-10-CM

## 2023-06-16 NOTE — Progress Notes (Signed)
[address]       Courtney Gill [address]             [phone]     HPI: Courtney Gill returns for follow-up after left upper lobectomy for stage Ib non-small cell carcinoma.  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  Found to have a left upper lobe nodule in May 2024.  On PET/CT it was hypermetabolic.  She underwent endobronchial ultrasound and robotic left upper lobectomy on 04/23/2023.  There was visceral pleural invasion so it was a t2a, N0, stage Ib tumor.  Postoperatively she had atrial fibrillation which converted with sinus rhythm but she did go home on Eliquis.  I saw her in the office on 05/12/2023.  She was doing well at that time but currently fell on her way out of the office onto her right chest.  She still has pain at that site.  She saw Dr. Ellin Gill.  He is doing some molecular testing to see if she might be a candidate for osimertinib.  She had an MRI which showed no evidence of metastatic disease.  She says that after the MR she had 10 days of nausea and vomiting and is just now recovering from that.  She really does not have any significant pain on the left side but has pain on the right chest wall.  It does hurt when she takes deep breath.  Past Medical History:  Diagnosis Date   Angioedema 02/2013   Arthritis    Dyspnea    with exertion   GERD (gastroesophageal reflux disease)    Hypercholesterolemia    Hypertension    pt denies this (04/21/23)   Left upper lobe pulmonary nodule    Lung cancer (HCC)    Migraine    Moderate aortic stenosis    Mean gradient 21.5 mmHg, AVA 1.24 cm by echo 03/2023   Peripheral artery disease (HCC)     Current Outpatient Medications  Medication Sig Dispense Refill   albuterol  (VENTOLIN HFA) 108 (90 Base) MCG/ACT inhaler Inhale 2 puffs into the lungs every 4 (four) hours as needed.     amiodarone (PACERONE) 200 MG tablet Take 2 tablets (400 mg total) by mouth 2 (two) times daily. For one week;then take 200 mg daily thereafter 60 tablet 1   apixaban (ELIQUIS) 5 MG TABS tablet Take 1 tablet (5 mg total) by mouth 2 (two) times daily. 60 tablet 3   cetirizine (ZYRTEC) 10 MG tablet Take 10 mg by mouth at bedtime.     Cholecalciferol (VITAMIN D PO) Take 1 tablet by mouth in the morning.     clopidogrel (PLAVIX) 75 MG tablet Take 1 tablet (75 mg total) by mouth daily with breakfast. 30 tablet 3   Cyanocobalamin (VITAMIN B-12 PO) Take 1 tablet by mouth in the morning.     diphenhydrAMINE (BENADRYL) 25 MG tablet Take 25 mg by mouth at bedtime.     EPINEPHrine 0.3 mg/0.3 mL IJ SOAJ injection Inject 0.3 mg into the muscle as needed. 2 each 1   gabapentin (NEURONTIN) 300 MG capsule Take 1 capsule (300 mg total) by mouth 2 (two) times daily. 60 capsule 0   guaiFENesin (MUCINEX) 600 MG 12 hr tablet Take 1 tablet (600 mg total) by mouth  2 (two) times daily. 30 tablet 0   ibuprofen (ADVIL) 200 MG tablet Take 400 mg by mouth every 8 (eight) hours as needed (pain).     MAGNESIUM PO Take 1 capsule by mouth in the morning and at bedtime.     metoprolol tartrate (LOPRESSOR) 25 MG tablet Take 0.5 tablets (12.5 mg total) by mouth 2 (two) times daily. 60 tablet 1   Omega-3 Fatty Acids (OMEGA-3 FISH OIL PO) Take 1 capsule by mouth in the morning.     ondansetron (ZOFRAN-ODT) 8 MG disintegrating tablet Take 1 tablet (8 mg total) by mouth every 8 (eight) hours as needed for nausea or vomiting. 20 tablet 0   pantoprazole (PROTONIX) 40 MG tablet Take 40 mg by mouth daily before breakfast.     Polyethyl Glycol-Propyl Glycol (SYSTANE) 0.4-0.3 % GEL ophthalmic gel Place 1 Application into both eyes at bedtime as needed (dry/irritated eyes.).     rosuvastatin (CRESTOR) 40 MG tablet Take 40 mg by mouth  at bedtime.     vitamin E 400 UNIT capsule Take 400 Units by mouth in the morning.     Current Facility-Administered Medications  Medication Dose Route Frequency Provider Last Rate Last Admin   ondansetron (ZOFRAN-ODT) disintegrating tablet 8 mg  8 mg Oral Q8H PRN         Physical Exam BP 108/66   Pulse (!) 59   Resp 18   Ht 5\' 3"  (1.6 m)   Wt 152 lb (68.9 kg)   SpO2 93% Comment: RA  BMI 26.49 kg/m  77 year old woman in no acute distress Alert and oriented x 3 with no focal deficits Lungs diminished at left base but otherwise clear Cardiac regular rate and rhythm with a 2/6 murmur Incisions well-healed  Diagnostic Tests: I personally reviewed her chest x-ray images.  Postoperative changes present.  I reviewed her CT from 06/05/2023.  That was after her fall.  She does have a nondisplaced fracture of the right seventh rib.  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  Stage Ib adenocarcinoma of the left upper lobe-she and her husband had a lot of questions about why it was stage Ib disease.  I explained that to them although not sure they completely understood.  Also explained to her that she carries that diagnosis and that will always be part of her history.  Not considered cured of the disease until 5 years even though she has been resected with an R0 resection.  She does have a nondisplaced right seventh rib fracture.  That should heal without any intervention.  Plan: Follow-up with Dr. Ellin Gill I will plan to see her back in 6 months to check on her progress.  Courtney Slot, MD [facility] [phone]

## 2023-06-17 ENCOUNTER — Inpatient Hospital Stay: Payer: Medicare HMO | Attending: Hematology | Admitting: Hematology

## 2023-06-17 VITALS — BP 111/68 | HR 55 | Temp 97.5°F | Resp 16 | Wt 152.4 lb

## 2023-06-17 DIAGNOSIS — Z85118 Personal history of other malignant neoplasm of bronchus and lung: Secondary | ICD-10-CM | POA: Insufficient documentation

## 2023-06-17 DIAGNOSIS — Z79899 Other long term (current) drug therapy: Secondary | ICD-10-CM | POA: Insufficient documentation

## 2023-06-17 DIAGNOSIS — Z08 Encounter for follow-up examination after completed treatment for malignant neoplasm: Secondary | ICD-10-CM | POA: Diagnosis not present

## 2023-06-17 DIAGNOSIS — Z809 Family history of malignant neoplasm, unspecified: Secondary | ICD-10-CM | POA: Diagnosis not present

## 2023-06-17 DIAGNOSIS — C3412 Malignant neoplasm of upper lobe, left bronchus or lung: Secondary | ICD-10-CM | POA: Diagnosis not present

## 2023-06-17 DIAGNOSIS — Z87891 Personal history of nicotine dependence: Secondary | ICD-10-CM | POA: Insufficient documentation

## 2023-06-17 DIAGNOSIS — Z902 Acquired absence of lung [part of]: Secondary | ICD-10-CM | POA: Diagnosis not present

## 2023-06-17 NOTE — Progress Notes (Signed)
[facility] [address], [place] [address]   Clinic Day:  06/18/2023  Referring physician: Benita Stabile, MD  Patient Care Team: Benita Stabile, MD as PCP - General (Internal Medicine) O'Neal, Ronnald Ramp, MD as PCP - Cardiology (Cardiology) Doreatha Massed, MD as Medical Oncologist (Medical Oncology) Therese Sarah, RN as Oncology Nurse Navigator (Medical Oncology)   ASSESSMENT & PLAN:   Assessment:  1.  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  Mildly enlarged/prominent mediastinal lymph nodes stable. - Left upper lobe wedge resection and lymph node dissection by Dr. Dorris Fetch on 04/23/2023 - Pathology: Adenocarcinoma, acinar (80%): Papillary (10%), solid (5%), lipidic (5%) patterns with focal extracellular mucin, poorly differentiated, tumor size 2.8 cm, carcinoma invades visceral pleura.  Spread through airspaces identified.  Lymphovascular invasion not seen.  0/16 lymph nodes involved at lymph node stations 4L, 5, 7, 8, 9, 10, 11, 12, 13.  pT2 pN0. - NGS: STK 11, T p53, MSI-stable, TMB-low  2. Social/Family History: -Lives at home with husband. Independent of ADL's and IADL's prior to lobectomy. 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 Quit tobacco use on 04/22/23 of 1 ppd since age 23. No chemical exposures. -Paternal grandfather and paternal uncle had cancer, type unknown.   Plan:  1.  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  Energy levels are slowly improving. - We reviewed MRI of the brain from 05/28/2023: No metastatic disease. - We reviewed CT chest from 06/05/2023: Postsurgical changes with no evidence of recurrence.  Stable prominent mediastinal lymph nodes.  Interval multiple anterolateral right rib fractures from recent fall. - As her NGS is negative for EGFR, I did not offer  any adjuvant therapy. - Discussed surveillance plan with CT scan every 6 months. - RTC 6 months with CT chest and labs.   Orders Placed This Encounter  Procedures   CT Chest W Contrast    Standing Status:   Future    Standing Expiration Date:   06/16/2024    Order Specific Question:   If indicated for the ordered procedure, I authorize the administration of contrast media per Radiology protocol    Answer:   Yes    Order Specific Question:   Does the patient have a contrast media/X-ray dye allergy?    Answer:   No    Order Specific Question:   Preferred imaging location?    Answer:   [facility]   CBC with Differential    Standing Status:   Future    Standing Expiration Date:   06/16/2024   Comprehensive metabolic panel    Standing Status:   Future    Standing Expiration Date:   06/16/2024      Mikeal Hawthorne R Teague,acting as a scribe for Doreatha Massed, MD.,have documented all relevant documentation on the behalf of Doreatha Massed, MD,as directed by  Doreatha Massed, MD while in the presence of Doreatha Massed, MD.  I, Doreatha Massed MD, have reviewed the above documentation for accuracy and completeness, and I agree with the above.    Doreatha Massed, MD   11/14/20241:07 PM  CHIEF COMPLAINT/PURPOSE OF CONSULT:   Diagnosis: Left lung adenocarcinoma   Cancer Staging  Adenocarcinoma of upper lobe of left lung [facility]) Staging form: Lung, AJCC 8th Edition - Clinical stage from 05/21/2023: cT2, cN0, cM0 - Unsigned  Prior Therapy: Left upper lobe wedge resection and lymph node dissection  Current Therapy: Under workup   HISTORY OF PRESENT ILLNESS:   Oncology History   No history exists.      Courtney Gill is a 77 y.o. female presenting to clinic today for evaluation of adenocarcinoma of left upper lobe at the request of Dr. Dorris Fetch.  Patient had a CT lung cancer screening on 12/20/22 that found: a 19.9 mm irregular nodule in the anterior  left upper lobe, suspicious for primary bronchogenic carcinoma. She then had an initial PET on 02/19/23 that found: hypermetabolic irregular nodule in the anterior left upper lobe measures 2.1 x 1.6 cm, highly suspicious for primary bronchogenic carcinoma; mildly enlarged/prominent mediastinal lymph nodes are stable from chest CT May 2018; and no evidence of FDG avid metastatic disease in the neck, abdomen, or pelvis.   She then underwent an endobronchial [place] followed by a left upper lobectomy on 04/23/23 with Dr. Dorris Fetch. Surgical pathology of the lymph node aspirations were negative. The resection of the nodule on the left upper lobe of the lung revealed: poorly differentiated adenocarcinoma with focal extracellular mucin. Post-op, she developed A-fib and went home on amiodarone and Eliquis. She presented to the ED on 05/03/23 for drainage of her wound from the partial lobectomy. I independently viewed and interpreted Chest X-ray that found subtle diffuse interstitial opacity left lung status post left upper lobectomy consistent with edema or possible infection. However, she was asymptomatic and discharged.   Today, she states that she is doing well overall. Her appetite level is at 50%. Her energy level is at 25%.  She states she vomited all of her medication this morning and has had multiple episodes of this since lobectomy on 9/19. She was able to keep down Zofran this morning. She notes she has also had difficulty keeping down medications containing sulfur in the past. Since lobectomy, her energy levels have gradually improved. She has a follow-up with Dr. Dorris Fetch on 06/16/23. After her last visit with Dr. Dorris Fetch, she had a fall to her right side that reportedly caused bruising and minor cuts from the right leg to the right upper flank and right forearm. Bruising and cuts have mostly resolved, though she still reports mild soreness on her right ankle and right wrist.   INTERVAL HISTORY:    Courtney NUSSER is a 77 y.o. female presenting to the clinic today for follow-up of Left lung adenocarcinoma. She was last seen by me on 05/21/23 in consultation.  Since her last visit, she underwent Brain MRI on 05/28/23 that found: intermittently motion degraded examination; within this limitation, there is no evidence of intracranial metastatic disease; and mild chronic small vessel ischemic changes within the cerebral white matter. She also had a CT chest on 06/05/23 that found: interval left upper lobectomy with fluid and some air in the surgical bed; small subpleural, juxtapleural nodular area in the left lower lobe posteromedial; stable prominent mediastinal lymph nodes; no new mass lesion or lymph node enlargement in the thorax; and interval multiple anterolateral right rib fractures.  Today, she states that she is doing well overall. Her appetite level is at 50%. Her energy level is at 50%.    PAST MEDICAL HISTORY:   Past Medical History: Past Medical History:  Diagnosis Date   Angioedema 02/2013   Arthritis    Dyspnea    with exertion   GERD (gastroesophageal reflux disease)    Hypercholesterolemia    Hypertension    pt denies this (  04/21/23)   Left upper lobe pulmonary nodule    Lung cancer (HCC)    Migraine    Moderate aortic stenosis    Mean gradient 21.5 mmHg, AVA 1.24 cm by echo 03/2023   Peripheral artery disease [facility])     Surgical History: Past Surgical History:  Procedure Laterality Date   CAROTID STENT Right 08/04/2005   DILATION AND CURETTAGE OF UTERUS     EYE SURGERY     "muscle moved"   INTERCOSTAL NERVE BLOCK  04/23/2023   Procedure: INTERCOSTAL NERVE BLOCK;  Surgeon: Loreli Slot, MD;  Location: [facility] OR;  Service: Thoracic;;   LYMPH NODE BIOPSY Left 04/23/2023   Procedure: LYMPH NODE BIOPSY;  Surgeon: Loreli Slot, MD;  Location: [facility] OR;  Service: Thoracic;  Laterality: Left;   TONSILLECTOMY     in early 20's   VIDEO BRONCHOSCOPY WITH  ENDOBRONCHIAL ULTRASOUND N/A 04/23/2023   Procedure: VIDEO BRONCHOSCOPY WITH ENDOBRONCHIAL ULTRASOUND;  Surgeon: Loreli Slot, MD;  Location: [facility] OR;  Service: Thoracic;  Laterality: N/A;    Social History: Social History   Socioeconomic History   Marital status: Married    Spouse name: Not on file   Number of children: 1   Years of education: Not on file   Highest education level: Not on file  Occupational History   Not on file  Tobacco Use   Smoking status: Former    Types: Cigarettes    Start date: 04/22/2023    Quit date: 04/22/1963    Years since quitting: 60.1   Smokeless tobacco: Never  Vaping Use   Vaping status: Never Used  Substance and Sexual Activity   Alcohol use: Not Currently   Drug use: No   Sexual activity: Not on file  Other Topics Concern   Not on file  Social History Narrative   Lives home with husband, drinks caffeine 2 cups daily.  Education HS.  Children 1 son, 4 stepchildren.   Social Determinants of Health   Financial Resource Strain: Not on file  Food Insecurity: No Food Insecurity (04/23/2023)   Hunger Vital Sign    Worried About Running Out of Food in the Last Year: Never true    Ran Out of Food in the Last Year: Never true  Transportation Needs: No Transportation Needs (04/23/2023)   PRAPARE - Administrator, Civil Service (Medical): No    Lack of Transportation (Non-Medical): No  Physical Activity: Not on file  Stress: Not on file  Social Connections: Not on file  Intimate Partner Violence: Not At Risk (04/23/2023)   Humiliation, Afraid, Rape, and Kick questionnaire    Fear of Current or Ex-Partner: No    Emotionally Abused: No    Physically Abused: No    Sexually Abused: No    Family History: Family History  Problem Relation Age of Onset   Atrial fibrillation Mother    Diabetes Maternal Grandfather    Cancer Paternal Grandfather     Current Medications:  Current Outpatient Medications:    albuterol (VENTOLIN  HFA) 108 (90 Base) MCG/ACT inhaler, Inhale 2 puffs into the lungs every 4 (four) hours as needed., Disp: , Rfl:    amiodarone (PACERONE) 200 MG tablet, Take 2 tablets (400 mg total) by mouth 2 (two) times daily. For one week;then take 200 mg daily thereafter, Disp: 60 tablet, Rfl: 1   apixaban (ELIQUIS) 5 MG TABS tablet, Take 1 tablet (5 mg total) by mouth 2 (two) times daily., Disp: 60  tablet, Rfl: 3   cetirizine (ZYRTEC) 10 MG tablet, Take 10 mg by mouth at bedtime., Disp: , Rfl:    Cholecalciferol (VITAMIN D PO), Take 1 tablet by mouth in the morning., Disp: , Rfl:    clopidogrel (PLAVIX) 75 MG tablet, Take 1 tablet (75 mg total) by mouth daily with breakfast., Disp: 30 tablet, Rfl: 3   Cyanocobalamin (VITAMIN B-12 PO), Take 1 tablet by mouth in the morning., Disp: , Rfl:    diphenhydrAMINE (BENADRYL) 25 MG tablet, Take 25 mg by mouth at bedtime., Disp: , Rfl:    EPINEPHrine 0.3 mg/0.3 mL IJ SOAJ injection, Inject 0.3 mg into the muscle as needed., Disp: 2 each, Rfl: 1   gabapentin (NEURONTIN) 300 MG capsule, Take 1 capsule (300 mg total) by mouth 2 (two) times daily., Disp: 60 capsule, Rfl: 0   guaiFENesin (MUCINEX) 600 MG 12 hr tablet, Take 1 tablet (600 mg total) by mouth 2 (two) times daily., Disp: 30 tablet, Rfl: 0   ibuprofen (ADVIL) 200 MG tablet, Take 400 mg by mouth every 8 (eight) hours as needed (pain)., Disp: , Rfl:    MAGNESIUM PO, Take 1 capsule by mouth in the morning and at bedtime., Disp: , Rfl:    metoprolol tartrate (LOPRESSOR) 25 MG tablet, Take 0.5 tablets (12.5 mg total) by mouth 2 (two) times daily., Disp: 60 tablet, Rfl: 1   Omega-3 Fatty Acids (OMEGA-3 FISH OIL PO), Take 1 capsule by mouth in the morning., Disp: , Rfl:    ondansetron (ZOFRAN-ODT) 8 MG disintegrating tablet, Take 1 tablet (8 mg total) by mouth every 8 (eight) hours as needed for nausea or vomiting., Disp: 20 tablet, Rfl: 0   pantoprazole (PROTONIX) 40 MG tablet, Take 40 mg by mouth daily before breakfast.,  Disp: , Rfl:    Polyethyl Glycol-Propyl Glycol (SYSTANE) 0.4-0.3 % GEL ophthalmic gel, Place 1 Application into both eyes at bedtime as needed (dry/irritated eyes.)., Disp: , Rfl:    rosuvastatin (CRESTOR) 40 MG tablet, Take 40 mg by mouth at bedtime., Disp: , Rfl:    vitamin E 400 UNIT capsule, Take 400 Units by mouth in the morning., Disp: , Rfl:   Current Facility-Administered Medications:    ondansetron (ZOFRAN-ODT) disintegrating tablet 8 mg, 8 mg, Oral, Q8H PRN,    Allergies: Allergies  Allergen Reactions   Bee Venom Anaphylaxis   Wasp Venom Anaphylaxis   Aspirin Swelling    REVIEW OF SYSTEMS:   Review of Systems  Constitutional:  Negative for chills, fatigue and fever.  HENT:   Negative for lump/mass, mouth sores, nosebleeds, sore throat and trouble swallowing.   Eyes:  Negative for eye problems.  Respiratory:  Negative for cough and shortness of breath.   Cardiovascular:  Negative for chest pain, leg swelling and palpitations.  Gastrointestinal:  Positive for constipation and nausea. Negative for abdominal pain, diarrhea and vomiting.  Genitourinary:  Negative for bladder incontinence, difficulty urinating, dysuria, frequency, hematuria and nocturia.   Musculoskeletal:  Negative for arthralgias, back pain, flank pain, myalgias and neck pain.  Skin:  Negative for itching and rash.  Neurological:  Negative for dizziness, headaches and numbness.  Hematological:  Does not bruise/bleed easily.  Psychiatric/Behavioral:  Positive for sleep disturbance. Negative for depression and suicidal ideas. The patient is not nervous/anxious.   All other systems reviewed and are negative.    VITALS:   Blood pressure 111/68, pulse (!) 55, temperature (!) 97.5 F (36.4 C), temperature source Tympanic, resp. rate 16, weight 152 lb  6.4 oz (69.1 kg), SpO2 92%.  Wt Readings from Last 3 Encounters:  06/17/23 152 lb 6.4 oz (69.1 kg)  06/16/23 152 lb (68.9 kg)  05/21/23 156 lb 3.2 oz (70.9 kg)     Body mass index is 27 kg/m.  Performance status (ECOG): 1 - Symptomatic but completely ambulatory  PHYSICAL EXAM:   Physical Exam Vitals and nursing note reviewed. Exam conducted with a chaperone present.  Constitutional:      Appearance: Normal appearance.  Cardiovascular:     Rate and Rhythm: Normal rate and regular rhythm.     Pulses: Normal pulses.     Heart sounds: Normal heart sounds.  Pulmonary:     Effort: Pulmonary effort is normal.     Breath sounds: Normal breath sounds.  Abdominal:     Palpations: Abdomen is soft. There is no hepatomegaly, splenomegaly or mass.     Tenderness: There is no abdominal tenderness.  Musculoskeletal:     Right lower leg: No edema.     Left lower leg: No edema.  Lymphadenopathy:     Cervical: No cervical adenopathy.     Right cervical: No superficial, deep or posterior cervical adenopathy.    Left cervical: No superficial, deep or posterior cervical adenopathy.     Upper Body:     Right upper body: No supraclavicular or axillary adenopathy.     Left upper body: No supraclavicular or axillary adenopathy.  Neurological:     General: No focal deficit present.     Mental Status: She is alert and oriented to person, place, and time.  Psychiatric:        Mood and Affect: Mood normal.        Behavior: Behavior normal.     LABS:      Latest Ref Rng & Units 05/03/2023   12:29 PM 04/25/2023    2:36 AM 04/24/2023    8:48 AM  CBC  WBC 4.0 - 10.5 K/uL 10.4  10.5  11.3   Hemoglobin 12.0 - 15.0 g/dL 29.5  62.1  30.8   Hematocrit 36.0 - 46.0 % 41.4  35.9  37.1   Platelets 150 - 400 K/uL 343  188  187       Latest Ref Rng & Units 06/05/2023    1:42 PM 05/03/2023   12:29 PM 04/26/2023    2:34 AM  CMP  Glucose 70 - 99 mg/dL  657  846   BUN 8 - 23 mg/dL  17  15   Creatinine 9.62 - 1.00 mg/dL 9.52  8.41  3.24   Sodium 135 - 145 mmol/L  137  137   Potassium 3.5 - 5.1 mmol/L  3.7  4.2   Chloride 98 - 111 mmol/L  101  104   CO2 22 - 32  mmol/L  23  25   Calcium 8.9 - 10.3 mg/dL  9.2  9.0   Total Protein 6.5 - 8.1 g/dL  6.7    Total Bilirubin 0.3 - 1.2 mg/dL  0.6    Alkaline Phos 38 - 126 U/L  71    AST 15 - 41 U/L  21    ALT 0 - 44 U/L  22       No results found for: "CEA1", "CEA" / No results found for: "CEA1", "CEA" No results found for: "PSA1" No results found for: "MWN027" No results found for: "CAN125"  No results found for: "TOTALPROTELP", "ALBUMINELP", "A1GS", "A2GS", "BETS", "BETA2SER", "GAMS", "MSPIKE", "SPEI" No results found for: "  TIBC", "FERRITIN", "IRONPCTSAT" No results found for: "LDH"   STUDIES:   CT Chest W Contrast  Result Date: 06/16/2023 CLINICAL DATA:  Non-small-cell lung cancer. Prior left upper lobectomy. * Tracking Code: BO *. EXAM: CT CHEST WITH CONTRAST TECHNIQUE: Multidetector CT imaging of the chest was performed during intravenous contrast administration. RADIATION DOSE REDUCTION: This exam was performed according to the departmental dose-optimization program which includes automated exposure control, adjustment of the mA and/or kV according to patient size and/or use of iterative reconstruction technique. CONTRAST:  50mL OMNIPAQUE IOHEXOL 350 MG/ML SOLN COMPARISON:  Standard CT 12/16/2022.  PET-CT scan 02/19/2023. FINDINGS: Cardiovascular: Heart is nonenlarged. Prominent calcifications along the mitral valve annulus in the aortic valve region. No significant pericardial effusion. The thoracic aorta has a normal course and caliber with mild scattered atherosclerotic calcified plaque. Separate origin of the left vertebral artery directly from the aortic arch just proximal to the origin of the left subclavian. Normal variant. Mediastinum/Nodes: Patulous esophagus. Preserved thyroid gland. Specific abnormal lymph node enlargement identified in the axillary regions, hilum. Several prominent mediastinal nodes are again identified, similar to previous. The right paratracheal node for example which  previously has a diameter of 12 mm in short axis, today measures 10 mm on series 3, image 54. Other nodes are similar. No new nodal enlargement in the mediastinum. Lungs/Pleura: Emphysematous lung changes identified. Right lung is without consolidation, pneumothorax or effusion. Dependent atelectasis on the right side. Left has a interval changes of upper lobectomy. There is some fluid and air in the surgical bed which easily could be postsurgical itself. Tiny left effusion dependently. The previous left upper lobe mass obviously no longer identified. There are some scattered areas of scarring atelectatic changes in the left lower lobe. Small nodular area juxtapleural posteromedial in the left lung measures 13 mm on series 7, image 44. Attention on short follow-up. Upper Abdomen: Adrenal glands are preserved. Musculoskeletal: Scattered degenerative changes along the spine multiple right-sided rib fractures are identified anterolateral including the third through seventh ribs. These are not clearly seen previously. Please correlate with the history. IMPRESSION: Interval left upper lobectomy with fluid and some air in the surgical bed. Attention on follow-up. Small subpleural, juxtapleural nodular area in the left lower lobe posteromedial. Attention on follow-up. Stable prominent mediastinal lymph nodes. No new mass lesion or lymph node enlargement in the thorax. Interval multiple anterolateral right rib fractures. Please correlate with clinical history. Aortic Atherosclerosis (ICD10-I70.0) and Emphysema (ICD10-J43.9). Electronically Signed   By: Karen Kays M.D.   On: 06/16/2023 17:48   DG Chest 2 View  Result Date: 06/16/2023 CLINICAL DATA:  Lobectomy EXAM: CHEST - 2 VIEW COMPARISON:  X-ray 05/08/2023. FINDINGS: Artifact overlies the anterior midthorax. Volume loss along left hemithorax with small left effusion versus pleural thickening. No pneumothorax, edema. Normal cardiopericardial silhouette. Appearance  is similar to previous exam. IMPRESSION: Stable chest x-ray. Volume loss along left hemithorax with a small effusion versus pleural thickening. History of prior lobectomy Electronically Signed   By: Karen Kays M.D.   On: 06/16/2023 17:39   MR Brain W Wo Contrast  Result Date: 06/01/2023 CLINICAL DATA:  Provided history: Adenocarcinoma of upper lobe of left lung. Non-small cell lung cancer, monitor. EXAM: MRI HEAD WITHOUT AND WITH CONTRAST TECHNIQUE: Multiplanar, multiecho pulse sequences of the brain and surrounding structures were obtained without and with intravenous contrast. CONTRAST:  7mL GADAVIST GADOBUTROL 1 MMOL/ML IV SOLN COMPARISON:  PET CT 02/19/2023. FINDINGS: Intermittently motion degraded examination (with up to moderate  motion degradation of the acquired sequences). Brain: No age advanced or lobar predominant parenchymal atrophy. Multifocal T2 FLAIR hyperintense signal abnormality within the cerebral white matter, nonspecific but compatible with mild chronic small vessel ischemic disease. There is no acute infarct. No evidence of an intracranial mass. No chronic intracranial blood products. No extra-axial fluid collection. No midline shift. No pathologic intracranial enhancement identified. Vascular: Maintained flow voids within the proximal large arterial vessels. Skull and upper cervical spine: No focal worrisome marrow lesion. Incompletely assessed cervical spondylosis. Sinuses/Orbits: No mass or acute finding within the imaged orbits. Prior bilateral ocular lens replacement. No significant paranasal sinus disease. IMPRESSION: 1. Intermittently motion degraded examination. 2. Within this limitation, there is no evidence of intracranial metastatic disease. 3. Mild chronic small vessel ischemic changes within the cerebral white matter. Electronically Signed   By: Jackey Loge D.O.   On: 06/01/2023 19:55

## 2023-06-17 NOTE — Patient Instructions (Signed)
[facility] at [facility] Discharge Instructions   You were seen and examined today by Dr. Ellin Saba.  He reviewed the results of your MRI of the brain which was normal.   He reviewed the results of your CT scan of the chest. It is showing post-surgical changes in your lung. It is also showing a spot in the left lower lung, which does not appear to be cancerous. We will continue to keep an eye on this.   We will see you back in 6 months. We will repeat lab work and a CT scan prior to this visit.  Return as scheduled.    Thank you for choosing [facility] at [facility] to provide your oncology and hematology care.  To afford each patient quality time with our provider, please arrive at least 15 minutes before your scheduled appointment time.   If you have a lab appointment with the Cancer Center please come in thru the Main Entrance and check in at the main information desk.  You need to re-schedule your appointment should you arrive 10 or more minutes late.  We strive to give you quality time with our providers, and arriving late affects you and other patients whose appointments are after yours.  Also, if you no show three or more times for appointments you may be dismissed from the clinic at the providers discretion.     Again, thank you for choosing [facility].  Our hope is that these requests will decrease the amount of time that you wait before being seen by our physicians.       _____________________________________________________________  Should you have questions after your visit to [facility], please contact our office at [phone] and follow the prompts.  Our office hours are 8:00 a.m. and 4:30 p.m. Monday - Friday.  Please note that voicemails left after 4:00 p.m. may not be returned until the following business day.  We are closed weekends and major holidays.  You do have access to a nurse 24-7, just  call the main number to the clinic [phone] and do not press any options, hold on the line and a nurse will answer the phone.    For prescription refill requests, have your pharmacy contact our office and allow 72 hours.    Due to Covid, you will need to wear a mask upon entering the hospital. If you do not have a mask, a mask will be given to you at the Main Entrance upon arrival. For doctor visits, patients may have 1 support person age 21 or older with them. For treatment visits, patients can not have anyone with them due to social distancing guidelines and our immunocompromised population.

## 2023-06-25 ENCOUNTER — Telehealth: Payer: Self-pay

## 2023-06-25 NOTE — Telephone Encounter (Signed)
Patient contacted the office with continued off/on nausea and vomiting. She states this started after having an MRI of the brain with contrast. She states that there are times where she feels well and other she has continued N/V. She is s/p RATS LUL wedge and LU lobectomy with Dr. Dorris Fetch 04/23/23. Spoke with [place], [place] whom states patient should discuss symptoms with her PCP as symptoms do not seem to be surgically related.

## 2023-06-26 ENCOUNTER — Ambulatory Visit: Payer: Medicare HMO | Attending: General Practice

## 2023-06-26 DIAGNOSIS — I4891 Unspecified atrial fibrillation: Secondary | ICD-10-CM

## 2023-06-29 DIAGNOSIS — R110 Nausea: Secondary | ICD-10-CM | POA: Diagnosis not present

## 2023-06-30 ENCOUNTER — Telehealth: Payer: Self-pay | Admitting: General Practice

## 2023-06-30 NOTE — Telephone Encounter (Signed)
Pt is returning call to nurse for results

## 2023-06-30 NOTE — Telephone Encounter (Signed)
Courtney Asters, NP 06/29/2023  6:41 AM EST     Please contact Courtney Gill and let her know that her cardiac event monitor results of been reviewed.  She was noted to have paroxysmal atrial fibrillation 6% of the time.  Rare extra beats from the bottom of her heart were also noted.  We will continue her amiodarone and apixaban.  Thank you.    Patient identification verified by 2 forms. Courtney Rail, RN    Called and spoke to patient  Relayed result message  Patient verbalized understanding, no questions at this time

## 2023-07-10 ENCOUNTER — Telehealth: Payer: Self-pay

## 2023-07-10 ENCOUNTER — Other Ambulatory Visit: Payer: Self-pay

## 2023-07-10 MED ORDER — GABAPENTIN 300 MG PO CAPS: 300.0000 mg | ORAL_CAPSULE | Freq: Two times a day (BID) | ORAL | 0 refills | Status: DC

## 2023-07-10 NOTE — Telephone Encounter (Signed)
-----   Message from Loreli Slot sent at 07/10/2023 12:11 PM EST ----- Regarding: RE: Gabapentin refill If she needs it we can fill it, probably would be easlier for her just to get from primary but whatever  [facility] ----- Message ----- From: Steve Rattler, RN Sent: 07/10/2023  11:53 AM EST To: Loreli Slot, MD Subject: Gabapentin refill                              Hey,  She is requesting a refill of Gabapentin. I did refill it. Do you want to continue to refill after this one? She does follow up in 6 months.  Thanks, Morrie Sheldon

## 2023-07-13 ENCOUNTER — Other Ambulatory Visit (HOSPITAL_COMMUNITY): Payer: Medicare HMO

## 2023-07-16 DIAGNOSIS — R809 Proteinuria, unspecified: Secondary | ICD-10-CM | POA: Diagnosis not present

## 2023-07-16 DIAGNOSIS — N183 Chronic kidney disease, stage 3 unspecified: Secondary | ICD-10-CM | POA: Diagnosis not present

## 2023-07-23 ENCOUNTER — Other Ambulatory Visit (HOSPITAL_COMMUNITY): Payer: Self-pay | Admitting: Family Medicine

## 2023-07-23 DIAGNOSIS — I714 Abdominal aortic aneurysm, without rupture, unspecified: Secondary | ICD-10-CM | POA: Diagnosis not present

## 2023-07-23 DIAGNOSIS — J449 Chronic obstructive pulmonary disease, unspecified: Secondary | ICD-10-CM | POA: Diagnosis not present

## 2023-07-23 DIAGNOSIS — F17200 Nicotine dependence, unspecified, uncomplicated: Secondary | ICD-10-CM | POA: Diagnosis not present

## 2023-07-23 DIAGNOSIS — E782 Mixed hyperlipidemia: Secondary | ICD-10-CM | POA: Diagnosis not present

## 2023-07-23 DIAGNOSIS — T7800XD Anaphylactic reaction due to unspecified food, subsequent encounter: Secondary | ICD-10-CM | POA: Diagnosis not present

## 2023-07-23 DIAGNOSIS — N183 Chronic kidney disease, stage 3 unspecified: Secondary | ICD-10-CM | POA: Diagnosis not present

## 2023-07-23 DIAGNOSIS — R011 Cardiac murmur, unspecified: Secondary | ICD-10-CM | POA: Diagnosis not present

## 2023-07-23 DIAGNOSIS — R5383 Other fatigue: Secondary | ICD-10-CM | POA: Diagnosis not present

## 2023-07-23 DIAGNOSIS — R809 Proteinuria, unspecified: Secondary | ICD-10-CM | POA: Diagnosis not present

## 2023-07-23 DIAGNOSIS — E860 Dehydration: Secondary | ICD-10-CM | POA: Diagnosis not present

## 2023-07-23 DIAGNOSIS — K219 Gastro-esophageal reflux disease without esophagitis: Secondary | ICD-10-CM | POA: Diagnosis not present

## 2023-07-23 DIAGNOSIS — I739 Peripheral vascular disease, unspecified: Secondary | ICD-10-CM | POA: Diagnosis not present

## 2023-09-01 ENCOUNTER — Ambulatory Visit: Payer: Medicare HMO | Admitting: Cardiovascular Disease

## 2023-09-15 NOTE — Progress Notes (Unsigned)
Cardiology Office Note:  .   Date:  09/16/2023  ID:  Courtney Gill, DOB 02-Jul-1946, MRN [id number] PCP: Benita Stabile, MD  [facility] Providers Cardiologist:  Reatha Harps, MD { History of Present Illness: .    Chief Complaint  Patient presents with   Follow-up    Courtney Gill is a 78 y.o. female with below history who presents for follow-up.    History of Present Illness   Courtney Gill is a 78 year old female with atrial fibrillation who presents for follow-up after lung surgery. She is accompanied by her husband.  She has a history of atrial fibrillation that developed after lung surgery, with a recent monitor showing a 6% AFib burden. No palpitations or chest pain are reported. She is currently on Eliquis and metoprolol tartrate, with amiodarone recently discontinued. She experienced nausea and stopped taking amiodarone temporarily due to losing the bottle. She recalls undergoing tests with dye that caused a week and a half of vomiting.  She reports left leg pain for about a week, described as achy. No history of heart attacks or known blockages in her legs, though she had phlebitis during pregnancy requiring heparin shots. She bruises easily and inquires about using ibuprofen for migraines, now using Tylenol instead.  Her past medical history includes lung cancer status post lobectomy, moderate aortic stenosis, hypertension, right carotid artery stenting, COPD, and an alpha-gal allergy, both of which she believes are resolved. She is allergic to bees, with no recent severe reactions.  She quit smoking after her lung surgery. She worked in a [employer] and frequently drove to [place]. She has one child, her husband has six children, and she has grandchildren, great-grandchildren, and one great-great-grandchild.  No chest pain, shortness of breath, or palpitations. She experiences nausea and bruising easily. She feels tired and has difficulty sleeping, waking up frequently at  night to use the bathroom. She occasionally uses an inhaler for breathing issues.          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 Paroxysmal Afib  -04/2023 -> after lung CA surgery -6% burden on monitor  3. Aortic stenosis -moderate 8/158/2024 4. HLD 5. Carotid artery disease -s/p R carotid stent 2007 6. COPD 7. Alpha Gal     ROS: All other ROS reviewed and negative. Pertinent positives noted in the HPI.     Studies Reviewed: Marland Kitchen        [facility] 06/26/2023 Impression: Paroxysmal atrial fibrillation present (6% burden).  Rare ectopy.   TTE 03/19/2023  1. Left ventricular ejection fraction, by estimation, is 65 to 70%. The  left ventricle has normal function. The left ventricle has no regional  wall motion abnormalities. There is moderate left ventricular hypertrophy.  Left ventricular diastolic  parameters are consistent with Grade I diastolic dysfunction (impaired  relaxation). Elevated left atrial pressure.   2. Right ventricular systolic function is normal. The right ventricular  size is normal. There is normal pulmonary artery systolic pressure.   3. Left atrial size was moderately dilated.   4. The mitral valve is abnormal. Trivial mitral valve regurgitation. No  evidence of mitral stenosis. Moderate mitral annular calcification.   5. The tricuspid valve is abnormal.   6. The aortic valve is tricuspid. There is moderate calcification of the  aortic valve. There is moderate thickening of the aortic valve. Aortic  valve regurgitation is mild. Moderate aortic valve stenosis. Aortic valve  mean gradient measures 21.5  mmHg.  Aortic valve peak gradient measures 35.5 mmHg. Aortic valve area, by VTI  measures 1.24 cm.   7. The inferior vena cava is normal in size with greater than 50%  respiratory variability, suggesting right atrial pressure of 3 mmHg.  Physical Exam:   VS:  BP (!) 104/58 (BP Location: Right Arm, Patient Position: Sitting, Cuff Size: Normal)    Pulse 60   Ht 5\' 4"  (1.626 m)   Wt 159 lb (72.1 kg)   BMI 27.29 kg/m    Wt Readings from Last 3 Encounters:  09/16/23 159 lb (72.1 kg)  06/17/23 152 lb 6.4 oz (69.1 kg)  06/16/23 152 lb (68.9 kg)    GEN: Well nourished, well developed in no acute distress NECK: No JVD; No carotid bruits CARDIAC: RRR, 3/6 SEM RESPIRATORY:  Clear to auscultation without rales, wheezing or rhonchi  ABDOMEN: Soft, non-tender, non-distended EXTREMITIES:  No edema; No deformity  ASSESSMENT AND PLAN: .   Assessment and Plan    Atrial Fibrillation Paroxysmal AFib post lung surgery with 6% burden. No symptoms reported. Currently on Eliquis and Metoprolol. Amiodarone to be discontinued. -Continue Eliquis 5mg  BID for stroke prophylaxis. -Continue Metoprolol Tartrate 12.5mg  BID. -Discontinue Amiodarone.  Carotid Artery Disease History of right carotid stenting, not evaluated recently. Currently on Plavix and Eliquis. -Order Carotid Duplexes. -Discontinue Plavix.  Moderate Aortic Stenosis No symptoms reported. Murmur consistent with moderate aortic stenosis. -Order repeat echo before next appointment in 1 year.  Hyperlipidemia Most recent LDL 74. Currently on Crestor. -Continue Crestor 40mg  daily.  Leg Pain Reports of leg pain, but pulses in lower extremities are strong. No history of PAD. -No change in management, continue monitoring.  Follow-up -Return for follow-up in 1 year.              Follow-up: Return in about 1 year (around 09/15/2024).   Signed, Lenna Gilford. Flora Lipps, MD, [facility]  [facility]  [address], [address] [place], [place] [address] [phone]  4:47 PM

## 2023-09-16 ENCOUNTER — Encounter: Payer: Self-pay | Admitting: Cardiovascular Disease

## 2023-09-16 ENCOUNTER — Ambulatory Visit: Payer: Medicare HMO | Attending: Cardiovascular Disease | Admitting: Cardiovascular Disease

## 2023-09-16 VITALS — BP 104/58 | HR 60 | Ht 64.0 in | Wt 159.0 lb

## 2023-09-16 DIAGNOSIS — I779 Disorder of arteries and arterioles, unspecified: Secondary | ICD-10-CM

## 2023-09-16 DIAGNOSIS — E782 Mixed hyperlipidemia: Secondary | ICD-10-CM

## 2023-09-16 DIAGNOSIS — I10 Essential (primary) hypertension: Secondary | ICD-10-CM

## 2023-09-16 DIAGNOSIS — I350 Nonrheumatic aortic (valve) stenosis: Secondary | ICD-10-CM

## 2023-09-16 DIAGNOSIS — I480 Paroxysmal atrial fibrillation: Secondary | ICD-10-CM

## 2023-09-16 DIAGNOSIS — M79605 Pain in left leg: Secondary | ICD-10-CM

## 2023-09-16 NOTE — Patient Instructions (Signed)
Medication Instructions:  Your physician has recommended you make the following change in your medication:  STOP: Amiodarone  STOP: Clopidogrel (Plavix)  *If you need a refill on your cardiac medications before your next appointment, please call your pharmacy*   Testing/Procedures: Your physician has requested that you have an echocardiogram in  1 year. Echocardiography is a painless test that uses sound waves to create images of your heart. It provides your doctor with information about the size and shape of your heart and how well your heart's chambers and valves are working. This procedure takes approximately one hour. There are no restrictions for this procedure. Please do NOT wear cologne, perfume, aftershave, or lotions (deodorant is allowed). Please arrive 15 minutes prior to your appointment time.  Please note: We ask at that you not bring children with you during ultrasound (echo/ vascular) testing. Due to room size and safety concerns, children are not allowed in the ultrasound rooms during exams. Our front office staff cannot provide observation of children in our lobby area while testing is being conducted. An adult accompanying a patient to their appointment will only be allowed in the ultrasound room at the discretion of the ultrasound technician under special circumstances. We apologize for any inconvenience.  Your physician has requested that you have a carotid duplex. This test is an ultrasound of the carotid arteries in your neck. It looks at blood flow through these arteries that supply the brain with blood. Allow one hour for this exam. There are no restrictions or special instructions.  Follow-Up: At [facility], you and your health needs are our priority.  As part of our continuing mission to provide you with exceptional heart care, we have created designated Provider Care Teams.  These Care Teams include your primary Cardiologist (physician) and Advanced Practice  Providers (APPs -  Physician Assistants and Nurse Practitioners) who all work together to provide you with the care you need, when you need it.   Your next appointment:   1 year(s)  Provider:   Reatha Harps, MD

## 2023-09-18 ENCOUNTER — Encounter: Payer: Self-pay | Admitting: Allergy & Immunology

## 2023-09-18 ENCOUNTER — Telehealth: Payer: Self-pay | Admitting: Cardiovascular Disease

## 2023-09-18 ENCOUNTER — Ambulatory Visit (INDEPENDENT_AMBULATORY_CARE_PROVIDER_SITE_OTHER): Payer: Medicare Other | Admitting: Allergy & Immunology

## 2023-09-18 ENCOUNTER — Other Ambulatory Visit: Payer: Self-pay

## 2023-09-18 VITALS — BP 130/82 | HR 55 | Temp 98.0°F | Resp 16 | Ht 64.0 in | Wt 159.0 lb

## 2023-09-18 DIAGNOSIS — T63481D Toxic effect of venom of other arthropod, accidental (unintentional), subsequent encounter: Secondary | ICD-10-CM

## 2023-09-18 DIAGNOSIS — L281 Prurigo nodularis: Secondary | ICD-10-CM | POA: Diagnosis not present

## 2023-09-18 DIAGNOSIS — R0602 Shortness of breath: Secondary | ICD-10-CM

## 2023-09-18 DIAGNOSIS — C3412 Malignant neoplasm of upper lobe, left bronchus or lung: Secondary | ICD-10-CM | POA: Diagnosis not present

## 2023-09-18 MED ORDER — EPINEPHRINE 0.3 MG/0.3ML IJ SOAJ: 0.3000 mg | INTRAMUSCULAR | 1 refills | Status: AC | PRN

## 2023-09-18 MED ORDER — CLOBETASOL PROPIONATE 0.05 % EX OINT: 1.0000 | TOPICAL_OINTMENT | Freq: Two times a day (BID) | CUTANEOUS | 3 refills | Status: AC

## 2023-09-18 NOTE — Telephone Encounter (Signed)
Pt had appt with allergist today and her HR 56 and wanted pt to call to advise, requesting cb  STAT if HR is under 50 or over 120 (normal HR is 60-100 beats per minute)  What is your heart rate? 56  Do you have a log of your heart rate readings (document readings)? no  Do you have any other symptoms? No other symptoms, states she feels fine

## 2023-09-18 NOTE — Patient Instructions (Addendum)
1. Insect sting allergy (honeybee, white-faced hornet, yellowj acket, and paper wasp) - We will hold off on venom immunotherapy. - EpiPen sent in to have on hand if needed.   2. Rash - Add on clobetasol twice daily on the lesions on your back.  - This should help to heal them. - DO NOT use on the face.   3. Return in about 1 year (around 09/17/2024). You can have the follow up appointment with Dr. Dellis Anes or a Nurse Practicioner (our Nurse Practitioners are excellent and always have Physician oversight!).    Please inform us of any Emergency Department visits, hospitalizations, or changes in symptoms. Call us before going to the ED for breathing or allergy symptoms since we might be able to fit you in for a sick visit. Feel free to contact us anytime with any questions, problems, or concerns.  It was a pleasure to see you again today!  Websites that have reliable patient information: 1. American Academy of Asthma, Allergy, and Immunology: www.aaaai.org 2. Food Allergy Research and Education (FARE): foodallergy.org 3. Mothers of Asthmatics: http://www.asthmacommunitynetwork.org 4. American College of Allergy, Asthma, and Immunology: www.acaai.org      "Like" [place] on Facebook and Instagram for our latest updates!      A healthy democracy works best when [employer] participate! Make sure you are registered to vote! If you have moved or changed any of your contact information, you will need to get this updated before voting! Scan the QR codes below to learn more!

## 2023-09-18 NOTE — Progress Notes (Signed)
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 Insect sting allergy (honeybee, white-faced hornet, yellowj acket, and paper wasp) - We will hold off on venom immunotherapy. - EpiPen sent in to have on hand if needed.   2. Rash - Add on clobetasol twice daily on the lesions on your back.  - This should help to heal them. - DO NOT use on the face.   3. Return in about 1 year (around 09/17/2024). You can have the follow up appointment with Dr. Dellis Anes or a Nurse Practicioner (our Nurse Practitioners are excellent and always have Physician oversight!).    Subjective:   Courtney Gill is a 78 y.o. 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  Discussed the use of AI scribe software for clinical note  transcription with the patient and/or guardian, who gave verbal consent to proceed.  Courtney Gill is a 78 y.o. female presenting for a follow up visit. She was last seen in August 2024. At that time, we got a stinging insect panel that showed sensitization to honeybee, white-faced hornet, yellowj acket, and paper wasp. She was interested in doing venom immunotherapy, but she had a recent diagnosis of lung cancer so she decided to hold off.   LOREA Gill is a 78 year old female who presents with breathing problems and skin lesions.  She has been experiencing breathing problems for almost a year. Most of her breathing issues have resolved following surgery, although she still experiences difficulty breathing when lying in bed and reaching her back. She had a PET scan which made her nauseous for about a week and a half, during which she was bedridden. She uses Zofran for nausea, which she finds effective. No pain associated with her breathing issues.  She has skin lesions on her back, described as 'like bites or something'. She is unsure of their origin and mentions that they have been present for a long time. She has multiple lesions, including a large one and some smaller ones. She has not noticed any bites and does not believe they are from fleas or ticks. She uses topical treatments she has on hand but is unsure if they are effective. No known insect bites causing her skin lesions.  She mentions an off and on pain in her left leg,  which she discussed with her heart doctor. She does not associate this pain with her breathing issues.  She quit smoking the day before her surgery in October and has not smoked since. 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     Otherwise, there have been no changes to her past medical history, surgical history, family history, or social history.    Review of systems otherwise negative other than that  mentioned in the HPI.    Objective:   Blood pressure 130/82, pulse (!) 55, temperature 98 F (36.7 C), resp. rate 16, height 5\' 4"  (1.626 m), weight 159 lb (72.1 kg), SpO2 95%. Body mass index is 27.29 kg/m.    Physical Exam Vitals reviewed.  Constitutional:      Appearance: She is well-developed.     Comments: Talkative as ever.  HENT:     Head: Normocephalic and atraumatic.     Right Ear: Tympanic membrane, ear canal and external ear normal.     Left Ear: Tympanic membrane, ear canal and external ear normal.     Nose: No nasal deformity, septal deviation, mucosal edema or rhinorrhea.     Right Turbinates: Enlarged, swollen and pale.     Left Turbinates: Enlarged, swollen and pale.     Right Sinus: No maxillary sinus tenderness or frontal sinus tenderness.     Left Sinus: No maxillary sinus tenderness or frontal sinus tenderness.     Mouth/Throat:     Lips: Pink.     Mouth: Mucous membranes are moist. Mucous membranes are not pale and not dry.     Pharynx: Uvula midline.  Eyes:     General: Lids are normal. No allergic shiner.       Right eye: No discharge.        Left eye: No discharge.     Conjunctiva/sclera: Conjunctivae normal.     Right eye: Right conjunctiva is not injected. No chemosis.    Left eye: Left conjunctiva is not injected. No chemosis.    Pupils: Pupils are equal, round, and reactive to light.  Cardiovascular:     Rate and Rhythm: Normal rate and regular rhythm.     Heart sounds: Normal heart sounds.  Pulmonary:     Effort: Pulmonary effort is normal. No tachypnea, accessory muscle usage or respiratory distress.     Breath sounds: Normal breath sounds. No wheezing, rhonchi or rales.  Chest:     Chest wall: No tenderness.  Lymphadenopathy:     Cervical: No cervical adenopathy.  Skin:    General: Skin is warm.     Capillary Refill: Capillary refill takes less than 2 seconds.     Coloration: Skin is not pale.     Findings: No abrasion, erythema,  petechiae or rash. Rash is not papular, urticarial or vesicular.     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  Neurological:     Mental Status: She is alert.  Psychiatric:        Behavior: Behavior is cooperative.      Diagnostic studies: none      Malachi Bonds, MD  [facility]

## 2023-09-18 NOTE — Telephone Encounter (Signed)
Patient identification verified by 2 forms. Marilynn Rail, RN    Called and spoke to patient  Patient states:   -heart rate is ranging 55-56   -allergist is concerned that heart rate was too low at 55   -Takes metoprolol 12.5mg  twice daily   -she d/c Amiodarone & Plavix per Dr. Flora Lipps  Patient denies:   -lightheadedness/dizziness   -fatigue   -SOB  Informed patient:   -message sent to Dr. Flora Lipps for additional input   -monitor symptoms and heart rate, especially if decreased to <50  Reviewed ED warning signs/precautions  Patient verbalized understanding, no questions at this time

## 2023-09-21 NOTE — Telephone Encounter (Signed)
Sande Rives, MD     If no symptoms, ok to continue.  Gerri Spore T. Flora Lipps, MD, [facility]  [facility] [address] [place], [place] [address] [phone] 5:56 PM    Patient identification verified by 2 forms. Marilynn Rail, RN    Called and spoke to patient  Patient states:   -continues to feel well at this time  Patient denies:   -fatigue   -SOB   -Lightheadedness/dizziness  Advised patient to outreach if develops symptoms  Reviewed ED warning signs/precautions Patient verbalized understanding, no questions at this time

## 2023-10-12 ENCOUNTER — Ambulatory Visit (HOSPITAL_COMMUNITY): Payer: Medicare HMO

## 2023-10-21 ENCOUNTER — Ambulatory Visit (HOSPITAL_COMMUNITY)
Admission: RE | Admit: 2023-10-21 | Discharge: 2023-10-21 | Disposition: A | Discharge status: Home / Self Care | Source: Ambulatory Visit | Attending: Cardiovascular Disease | Admitting: Cardiovascular Disease

## 2023-10-21 DIAGNOSIS — I779 Disorder of arteries and arterioles, unspecified: Secondary | ICD-10-CM | POA: Insufficient documentation

## 2023-10-21 DIAGNOSIS — I6521 Occlusion and stenosis of right carotid artery: Secondary | ICD-10-CM | POA: Insufficient documentation

## 2023-10-23 ENCOUNTER — Encounter: Payer: Self-pay | Admitting: Cardiovascular Disease

## 2023-11-13 ENCOUNTER — Other Ambulatory Visit (HOSPITAL_COMMUNITY): Payer: Self-pay | Admitting: Family Medicine

## 2023-11-13 DIAGNOSIS — R1012 Left upper quadrant pain: Secondary | ICD-10-CM | POA: Diagnosis not present

## 2023-11-16 ENCOUNTER — Other Ambulatory Visit (HOSPITAL_COMMUNITY): Payer: Self-pay | Admitting: Family Medicine

## 2023-11-16 ENCOUNTER — Ambulatory Visit (HOSPITAL_COMMUNITY)
Admission: RE | Admit: 2023-11-16 | Discharge: 2023-11-16 | Disposition: A | Discharge status: Home / Self Care | Source: Ambulatory Visit | Attending: Family Medicine | Admitting: Family Medicine

## 2023-11-16 DIAGNOSIS — R1012 Left upper quadrant pain: Secondary | ICD-10-CM | POA: Insufficient documentation

## 2023-11-16 DIAGNOSIS — Z902 Acquired absence of lung [part of]: Secondary | ICD-10-CM | POA: Diagnosis not present

## 2023-11-16 IMAGING — MG MM DIGITAL SCREENING BILAT W/ TOMO AND CAD
6 of 12 series · 6 of 36 positions shown · non-contrast
Comparison: Previous exam(s).

CLINICAL DATA: Screening.

EXAM:
DIGITAL SCREENING BILATERAL MAMMOGRAM WITH TOMOSYNTHESIS AND CAD
TECHNIQUE: Bilateral screening digital craniocaudal and mediolateral oblique
mammograms were obtained. Bilateral screening digital breast
tomosynthesis was performed. The images were evaluated with
computer-aided detection.

[L MLO synth-2D]
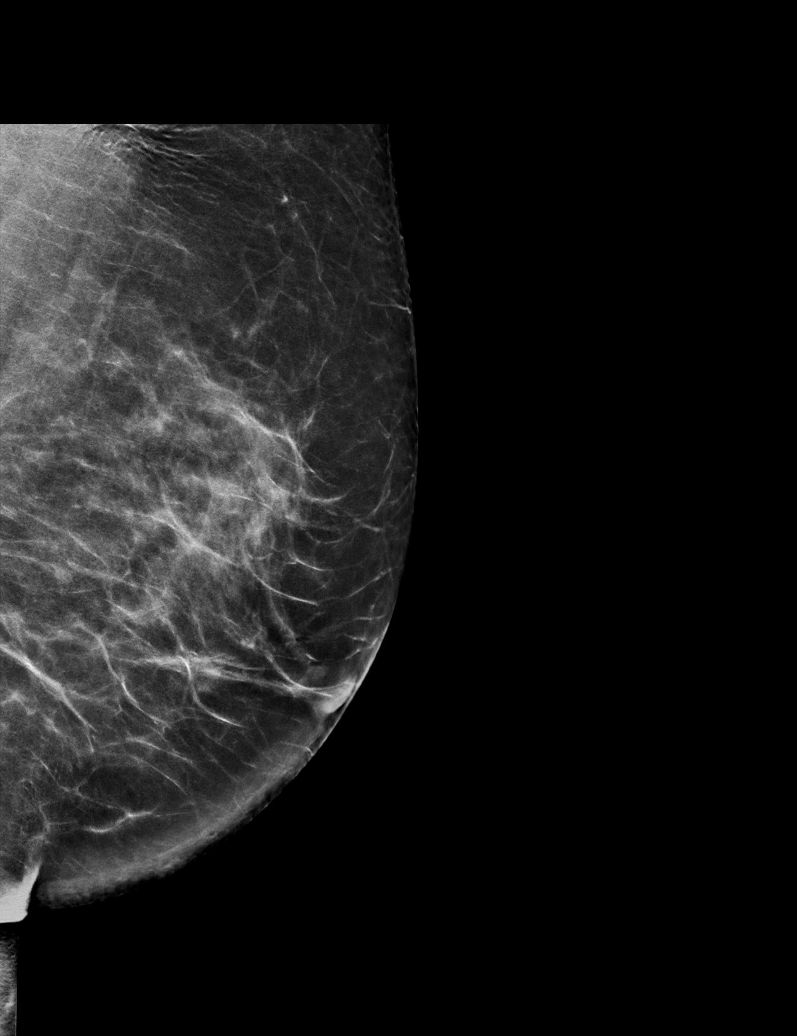

[L CC synth-2D (1 of 2)]
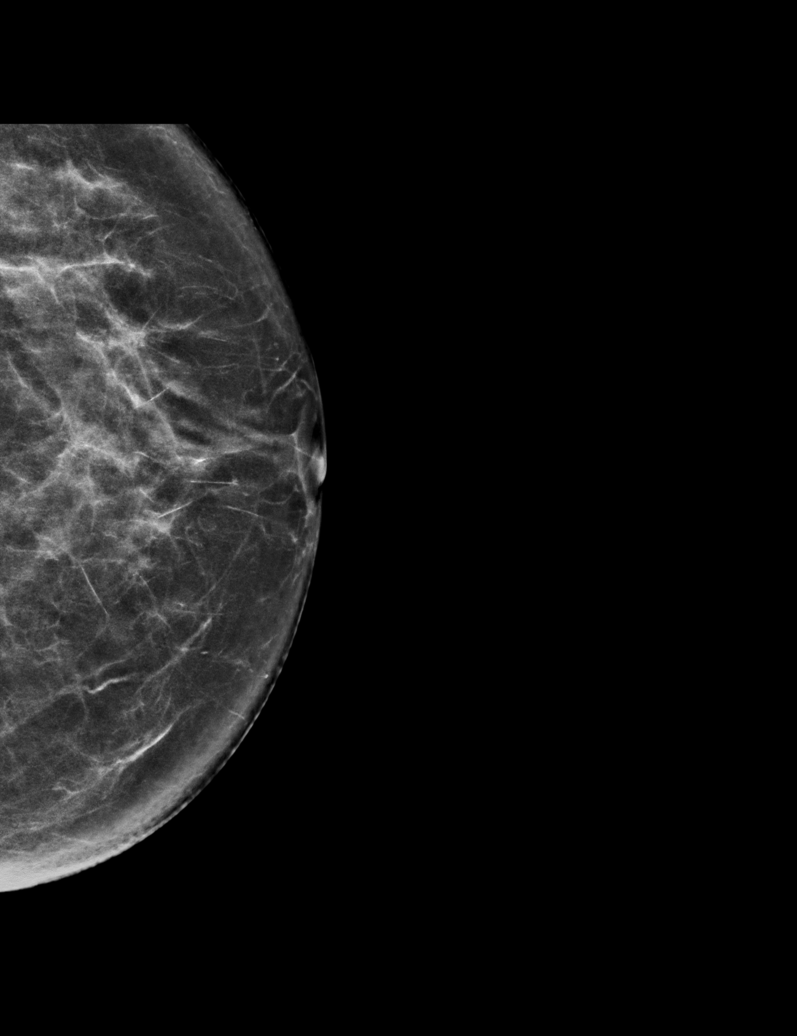

[R MLO synth-2D (1 of 2)]
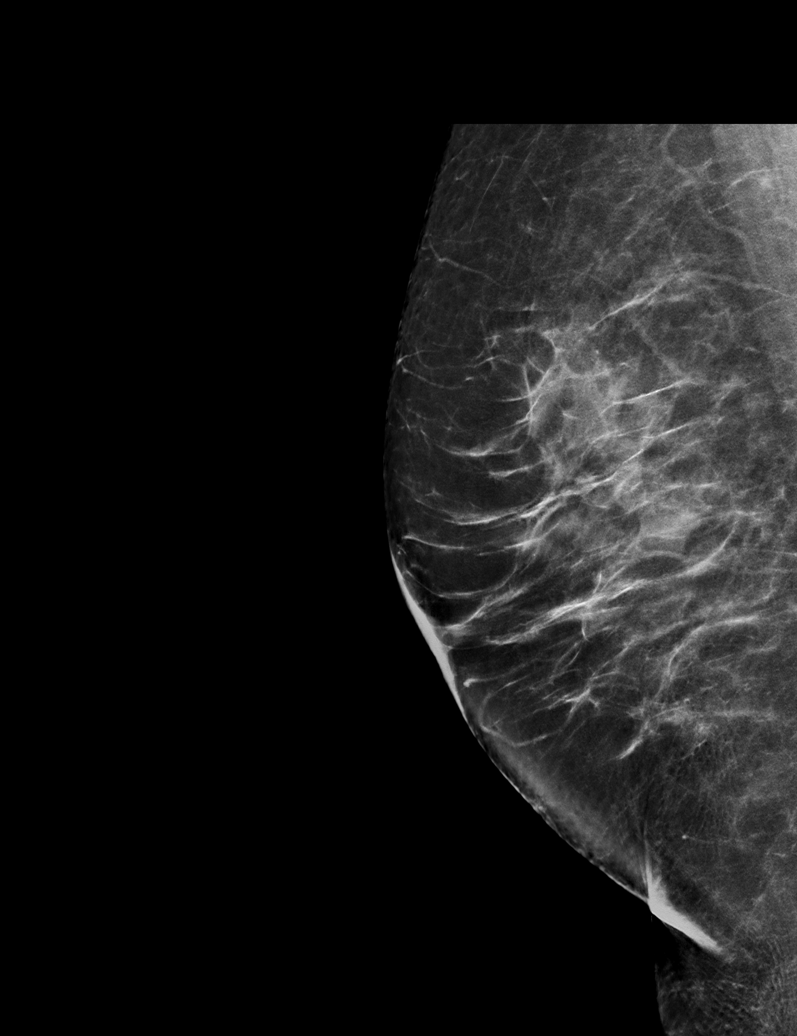

[L CC synth-2D (2 of 2)]
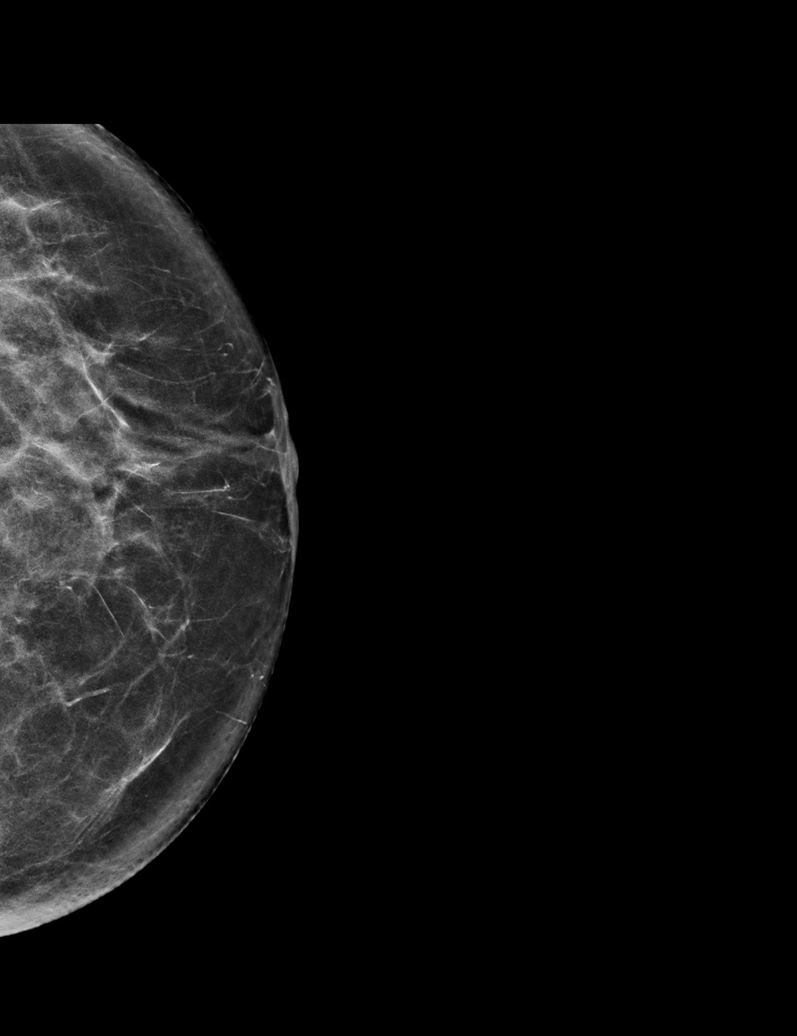

[R MLO synth-2D (2 of 2)]
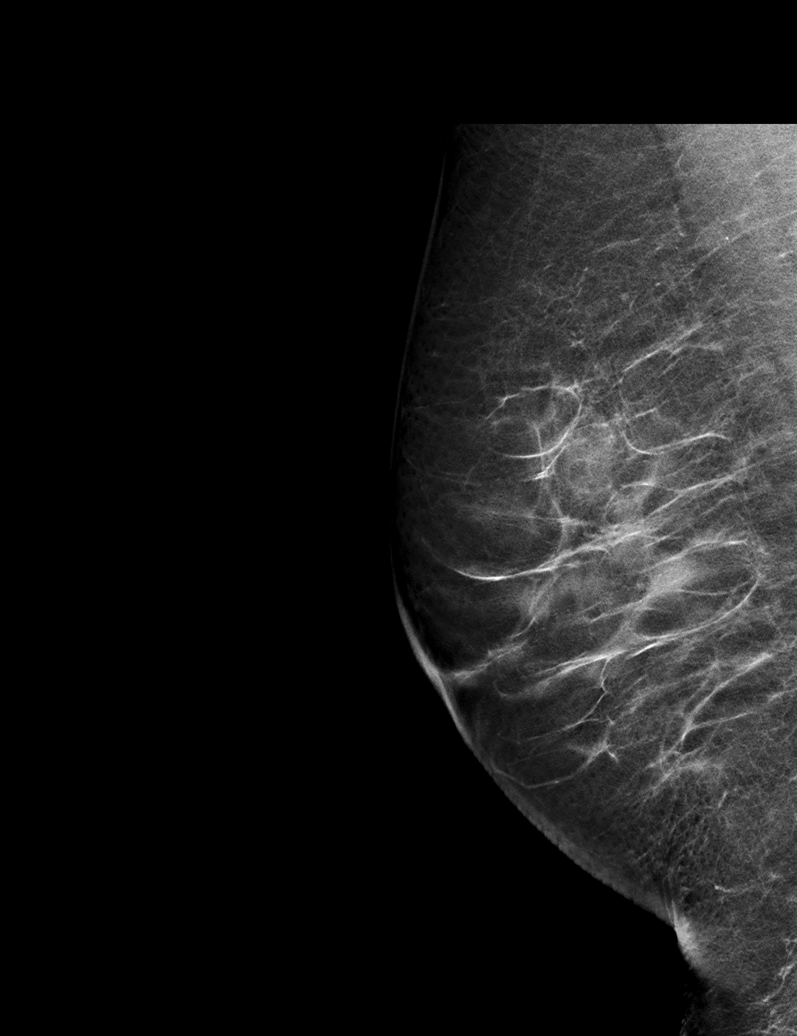

[R CC synth-2D]
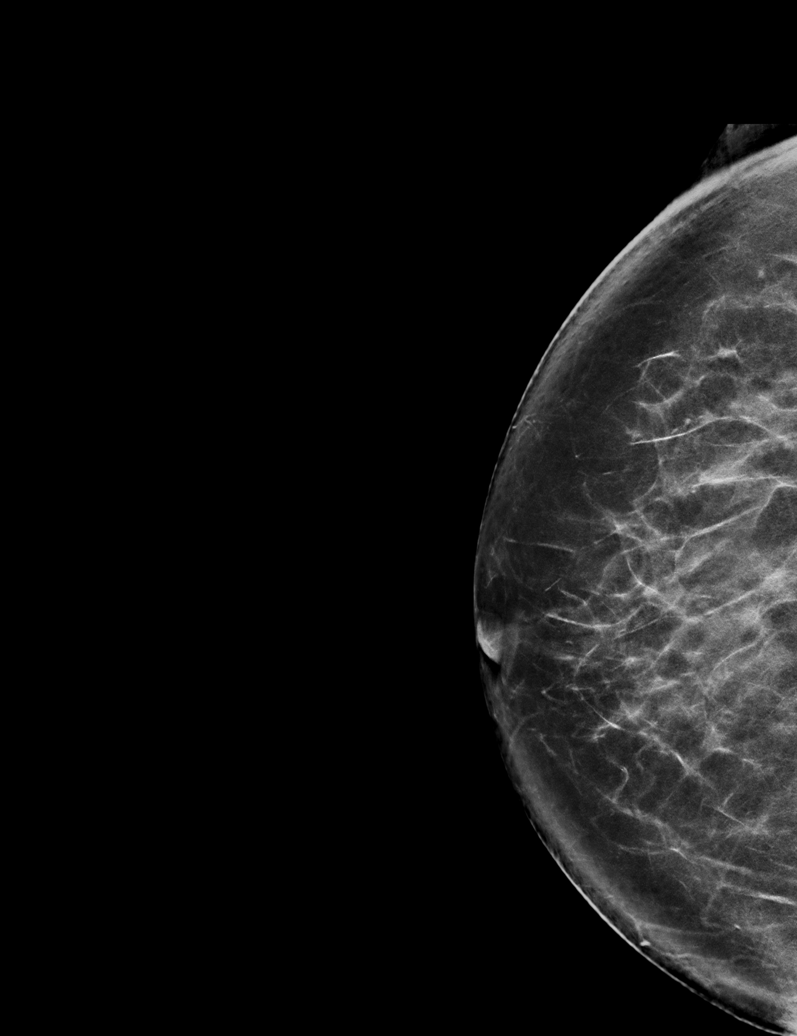

[6 of 36 positions shown; findings below may reference images not displayed]

ACR Breast Density Category c: The breast tissue is heterogeneously
dense, which may obscure small masses.
FINDINGS: There are no findings suspicious for malignancy.
IMPRESSION: No mammographic evidence of malignancy. A result letter of this
screening mammogram will be mailed directly to the patient.

RECOMMENDATION:
Screening mammogram in one year. (Code:Q3-W-BC3)

BI-RADS CATEGORY  1: Negative.

## 2023-11-24 ENCOUNTER — Ambulatory Visit (HOSPITAL_COMMUNITY)
Admission: RE | Admit: 2023-11-24 | Discharge: 2023-11-24 | Disposition: A | Discharge status: Home / Self Care | Source: Ambulatory Visit | Attending: Family Medicine | Admitting: Family Medicine

## 2023-11-24 DIAGNOSIS — R1012 Left upper quadrant pain: Secondary | ICD-10-CM | POA: Diagnosis not present

## 2023-11-24 DIAGNOSIS — I714 Abdominal aortic aneurysm, without rupture, unspecified: Secondary | ICD-10-CM | POA: Diagnosis not present

## 2023-11-25 ENCOUNTER — Other Ambulatory Visit: Payer: Self-pay | Admitting: Cardiovascular Disease

## 2023-11-25 NOTE — Telephone Encounter (Signed)
 Pt last saw Dr Rolm Clos 09/16/23, last labs 06/05/23 Creat 1.60, age 78, weight 72.1kg, based on specified criteria pt is on appropriate dosage of Eliquis  for afib.  Will refill rx.

## 2023-12-10 ENCOUNTER — Inpatient Hospital Stay: Payer: Medicare HMO | Attending: Hematology

## 2023-12-10 ENCOUNTER — Ambulatory Visit (HOSPITAL_COMMUNITY)
Admission: RE | Admit: 2023-12-10 | Discharge: 2023-12-10 | Disposition: A | Discharge status: Home / Self Care | Payer: Medicare HMO | Source: Ambulatory Visit | Attending: Hematology | Admitting: Hematology

## 2023-12-10 ENCOUNTER — Other Ambulatory Visit: Payer: Medicare HMO

## 2023-12-10 DIAGNOSIS — R918 Other nonspecific abnormal finding of lung field: Secondary | ICD-10-CM | POA: Diagnosis not present

## 2023-12-10 DIAGNOSIS — Z902 Acquired absence of lung [part of]: Secondary | ICD-10-CM | POA: Insufficient documentation

## 2023-12-10 DIAGNOSIS — J439 Emphysema, unspecified: Secondary | ICD-10-CM | POA: Diagnosis not present

## 2023-12-10 DIAGNOSIS — N189 Chronic kidney disease, unspecified: Secondary | ICD-10-CM | POA: Diagnosis not present

## 2023-12-10 DIAGNOSIS — Z85118 Personal history of other malignant neoplasm of bronchus and lung: Secondary | ICD-10-CM | POA: Insufficient documentation

## 2023-12-10 DIAGNOSIS — R0609 Other forms of dyspnea: Secondary | ICD-10-CM | POA: Insufficient documentation

## 2023-12-10 DIAGNOSIS — J90 Pleural effusion, not elsewhere classified: Secondary | ICD-10-CM | POA: Diagnosis not present

## 2023-12-10 DIAGNOSIS — Z87891 Personal history of nicotine dependence: Secondary | ICD-10-CM | POA: Diagnosis not present

## 2023-12-10 DIAGNOSIS — C3412 Malignant neoplasm of upper lobe, left bronchus or lung: Secondary | ICD-10-CM | POA: Insufficient documentation

## 2023-12-10 DIAGNOSIS — Z79899 Other long term (current) drug therapy: Secondary | ICD-10-CM | POA: Diagnosis not present

## 2023-12-10 LAB — COMPREHENSIVE METABOLIC PANEL WITH GFR
ALT: 14 U/L (ref 0–44)
AST: 21 U/L (ref 15–41)
Albumin: 3.6 g/dL (ref 3.5–5.0)
Alkaline Phosphatase: 52 U/L (ref 38–126)
Anion gap: 6 (ref 5–15)
BUN: 20 mg/dL (ref 8–23)
CO2: 27 mmol/L (ref 22–32)
Calcium: 9.5 mg/dL (ref 8.9–10.3)
Chloride: 104 mmol/L (ref 98–111)
Creatinine, Ser: 1.64 mg/dL — ABNORMAL HIGH (ref 0.44–1.00)
GFR, Estimated: 32 mL/min — ABNORMAL LOW (ref >60)
Glucose, Bld: 83 mg/dL (ref 70–99)
Potassium: 4.5 mmol/L (ref 3.5–5.1)
Sodium: 137 mmol/L (ref 135–145)
Total Bilirubin: 0.7 mg/dL (ref 0.0–1.2)
Total Protein: 6.7 g/dL (ref 6.5–8.1)

## 2023-12-10 LAB — CBC WITH DIFFERENTIAL/PLATELET
Abs Immature Granulocytes: 0.01 10*3/uL (ref 0.00–0.07)
Basophils Absolute: 0.1 10*3/uL (ref 0.0–0.1)
Basophils Relative: 1 %
Eosinophils Absolute: 0.2 10*3/uL (ref 0.0–0.5)
Eosinophils Relative: 3 %
HCT: 37.5 % (ref 36.0–46.0)
Hemoglobin: 11.6 g/dL — ABNORMAL LOW (ref 12.0–15.0)
Immature Granulocytes: 0 %
Lymphocytes Relative: 19 %
Lymphs Abs: 1.2 10*3/uL (ref 0.7–4.0)
MCH: 27.4 pg (ref 26.0–34.0)
MCHC: 30.9 g/dL (ref 30.0–36.0)
MCV: 88.7 fL (ref 80.0–100.0)
Monocytes Absolute: 0.7 10*3/uL (ref 0.1–1.0)
Monocytes Relative: 11 %
Neutro Abs: 4.1 10*3/uL (ref 1.7–7.7)
Neutrophils Relative %: 66 %
Platelets: 225 10*3/uL (ref 150–400)
RBC: 4.23 MIL/uL (ref 3.87–5.11)
RDW: 16 % — ABNORMAL HIGH (ref 11.5–15.5)
WBC: 6.2 10*3/uL (ref 4.0–10.5)
nRBC: 0 % (ref 0.0–0.2)

## 2023-12-10 MED ORDER — IOHEXOL 300 MG/ML  SOLN
60.0000 mL | Freq: Once | INTRAMUSCULAR | Status: AC | PRN
  Administered 2023-12-10: 60 mL via INTRAVENOUS

## 2023-12-15 ENCOUNTER — Other Ambulatory Visit: Payer: Self-pay | Admitting: Thoracic Surgery (Cardiothoracic Vascular Surgery)

## 2023-12-15 ENCOUNTER — Ambulatory Visit
Payer: Medicare HMO | Attending: Thoracic Surgery (Cardiothoracic Vascular Surgery) | Admitting: Thoracic Surgery (Cardiothoracic Vascular Surgery)

## 2023-12-15 ENCOUNTER — Encounter: Payer: Self-pay | Admitting: Thoracic Surgery (Cardiothoracic Vascular Surgery)

## 2023-12-15 VITALS — BP 163/79 | HR 64 | Resp 20 | Ht 64.0 in | Wt 168.2 lb

## 2023-12-15 DIAGNOSIS — C3492 Malignant neoplasm of unspecified part of left bronchus or lung: Secondary | ICD-10-CM | POA: Diagnosis not present

## 2023-12-15 DIAGNOSIS — Z09 Encounter for follow-up examination after completed treatment for conditions other than malignant neoplasm: Secondary | ICD-10-CM | POA: Diagnosis not present

## 2023-12-15 DIAGNOSIS — I350 Nonrheumatic aortic (valve) stenosis: Secondary | ICD-10-CM

## 2023-12-15 NOTE — Progress Notes (Signed)
 [address]       Courtney Gill [address]             [phone]      HPI: Courtney Gill returns for follow-up after previous left upper lobectomy.  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  Had a endobronchial ultrasound and robotic left upper lobectomy on 04/23/2023.  Final pathology was a T2a, N0, stage Ib adenocarcinoma.  She saw Dr. Cheree Cords.  No adjuvant therapy was indicated.  She says that she feels tired all the time.  She is very concerned about her newly diagnosed abdominal aortic aneurysm.  Still has discomfort along the left costal margin when she coughs, but not with deep breathing.  Past Medical History:  Diagnosis Date   Angioedema 02/2013   Arthritis    Dyspnea    with exertion   GERD (gastroesophageal reflux disease)    Hypercholesterolemia    Hypertension    pt denies this (04/21/23)   Left upper lobe pulmonary nodule    Lung cancer (HCC)    Migraine    Moderate aortic stenosis    Mean gradient 21.5 mmHg, AVA 1.24 cm by echo 03/2023   Peripheral artery disease (HCC)     Current Outpatient Medications  Medication Sig Dispense Refill   albuterol  (VENTOLIN  HFA) 108 (90 Base) MCG/ACT inhaler Inhale 2 puffs into the lungs every 4 (four) hours as needed.     apixaban  (ELIQUIS ) 5 MG TABS tablet TAKE ONE TABLET BY MOUTH TWICE DAILY 60 tablet 5   cetirizine (ZYRTEC) 10 MG tablet Take 10 mg by mouth at bedtime.     Cholecalciferol (VITAMIN D PO) Take 1 tablet by mouth in the morning.     clobetasol  ointment (TEMOVATE ) 0.05 % Apply 1 Application topically 2 (two) times daily. 30 g 3   Cyanocobalamin (VITAMIN B-12 PO) Take 1 tablet by mouth in the morning.     diphenhydrAMINE   (BENADRYL ) 25 MG tablet Take 25 mg by mouth at bedtime.     EPINEPHrine  0.3 mg/0.3 mL IJ SOAJ injection Inject 0.3 mg into the muscle as needed. 2 each 1   MAGNESIUM PO Take 1 capsule by mouth in the morning and at bedtime.     metoprolol  tartrate (LOPRESSOR ) 25 MG tablet Take 0.5 tablets (12.5 mg total) by mouth 2 (two) times daily. 60 tablet 1   Omega-3 Fatty Acids (OMEGA-3 FISH OIL PO) Take 1 capsule by mouth in the morning.     pantoprazole  (PROTONIX ) 40 MG tablet Take 40 mg by mouth daily before breakfast.     Polyethyl Glycol-Propyl Glycol (SYSTANE) 0.4-0.3 % GEL ophthalmic gel Place 1 Application into both eyes at bedtime as needed (dry/irritated eyes.).     rosuvastatin  (CRESTOR ) 40 MG tablet Take 40 mg by mouth at bedtime.     vitamin E 400 UNIT capsule Take 400 Units by mouth in the morning.     Current Facility-Administered Medications  Medication Dose Route Frequency Provider Last Rate Last Admin   ondansetron  (ZOFRAN -ODT) disintegrating tablet 8 mg  8 mg Oral Q8H PRN         Physical Exam BP (!) 163/79 (BP Location: Left Arm, Patient Position: Sitting, Cuff Size: Normal)   Pulse 64   Resp 20  Ht 5\' 4"  (1.626 m)   Wt 168 lb 3.2 oz (76.3 kg)   SpO2 96%   BMI 28.37 kg/m  78 year old woman in no acute distress Alert and oriented x 3 with no focal deficits Lungs diminished on left but otherwise clear Cardiac regular rate and rhythm with 3/6 crescendo-decrescendo systolic murmur  Incisions well-healed No cervical or supraclavicular adenopathy  Diagnostic Tests: CT CHEST WITH CONTRAST   TECHNIQUE: Multidetector CT imaging of the chest was performed during intravenous contrast administration.   RADIATION DOSE REDUCTION: This exam was performed according to the departmental dose-optimization program which includes automated exposure control, adjustment of the mA and/or kV according to patient size and/or use of iterative reconstruction technique.   CONTRAST:  60mL  OMNIPAQUE  IOHEXOL  300 MG/ML  SOLN   COMPARISON:  CT 06/05/2023 and older. X-ray chest 11/16/2023 and older.   FINDINGS: Cardiovascular: Heart is nonenlarged. No pericardial effusion. Coronary artery calcifications are seen. Significant calcifications along the mitral valve annulus. Is also calcifications along the aortic valve. The thoracic aorta is normal course and caliber. Scattered calcified atherosclerotic plaque. There is a variant of a early origin of the left vertebral artery directly from the arch with significant plaque at the origin of the left vertebral artery. Tortuous course to the great vessels.   Mediastinum/Nodes: Preserved thyroid  gland. Slightly patulous thoracic esophagus. Small hiatal hernia suggested. No specific abnormal lymph node enlargement identified in the axillary regions or left hilum. Right hilar node is again seen measuring 10 mm in short axis on image 66. Retrospect this would have measured 11 mm. Once again there are several prominent mediastinal nodes identified. Extent and distribution is similar to previous including prevascular, paratracheal and subcarinal. Example right paratracheal node has short axis of 10 mm on the previous examination and today 9 mm on series 2, image 55.   Lungs/Pleura: Once again centrilobular emphysematous changes are identified along the upper lung zones. Surgical changes seen from left-sided upper lobectomy. The fluid and air in the pleural space in the anterior upper left hemithorax has improved. Some residual fluid. Areas of thickening. Bilateral areas of scarring and fibrotic changes identified. The juxtapleural nodular area in the posterior left lung has improved. Some residual mild pleural thickening and linear changes. No new dominant lung nodule identified. There are some calcified foci identified in the right lung consistent with old granulomatous disease. Stable nodule along the major fissure on image 50  measuring 5 mm. Few other tiny nodules elsewhere are stable. Examples include image 80, 49, 34.   Upper Abdomen: Adrenal glands are preserved in the upper abdomen. Bilateral renal atrophy. Gallbladder is present. Stable small upper retroperitoneal nodes.   Musculoskeletal: Progressive healing of right lateral rib fracture. Curvature and degenerative changes along the spine.   IMPRESSION: Maturing postsurgical changes from left-sided lobectomy. The pleural effusion with air is decreasing. The dependent opacities in the left lower lobe are improving.   Stable prominent mediastinal and right hilar nodes. No new nodal enlargement. No new lung mass.   Few tiny lung nodules identified bilaterally are stable. Simple attention on follow-up.   Healing right-sided rib fractures   Aortic Atherosclerosis (ICD10-I70.0) and Emphysema (ICD10-J43.9).     Electronically Signed   By: Adrianna Horde M.D.   On: 12/11/2023 12:42 I personally reviewed the CT images.  Status post left upper lobectomy.  No change in right hilar or mediastinal nodes.  Aortic atherosclerosis, aortic valve calcification.  Emphysema.  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  Stage Ib adenocarcinoma of the lung-status post left upper lobectomy.  Did not require adjuvant therapy.  Being followed with semiannual CT scans.  Has a follow-up appoint with Dr. Katragadda on Thursday.    Doing well from a surgical standpoint.  She does still have pain in the left costal margin when she coughs but not otherwise..  She complains of feeling tired all the time.  Not so much getting short of breath with activity which is not having energy.  Heart rate has  been a little low.  Sometimes can see that he would low-dose Lopressor .  However I do not want to stop that medication given her hypertension currently.  Advised her to follow-up with Dr. Del Favia and Dr. Lucy Sack.  She does have a prominent murmur on exam and has known moderate aortic stenosis.I am going to order another echocardiogram.    I want to make sure that has not progressed.    Plan: Follow-up with Katragadda as scheduled on Thursday. At this point think she can follow-up with Dr. Cheree Cords.  I am happy to see her back if I can be of any assistance with her care.  Zelphia Higashi, MD [facility] [phone]

## 2023-12-17 ENCOUNTER — Inpatient Hospital Stay: Payer: Medicare HMO | Admitting: Hematology

## 2023-12-17 ENCOUNTER — Encounter: Payer: Self-pay | Admitting: Hematology

## 2023-12-17 VITALS — BP 144/72 | HR 59 | Temp 98.1°F | Resp 16 | Wt 166.0 lb

## 2023-12-17 DIAGNOSIS — Z902 Acquired absence of lung [part of]: Secondary | ICD-10-CM | POA: Diagnosis not present

## 2023-12-17 DIAGNOSIS — Z79899 Other long term (current) drug therapy: Secondary | ICD-10-CM | POA: Diagnosis not present

## 2023-12-17 DIAGNOSIS — Z87891 Personal history of nicotine dependence: Secondary | ICD-10-CM | POA: Diagnosis not present

## 2023-12-17 DIAGNOSIS — C3412 Malignant neoplasm of upper lobe, left bronchus or lung: Secondary | ICD-10-CM

## 2023-12-17 DIAGNOSIS — R0609 Other forms of dyspnea: Secondary | ICD-10-CM | POA: Diagnosis not present

## 2023-12-17 DIAGNOSIS — N189 Chronic kidney disease, unspecified: Secondary | ICD-10-CM | POA: Diagnosis not present

## 2023-12-17 NOTE — Patient Instructions (Addendum)
 [facility] at [facility] Discharge Instructions   You were seen and examined today by Dr. Cheree Cords.  He reviewed the results of your lab work which are normal/stable.   He reviewed the results of your CT scan which is good. There is no evidence of cancer.   Start taking iron tablets over the counter 325 mg daily. You may take with stool softener to prevent constipation.   We will see you back in 6 months. We will repeat lab work and a CT scan prior to this visit.    Return as scheduled.    Thank you for choosing [facility] at [facility] to provide your oncology and hematology care.  To afford each patient quality time with our provider, please arrive at least 15 minutes before your scheduled appointment time.   If you have a lab appointment with the Cancer Center please come in thru the Main Entrance and check in at the main information desk.  You need to re-schedule your appointment should you arrive 10 or more minutes late.  We strive to give you quality time with our providers, and arriving late affects you and other patients whose appointments are after yours.  Also, if you no show three or more times for appointments you may be dismissed from the clinic at the providers discretion.     Again, thank you for choosing [facility].  Our hope is that these requests will decrease the amount of time that you wait before being seen by our physicians.       _____________________________________________________________  Should you have questions after your visit to [facility], please contact our office at [phone] and follow the prompts.  Our office hours are 8:00 a.m. and 4:30 p.m. Monday - Friday.  Please note that voicemails left after 4:00 p.m. may not be returned until the following business day.  We are closed weekends and major holidays.  You do have access to a nurse 24-7, just call the main number to the  clinic [phone] and do not press any options, hold on the line and a nurse will answer the phone.    For prescription refill requests, have your pharmacy contact our office and allow 72 hours.    Due to Covid, you will need to wear a mask upon entering the hospital. If you do not have a mask, a mask will be given to you at the Main Entrance upon arrival. For doctor visits, patients may have 1 support person age 48 or older with them. For treatment visits, patients can not have anyone with them due to social distancing guidelines and our immunocompromised population.

## 2023-12-17 NOTE — Progress Notes (Signed)
 [facility] [address] [place] [address]   Clinic Day:  12/17/2023  Referring physician: Omie Bickers, MD  Patient Care Team: Omie Bickers, MD as PCP - General (Internal Medicine) O'Neal, Cathay Clonts, MD as PCP - Cardiology (Cardiology) Paulett Boros, MD as Medical Oncologist (Medical Oncology) Gerhard Knuckles, RN as Oncology Nurse Navigator (Medical Oncology)   ASSESSMENT & PLAN:   Assessment:  1.  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  Mildly enlarged/prominent mediastinal lymph nodes stable. - Left upper Gill wedge resection and lymph node dissection by Dr. Luna Salinas on 04/23/2023 - Pathology: Adenocarcinoma, acinar (80%): Papillary (10%), solid (5%), lipidic (5%) patterns with focal extracellular mucin, poorly differentiated, tumor size 2.8 cm, carcinoma invades visceral pleura.  Spread through airspaces identified.  Lymphovascular invasion not seen.  0/16 lymph nodes involved at lymph node stations 4L, 5, 7, 8, 9, 10, 11, 12, 13.  pT2 pN0. - NGS: STK 11, T p53, MSI-stable, TMB-low.  Negative for EGFR/ALK  2. Social/Family History: -Lives at home with husband. Independent of ADL's and IADL's prior to lobectomy. 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 Quit tobacco use on 04/22/23 of 1 ppd since age 47. No chemical exposures. -Paternal grandfather and paternal uncle had cancer, type unknown.   Plan:  1.  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  Denies any hemoptysis. - She reports dyspnea on exertion. - Reviewed labs from 12/10/2023: CBC grossly normal.  CKD stable with creatinine 1.6. - CT chest (12/10/2023): Postsurgical changes with stable prominent mediastinal and right hilar lymph nodes.  Few tiny lung nodules are also stable.  No evidence of recurrence. - Recommend follow-up  with CT chest without contrast in 6 months with labs. - She reports dyspnea on exertion.  I have offered consultation with pulmonology which she wants to hold off for now.  She will also follow-up with her cardiologist for aortic stenosis.   Orders Placed This Encounter  Procedures   CT CHEST WO CONTRAST    Standing Status:   Future    Expected Date:   06/18/2024    Expiration Date:   12/16/2024    Preferred imaging location?:   [facility]   CBC with Differential    Standing Status:   Future    Expected Date:   06/13/2024    Expiration Date:   12/16/2024   Iron and TIBC ([facility] ONLY)    Standing Status:   Future    Expected Date:   06/13/2024    Expiration Date:   12/16/2024   Ferritin    Standing Status:   Future    Expected Date:   06/13/2024    Expiration Date:   12/16/2024      Hurman Maiden R Teague,acting as a scribe for Paulett Boros, MD.,have documented all relevant documentation on the behalf of Paulett Boros, MD,as directed by  Paulett Boros, MD while in the presence of Paulett Boros, MD.  I, Paulett Boros MD, have reviewed the above documentation for accuracy and completeness, and I agree with the above.     Paulett Boros, MD   5/15/20253:50 PM  CHIEF COMPLAINT/PURPOSE OF CONSULT:   Diagnosis: Left lung adenocarcinoma   Cancer Staging  Adenocarcinoma of upper Gill of left lung (HCC) Staging form: Lung, AJCC 8th Edition - Clinical  stage from 05/21/2023: cT2, cN0, cM0 - Unsigned    Prior Therapy: Left upper Gill wedge resection and lymph node dissection  Current Therapy: Surveillance   HISTORY OF PRESENT ILLNESS:   Oncology History   No history exists.      Courtney Gill is a 78 y.o. female presenting to clinic today for evaluation of adenocarcinoma of left upper Gill at the request of Dr. Luna Salinas.  Patient had a CT lung cancer screening on 12/20/22 that found: a 19.9 mm irregular nodule in the  anterior left upper Gill, suspicious for primary bronchogenic carcinoma. She then had an initial PET on 02/19/23 that found: hypermetabolic irregular nodule in the anterior left upper Gill measures 2.1 x 1.6 cm, highly suspicious for primary bronchogenic carcinoma; mildly enlarged/prominent mediastinal lymph nodes are stable from chest CT May 2018; and no evidence of FDG avid metastatic disease in the neck, abdomen, or pelvis.   She then underwent an endobronchial US  followed by a left upper lobectomy on 04/23/23 with Dr. Luna Salinas. Surgical pathology of the lymph node aspirations were negative. The resection of the nodule on the left upper Gill of the lung revealed: poorly differentiated adenocarcinoma with focal extracellular mucin. Post-op, she developed A-fib and went home on amiodarone  and Eliquis . She presented to the ED on 05/03/23 for drainage of her wound from the partial lobectomy. I independently viewed and interpreted Chest X-ray that found subtle diffuse interstitial opacity left lung status post left upper lobectomy consistent with edema or possible infection. However, she was asymptomatic and discharged.   Today, she states that she is doing well overall. Her appetite level is at 50%. Her energy level is at 25%.  She states she vomited all of her medication this morning and has had multiple episodes of this since lobectomy on 9/19. She was able to keep down Zofran  this morning. She notes she has also had difficulty keeping down medications containing sulfur in the past. Since lobectomy, her energy levels have gradually improved. She has a follow-up with Dr. Luna Salinas on 06/16/23. After her last visit with Dr. Luna Salinas, she had a fall to her right side that reportedly caused bruising and minor cuts from the right leg to the right upper flank and right forearm. Bruising and cuts have mostly resolved, though she still reports mild soreness on her right ankle and right wrist.   INTERVAL  HISTORY:   Courtney Gill is a 78 y.o. female presenting to the clinic today for follow-up of Left lung adenocarcinoma. She was last seen by me on 06/17/23.  Since her last visit, she underwent CT chest on 12/10/23 that found: Maturing postsurgical changes from left-sided lobectomy. The pleural effusion with air is decreasing. The dependent opacities in the left lower Gill are improving. Stable prominent mediastinal and right hilar nodes. No new nodal enlargement. No new lung mass. Few tiny lung nodules identified bilaterally are stable. Healing right-sided rib fractures  Today, she states that she is doing well overall. Her appetite level is at 100%. Her energy level is at 10%.   PAST MEDICAL HISTORY:   Past Medical History: Past Medical History:  Diagnosis Date   Angioedema 02/2013   Arthritis    Dyspnea    with exertion   GERD (gastroesophageal reflux disease)    Hypercholesterolemia    Hypertension    pt denies this (04/21/23)   Left upper Gill pulmonary nodule    Lung cancer (HCC)    Migraine    Moderate aortic stenosis  Mean gradient 21.5 mmHg, AVA 1.24 cm by echo 03/2023   Peripheral artery disease [facility])     Surgical History: Past Surgical History:  Procedure Laterality Date   CAROTID STENT Right 08/04/2005   DILATION AND CURETTAGE OF UTERUS     EYE SURGERY     "muscle moved"   INTERCOSTAL NERVE BLOCK  04/23/2023   Procedure: INTERCOSTAL NERVE BLOCK;  Surgeon: Zelphia Higashi, MD;  Location: [facility] OR;  Service: Thoracic;;   LYMPH NODE BIOPSY Left 04/23/2023   Procedure: LYMPH NODE BIOPSY;  Surgeon: Zelphia Higashi, MD;  Location: [facility] OR;  Service: Thoracic;  Laterality: Left;   TONSILLECTOMY     in early 20's   VIDEO BRONCHOSCOPY WITH ENDOBRONCHIAL ULTRASOUND N/A 04/23/2023   Procedure: VIDEO BRONCHOSCOPY WITH ENDOBRONCHIAL ULTRASOUND;  Surgeon: Zelphia Higashi, MD;  Location: [facility] OR;  Service: Thoracic;  Laterality: N/A;    Social History: Social History    Socioeconomic History   Marital status: Married    Spouse name: Not on file   Number of children: 1   Years of education: Not on file   Highest education level: Not on file  Occupational History   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  Education HS.  Children 1 son, 4 stepchildren.   Social Drivers of Corporate investment banker Strain: Not on file  Food Insecurity: No Food Insecurity (04/23/2023)   Hunger Vital Sign    Worried About Running Out of Food in the Last Year: Never true    Ran Out of Food in the Last Year: Never true  Transportation Needs: No Transportation Needs (04/23/2023)   PRAPARE - Administrator, Civil Service (Medical): No    Lack of Transportation (Non-Medical): No  Physical Activity: Not on file  Stress: Not on file  Social Connections: Not on file  Intimate Partner Violence: Not At Risk (04/23/2023)   Humiliation, Afraid, Rape, and Kick questionnaire    Fear of Current or Ex-Partner: No    Emotionally Abused: No    Physically Abused: No    Sexually Abused: No    Family History: Family History  Problem Relation Age of Onset   Atrial fibrillation Mother    Diabetes Maternal Grandfather    Cancer Paternal Grandfather     Current Medications:  Current Outpatient Medications:    albuterol  (VENTOLIN  HFA) 108 (90 Base) MCG/ACT inhaler, Inhale 2 puffs into the lungs every 4 (four) hours as needed., Disp: , Rfl:    apixaban  (ELIQUIS ) 5 MG TABS tablet, TAKE ONE TABLET BY MOUTH TWICE DAILY, Disp: 60 tablet, Rfl: 5    cetirizine (ZYRTEC) 10 MG tablet, Take 10 mg by mouth at bedtime., Disp: , Rfl:    Cholecalciferol (VITAMIN D PO), Take 1 tablet by mouth in the morning., Disp: , Rfl:    clobetasol  ointment (TEMOVATE ) 0.05 %, Apply 1 Application topically 2 (two) times daily., Disp: 30 g, Rfl: 3   Cyanocobalamin (VITAMIN B-12  PO), Take 1 tablet by mouth in the morning., Disp: , Rfl:    diphenhydrAMINE  (BENADRYL ) 25 MG tablet, Take 25 mg by mouth at bedtime., Disp: , Rfl:    EPINEPHrine  0.3 mg/0.3 mL IJ SOAJ injection, Inject 0.3 mg into the muscle as needed., Disp: 2 each, Rfl: 1   MAGNESIUM PO, Take 1 capsule by mouth in the morning and at bedtime., Disp: , Rfl:    metoprolol  tartrate (LOPRESSOR ) 25 MG tablet, Take 0.5 tablets (12.5 mg total) by mouth 2 (two) times daily., Disp: 60 tablet, Rfl: 1   Omega-3 Fatty Acids (OMEGA-3 FISH OIL PO), Take 1 capsule by mouth in the morning., Disp: , Rfl:    pantoprazole  (PROTONIX ) 40 MG tablet, Take 40 mg by mouth daily before breakfast., Disp: , Rfl:    Polyethyl Glycol-Propyl Glycol (SYSTANE) 0.4-0.3 % GEL ophthalmic gel, Place 1 Application into both eyes at bedtime as needed (dry/irritated eyes.)., Disp: , Rfl:    rosuvastatin  (CRESTOR ) 40 MG tablet, Take 40 mg by mouth at bedtime., Disp: , Rfl:    vitamin E 400 UNIT capsule, Take 400 Units by mouth in the morning., Disp: , Rfl:   Current Facility-Administered Medications:    ondansetron  (ZOFRAN -ODT) disintegrating tablet 8 mg, 8 mg, Oral, Q8H PRN,    Allergies: Allergies  Allergen Reactions   Bee Venom Anaphylaxis   Wasp Venom Anaphylaxis   Aspirin Swelling    REVIEW OF SYSTEMS:   Review of Systems  Constitutional:  Positive for fatigue. Negative for chills and fever.  HENT:   Negative for lump/mass, mouth sores, nosebleeds, sore throat and trouble swallowing.   Eyes:  Negative for eye problems.  Respiratory:  Positive for shortness of breath. Negative for cough.   Cardiovascular:  Negative for chest  pain, leg swelling and palpitations.  Gastrointestinal:  Negative for abdominal pain, constipation, diarrhea, nausea and vomiting.  Genitourinary:  Negative for bladder incontinence, difficulty urinating, dysuria, frequency, hematuria and nocturia.   Musculoskeletal:  Negative for arthralgias, back pain, flank pain, myalgias and neck pain.  Skin:  Negative for itching and rash.  Neurological:  Negative for dizziness, headaches and numbness.  Hematological:  Does not bruise/bleed easily.  Psychiatric/Behavioral:  Negative for depression, sleep disturbance and suicidal ideas. The patient is not nervous/anxious.   All other systems reviewed and are negative.    VITALS:   Blood pressure (!) 144/72, pulse (!) 59, temperature 98.1 F (36.7 C), temperature source Oral, resp. rate 16, weight 166 lb 0.1 oz (75.3 kg), SpO2 98%.  Wt Readings from Last 3 Encounters:  12/17/23 166 lb 0.1 oz (75.3 kg)  12/15/23 168 lb 3.2 oz (76.3 kg)  09/18/23 159 lb (72.1 kg)    Body mass index is 28.49 kg/m.  Performance status (ECOG): 1 - Symptomatic but completely ambulatory  PHYSICAL EXAM:   Physical Exam Vitals and nursing note reviewed. Exam conducted with a chaperone present.  Constitutional:      Appearance: Normal appearance.  Cardiovascular:     Rate and Rhythm: Normal rate and regular rhythm.     Pulses: Normal pulses.     Heart sounds: Murmur heard.  Pulmonary:     Effort: Pulmonary effort is normal.     Breath sounds: Normal breath sounds.  Abdominal:     Palpations: Abdomen is soft. There is no hepatomegaly, splenomegaly or mass.     Tenderness: There is no abdominal tenderness.  Musculoskeletal:     Right lower leg: No edema.  Left lower leg: No edema.  Lymphadenopathy:     Cervical: No cervical adenopathy.     Right cervical: No superficial, deep or posterior cervical adenopathy.    Left cervical: No superficial, deep or posterior cervical adenopathy.     Upper Body:     Right  upper body: No supraclavicular or axillary adenopathy.     Left upper body: No supraclavicular or axillary adenopathy.  Neurological:     General: No focal deficit present.     Mental Status: She is alert and oriented to person, place, and time.  Psychiatric:        Mood and Affect: Mood normal.        Behavior: Behavior normal.     LABS:      Latest Ref Rng & Units 12/10/2023    2:30 PM 05/03/2023   12:29 PM 04/25/2023    2:36 AM  CBC  WBC 4.0 - 10.5 K/uL 6.2  10.4  10.5   Hemoglobin 12.0 - 15.0 g/dL 40.9  81.1  91.4   Hematocrit 36.0 - 46.0 % 37.5  41.4  35.9   Platelets 150 - 400 K/uL 225  343  188       Latest Ref Rng & Units 12/10/2023    2:30 PM 06/05/2023    1:42 PM 05/03/2023   12:29 PM  CMP  Glucose 70 - 99 mg/dL 83   782   BUN 8 - 23 mg/dL 20   17   Creatinine 9.56 - 1.00 mg/dL 2.13  0.86  5.78   Sodium 135 - 145 mmol/L 137   137   Potassium 3.5 - 5.1 mmol/L 4.5   3.7   Chloride 98 - 111 mmol/L 104   101   CO2 22 - 32 mmol/L 27   23   Calcium  8.9 - 10.3 mg/dL 9.5   9.2   Total Protein 6.5 - 8.1 g/dL 6.7   6.7   Total Bilirubin 0.0 - 1.2 mg/dL 0.7   0.6   Alkaline Phos 38 - 126 U/L 52   71   AST 15 - 41 U/L 21   21   ALT 0 - 44 U/L 14   22      No results found for: "CEA1", "CEA" / No results found for: "CEA1", "CEA" No results found for: "PSA1" No results found for: "ION629" No results found for: "CAN125"  No results found for: "TOTALPROTELP", "ALBUMINELP", "A1GS", "A2GS", "BETS", "BETA2SER", "GAMS", "MSPIKE", "SPEI" No results found for: "TIBC", "FERRITIN", "IRONPCTSAT" No results found for: "LDH"   STUDIES:   CT Chest W Contrast Result Date: 12/11/2023 CLINICAL DATA:  Staging adenocarcinoma left upper Gill. * Tracking Code: BO * EXAM: CT CHEST WITH CONTRAST TECHNIQUE: Multidetector CT imaging of the chest was performed during intravenous contrast administration. RADIATION DOSE REDUCTION: This exam was performed according to the departmental  dose-optimization program which includes automated exposure control, adjustment of the mA and/or kV according to patient size and/or use of iterative reconstruction technique. CONTRAST:  60mL OMNIPAQUE  IOHEXOL  300 MG/ML  SOLN COMPARISON:  CT 06/05/2023 and older. X-ray chest 11/16/2023 and older. FINDINGS: Cardiovascular: Heart is nonenlarged. No pericardial effusion. Coronary artery calcifications are seen. Significant calcifications along the mitral valve annulus. Is also calcifications along the aortic valve. The thoracic aorta is normal course and caliber. Scattered calcified atherosclerotic plaque. There is a variant of a early origin of the left vertebral artery directly from the arch with significant plaque at the origin of the  left vertebral artery. Tortuous course to the great vessels. Mediastinum/Nodes: Preserved thyroid  gland. Slightly patulous thoracic esophagus. Small hiatal hernia suggested. No specific abnormal lymph node enlargement identified in the axillary regions or left hilum. Right hilar node is again seen measuring 10 mm in short axis on image 66. Retrospect this would have measured 11 mm. Once again there are several prominent mediastinal nodes identified. Extent and distribution is similar to previous including prevascular, paratracheal and subcarinal. Example right paratracheal node has short axis of 10 mm on the previous examination and today 9 mm on series 2, image 55. Lungs/Pleura: Once again centrilobular emphysematous changes are identified along the upper lung zones. Surgical changes seen from left-sided upper lobectomy. The fluid and air in the pleural space in the anterior upper left hemithorax has improved. Some residual fluid. Areas of thickening. Bilateral areas of scarring and fibrotic changes identified. The juxtapleural nodular area in the posterior left lung has improved. Some residual mild pleural thickening and linear changes. No new dominant lung nodule identified. There  are some calcified foci identified in the right lung consistent with old granulomatous disease. Stable nodule along the major fissure on image 50 measuring 5 mm. Few other tiny nodules elsewhere are stable. Examples include image 80, 49, 34. Upper Abdomen: Adrenal glands are preserved in the upper abdomen. Bilateral renal atrophy. Gallbladder is present. Stable small upper retroperitoneal nodes. Musculoskeletal: Progressive healing of right lateral rib fracture. Curvature and degenerative changes along the spine. IMPRESSION: Maturing postsurgical changes from left-sided lobectomy. The pleural effusion with air is decreasing. The dependent opacities in the left lower Gill are improving. Stable prominent mediastinal and right hilar nodes. No new nodal enlargement. No new lung mass. Few tiny lung nodules identified bilaterally are stable. Simple attention on follow-up. Healing right-sided rib fractures Aortic Atherosclerosis (ICD10-I70.0) and Emphysema (ICD10-J43.9). Electronically Signed   By: Adrianna Horde M.D.   On: 12/11/2023 12:42   US  Abdomen Complete Result Date: 11/25/2023 EXAM: COMPLETE ABDOMINAL ULTRASOUND TECHNIQUE: Real-time ultrasonography of the abdomen was performed. COMPARISON: Comparison CT abdomen/pelvis dated 03/20/2022 and PET CT dated 02/19/2023. CLINICAL HISTORY: Left upper quadrant pain. FINDINGS: LIVER: The liver demonstrates normal echogenicity. No evidence of intrahepatic biliary ductal dilatation. No evidence of mass. BILIARY SYSTEM: No evidence of pericholecystic fluid or wall thickening. No evidence of cholelithiasis. Negative sonographic Murphy's sign. Common bile duct is within normal limits measuring 4 mm. KIDNEYS: The right kidney measures 9.5 cm and the left kidney measures 9.6 cm. The kidneys are unremarkable in appearance without evidence of hydronephrosis. PANCREAS: Visualized portions of the pancreas are unremarkable. SPLEEN: The spleen measures 10.1 cm. No acute abnormality.  IVC: The IVC is patent. AORTA: A 3.6 x 3.5 cm distal abdominal aortic aneurysm is present, previously 3.3 x 3.9 cm on PET CT. According to ACR 2013 and SVS 2018 guidelines, the finding is consistent with an abdominal aortic aneurysm measuring 3.6 x 3.5 cm, and the recommendation is surveillance with imaging follow-up every 2 years (ACR) or every 3 years (SVS). OTHER: No evidence of ascites. IMPRESSION: 1. No acute findings related to left upper quadrant pain. 2. 3.6x3.5 cm distal abdominal aortic aneurysm, with recommendations for surveillance imaging follow-up every 2 years (ACR) or every 3 years (SVS). Electronically signed by: Zadie Herter MD 11/25/2023 12:56 AM EDT RP Workstation: [id number]

## 2024-01-18 DIAGNOSIS — R809 Proteinuria, unspecified: Secondary | ICD-10-CM | POA: Diagnosis not present

## 2024-01-18 DIAGNOSIS — N183 Chronic kidney disease, stage 3 unspecified: Secondary | ICD-10-CM | POA: Diagnosis not present

## 2024-01-19 LAB — LAB REPORT - SCANNED
Albumin, Urine POC: 56.3
Creatinine, POC: 143.5 mg/dL
EGFR: 35
Microalb Creat Ratio: 39

## 2024-01-20 ENCOUNTER — Ambulatory Visit (HOSPITAL_COMMUNITY)
Admission: RE | Admit: 2024-01-20 | Discharge: 2024-01-20 | Disposition: A | Discharge status: Home / Self Care | Source: Ambulatory Visit | Attending: Thoracic Surgery (Cardiothoracic Vascular Surgery) | Admitting: Thoracic Surgery (Cardiothoracic Vascular Surgery)

## 2024-01-20 ENCOUNTER — Telehealth: Payer: Self-pay | Admitting: Cardiovascular Disease

## 2024-01-20 DIAGNOSIS — I350 Nonrheumatic aortic (valve) stenosis: Secondary | ICD-10-CM | POA: Diagnosis not present

## 2024-01-20 LAB — ECHOCARDIOGRAM COMPLETE
AR max vel: 1.14 cm2
AV Area VTI: 1.08 cm2
AV Area mean vel: 1.03 cm2
AV Mean grad: 25.8 mmHg
AV Peak grad: 43.6 mmHg
Ao pk vel: 3.3 m/s
Area-P 1/2: 4.21 cm2
S' Lateral: 2.1 cm

## 2024-01-20 MED ORDER — METOPROLOL TARTRATE 25 MG PO TABS: 12.5000 mg | ORAL_TABLET | Freq: Two times a day (BID) | ORAL | 2 refills | Status: AC

## 2024-01-20 NOTE — Progress Notes (Signed)
*  PRELIMINARY RESULTS* Echocardiogram 2D Echocardiogram has been performed.  Courtney Gill 01/20/2024, 11:46 AM

## 2024-01-20 NOTE — Telephone Encounter (Signed)
*  STAT* If patient is at the pharmacy, call can be transferred to refill team.   1. Which medications need to be refilled? (please list name of each medication and dose if known)   metoprolol  tartrate (LOPRESSOR ) 25 MG tablet   2. Would you like to learn more about the convenience, safety, & potential cost savings by using the [facility]?   3. Are you open to using the [facility] (Type [facility]. ).  4. Which pharmacy/location (including street and city if local pharmacy) is medication to be sent to?  [facility] - [place], [place] - [address]   5. Do they need a 30 day or 90 day supply?   60 day  Patient stated she is completely out of this medication.

## 2024-01-20 NOTE — Telephone Encounter (Signed)
 Pt's medication was sent to pt's pharmacy as requested. Confirmation received.

## 2024-01-22 DIAGNOSIS — E782 Mixed hyperlipidemia: Secondary | ICD-10-CM | POA: Diagnosis not present

## 2024-01-22 DIAGNOSIS — I129 Hypertensive chronic kidney disease with stage 1 through stage 4 chronic kidney disease, or unspecified chronic kidney disease: Secondary | ICD-10-CM | POA: Diagnosis not present

## 2024-01-22 DIAGNOSIS — E860 Dehydration: Secondary | ICD-10-CM | POA: Diagnosis not present

## 2024-01-22 DIAGNOSIS — I10 Essential (primary) hypertension: Secondary | ICD-10-CM | POA: Diagnosis not present

## 2024-01-22 DIAGNOSIS — R5383 Other fatigue: Secondary | ICD-10-CM | POA: Diagnosis not present

## 2024-01-22 DIAGNOSIS — Z87891 Personal history of nicotine dependence: Secondary | ICD-10-CM | POA: Diagnosis not present

## 2024-01-22 DIAGNOSIS — R809 Proteinuria, unspecified: Secondary | ICD-10-CM | POA: Diagnosis not present

## 2024-01-22 DIAGNOSIS — I714 Abdominal aortic aneurysm, without rupture, unspecified: Secondary | ICD-10-CM | POA: Diagnosis not present

## 2024-01-22 DIAGNOSIS — N183 Chronic kidney disease, stage 3 unspecified: Secondary | ICD-10-CM | POA: Diagnosis not present

## 2024-01-22 DIAGNOSIS — R011 Cardiac murmur, unspecified: Secondary | ICD-10-CM | POA: Diagnosis not present

## 2024-01-22 DIAGNOSIS — T7800XD Anaphylactic reaction due to unspecified food, subsequent encounter: Secondary | ICD-10-CM | POA: Diagnosis not present

## 2024-01-22 DIAGNOSIS — C3492 Malignant neoplasm of unspecified part of left bronchus or lung: Secondary | ICD-10-CM | POA: Diagnosis not present

## 2024-02-16 DIAGNOSIS — N183 Chronic kidney disease, stage 3 unspecified: Secondary | ICD-10-CM | POA: Diagnosis not present

## 2024-02-19 ENCOUNTER — Encounter: Payer: Self-pay | Admitting: Advanced Practice Midwife

## 2024-02-19 DIAGNOSIS — N1832 Chronic kidney disease, stage 3b: Secondary | ICD-10-CM | POA: Diagnosis not present

## 2024-02-19 DIAGNOSIS — J449 Chronic obstructive pulmonary disease, unspecified: Secondary | ICD-10-CM | POA: Diagnosis not present

## 2024-02-19 DIAGNOSIS — R197 Diarrhea, unspecified: Secondary | ICD-10-CM | POA: Diagnosis not present

## 2024-02-19 DIAGNOSIS — I10 Essential (primary) hypertension: Secondary | ICD-10-CM | POA: Diagnosis not present

## 2024-02-19 DIAGNOSIS — I129 Hypertensive chronic kidney disease with stage 1 through stage 4 chronic kidney disease, or unspecified chronic kidney disease: Secondary | ICD-10-CM | POA: Diagnosis not present

## 2024-03-11 DIAGNOSIS — N184 Chronic kidney disease, stage 4 (severe): Secondary | ICD-10-CM | POA: Diagnosis not present

## 2024-03-11 DIAGNOSIS — I5032 Chronic diastolic (congestive) heart failure: Secondary | ICD-10-CM | POA: Diagnosis not present

## 2024-03-11 DIAGNOSIS — G4733 Obstructive sleep apnea (adult) (pediatric): Secondary | ICD-10-CM | POA: Diagnosis not present

## 2024-03-11 DIAGNOSIS — I129 Hypertensive chronic kidney disease with stage 1 through stage 4 chronic kidney disease, or unspecified chronic kidney disease: Secondary | ICD-10-CM | POA: Diagnosis not present

## 2024-06-13 ENCOUNTER — Ambulatory Visit (HOSPITAL_COMMUNITY)
Admission: RE | Admit: 2024-06-13 | Discharge: 2024-06-13 | Disposition: A | Discharge status: Home / Self Care | Source: Ambulatory Visit | Attending: Hematology | Admitting: Hematology

## 2024-06-13 ENCOUNTER — Inpatient Hospital Stay: Attending: Oncology

## 2024-06-13 DIAGNOSIS — D509 Iron deficiency anemia, unspecified: Secondary | ICD-10-CM | POA: Diagnosis not present

## 2024-06-13 DIAGNOSIS — I700 Atherosclerosis of aorta: Secondary | ICD-10-CM | POA: Diagnosis not present

## 2024-06-13 DIAGNOSIS — C3412 Malignant neoplasm of upper lobe, left bronchus or lung: Secondary | ICD-10-CM | POA: Diagnosis not present

## 2024-06-13 DIAGNOSIS — Z79899 Other long term (current) drug therapy: Secondary | ICD-10-CM | POA: Diagnosis not present

## 2024-06-13 DIAGNOSIS — Z87891 Personal history of nicotine dependence: Secondary | ICD-10-CM | POA: Diagnosis not present

## 2024-06-13 LAB — CBC WITH DIFFERENTIAL/PLATELET
Abs Immature Granulocytes: 0.01 K/uL (ref 0.00–0.07)
Basophils Absolute: 0.1 K/uL (ref 0.0–0.1)
Basophils Relative: 1 %
Eosinophils Absolute: 0.2 K/uL (ref 0.0–0.5)
Eosinophils Relative: 3 %
HCT: 35.9 % — ABNORMAL LOW (ref 36.0–46.0)
Hemoglobin: 10.8 g/dL — ABNORMAL LOW (ref 12.0–15.0)
Immature Granulocytes: 0 %
Lymphocytes Relative: 23 %
Lymphs Abs: 1.3 K/uL (ref 0.7–4.0)
MCH: 27.1 pg (ref 26.0–34.0)
MCHC: 30.1 g/dL (ref 30.0–36.0)
MCV: 90 fL (ref 80.0–100.0)
Monocytes Absolute: 0.5 K/uL (ref 0.1–1.0)
Monocytes Relative: 10 %
Neutro Abs: 3.4 K/uL (ref 1.7–7.7)
Neutrophils Relative %: 63 %
Platelets: 171 K/uL (ref 150–400)
RBC: 3.99 MIL/uL (ref 3.87–5.11)
RDW: 16.1 % — ABNORMAL HIGH (ref 11.5–15.5)
WBC: 5.5 K/uL (ref 4.0–10.5)
nRBC: 0 % (ref 0.0–0.2)

## 2024-06-13 LAB — FERRITIN: Ferritin: 11 ng/mL (ref 11–307)

## 2024-06-13 LAB — IRON AND TIBC
Iron: 42 ug/dL (ref 28–170)
Saturation Ratios: 8 % — ABNORMAL LOW (ref 10.4–31.8)
TIBC: 525 ug/dL — ABNORMAL HIGH (ref 250–450)
UIBC: 483 ug/dL

## 2024-06-17 DIAGNOSIS — N184 Chronic kidney disease, stage 4 (severe): Secondary | ICD-10-CM | POA: Diagnosis not present

## 2024-06-17 DIAGNOSIS — I129 Hypertensive chronic kidney disease with stage 1 through stage 4 chronic kidney disease, or unspecified chronic kidney disease: Secondary | ICD-10-CM | POA: Diagnosis not present

## 2024-06-17 DIAGNOSIS — I5032 Chronic diastolic (congestive) heart failure: Secondary | ICD-10-CM | POA: Diagnosis not present

## 2024-06-17 DIAGNOSIS — G4733 Obstructive sleep apnea (adult) (pediatric): Secondary | ICD-10-CM | POA: Diagnosis not present

## 2024-06-20 ENCOUNTER — Ambulatory Visit: Admitting: Hematology

## 2024-06-21 ENCOUNTER — Inpatient Hospital Stay: Admitting: Oncology

## 2024-06-21 VITALS — BP 140/69 | HR 62 | Temp 97.6°F | Resp 16 | Wt 177.2 lb

## 2024-06-21 DIAGNOSIS — C3412 Malignant neoplasm of upper lobe, left bronchus or lung: Secondary | ICD-10-CM

## 2024-06-21 DIAGNOSIS — D500 Iron deficiency anemia secondary to blood loss (chronic): Secondary | ICD-10-CM | POA: Insufficient documentation

## 2024-06-21 DIAGNOSIS — D509 Iron deficiency anemia, unspecified: Secondary | ICD-10-CM | POA: Diagnosis not present

## 2024-06-21 DIAGNOSIS — Z87891 Personal history of nicotine dependence: Secondary | ICD-10-CM | POA: Diagnosis not present

## 2024-06-21 DIAGNOSIS — Z79899 Other long term (current) drug therapy: Secondary | ICD-10-CM | POA: Diagnosis not present

## 2024-06-21 NOTE — Progress Notes (Signed)
 [facility] [address], [place] [address]   Clinic Day:  06/21/2024  Referring physician: Shona Norleen PEDLAR, MD  Patient Care Team: Shona Norleen PEDLAR, MD as PCP - General (Internal Medicine) O'Neal, Darryle Ned, MD as PCP - Cardiology (Cardiology) Celestia Joesph SQUIBB, RN as Oncology Nurse Navigator (Medical Oncology)   ASSESSMENT & PLAN:   Assessment:  1.  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  Mildly enlarged/prominent mediastinal lymph nodes stable. - Left upper lobe wedge resection and lymph node dissection by Dr. Kerrin on 04/23/2023 - Pathology: Adenocarcinoma, acinar (80%): Papillary (10%), solid (5%), lipidic (5%) patterns with focal extracellular mucin, poorly differentiated, tumor size 2.8 cm, carcinoma invades visceral pleura.  Spread through airspaces identified.  Lymphovascular invasion not seen.  0/16 lymph nodes involved at lymph node stations 4L, 5, 7, 8, 9, 10, 11, 12, 13.  pT2 pN0. - NGS: STK 11, T p53, MSI-stable, TMB-low.  Negative for EGFR/ALK  2. Social/Family History: -Lives at home with husband. Independent of ADL's and IADL's prior to lobectomy. 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 Quit tobacco use on 04/22/23 of 1 ppd since age 70. No chemical exposures. -Paternal grandfather and paternal uncle had cancer, type unknown.   Plan:  1.  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  Denies any hemoptysis. - She reports dyspnea on exertion. - Reviewed labs from 06/13/2024 are grossly unremarkable. - CT chest 06/13/2024 showed no evidence of recurrent tumor or new pulmonary lesions, stable mediastinal and hilar lymph nodes and no evidence of metastatic disease. - Recommend follow-up with CT chest without contrast in 6 months with labs. - She reports dyspnea on exertion.  I have  offered consultation with pulmonology which she wants to hold off for now.  She will also follow-up with her cardiologist for aortic stenosis.  2.  Iron deficiency anemia: -Labs from 06/13/2024 show worsening anemia with a hemoglobin of 10.8/hematocrit 35.9, ferritin 11 and iron saturation 8%.  TIBC elevated 525. -She is currently not on iron supplements.  She denies any bright red blood per rectum. -She believes she has had a colonoscopy in the past. -She was referred to gastroenterology by nephrology whom she saw last week.  She has not received a phone call yet.  I encouraged her to reach out if she does not hear back from GI in the next few weeks. -We discussed that she would likely need a colonoscopy/EDG. -In the meantime recommend IV iron.  Based on insurance, she will get 1 dose of INFeD. -Return to clinic in 3 months with labs to review.  -Will hold off on oral iron at this time.   Orders Placed This Encounter  Procedures   CT CHEST WO CONTRAST    Standing Status:   Future    Expected Date:   12/22/2024    Expiration Date:   06/21/2025    Preferred imaging location?:   [facility]   Ferritin    Standing Status:   Future    Expected Date:   09/21/2024    Expiration Date:   12/20/2024   CBC with Differential/Platelet    Standing Status:   Future    Expected Date:   09/21/2024    Expiration Date:   12/20/2024   Copper, serum    Standing Status:  Future    Expected Date:   09/21/2024    Expiration Date:   12/20/2024   Vitamin B12    Standing Status:   Future    Expected Date:   09/21/2024    Expiration Date:   12/20/2024   Methylmalonic acid, serum    Standing Status:   Future    Expected Date:   09/21/2024    Expiration Date:   12/20/2024   Iron and TIBC    Standing Status:   Future    Expected Date:   09/21/2024    Expiration Date:   12/20/2024   Folate    Standing Status:   Future    Expected Date:   09/21/2024    Expiration Date:   12/20/2024    I spent 25 minutes  dedicated to the care of this patient (face-to-face and non-face-to-face) on the date of the encounter to include what is described in the assessment and plan.   Delon FORBES Hope, NP   11/18/20252:55 PM  CHIEF COMPLAINT/PURPOSE OF CONSULT:   Diagnosis: Left lung adenocarcinoma   Cancer Staging  Adenocarcinoma of upper lobe of left lung (HCC) Staging form: Lung, AJCC 8th Edition - Clinical stage from 05/21/2023: cT2, cN0, cM0 - Unsigned   Prior Therapy: Left upper lobe wedge resection and lymph node dissection  Current Therapy: Surveillance   HISTORY OF PRESENT ILLNESS:   Oncology History Overview Note  Patient had a CT lung cancer screening on 12/20/22 that found: a 19.9 mm irregular nodule in the anterior left upper lobe, suspicious for primary bronchogenic carcinoma. She then had an initial PET on 02/19/23 that found: hypermetabolic irregular nodule in the anterior left upper lobe measures 2.1 x 1.6 cm, highly suspicious for primary bronchogenic carcinoma; mildly enlarged/prominent mediastinal lymph nodes are stable from chest CT May 2018; and no evidence of FDG avid metastatic disease in the neck, abdomen, or pelvis.   She then underwent an endobronchial US  followed by a left upper lobectomy on 04/23/23 with Dr. Kerrin. Surgical pathology of the lymph node aspirations were negative. The resection of the nodule on the left upper lobe of the lung revealed: poorly differentiated adenocarcinoma with focal extracellular mucin. Post-op, she developed A-fib and went home on amiodarone  and Eliquis . She presented to the ED on 05/03/23 for drainage of her wound from the partial lobectomy. I independently viewed and interpreted Chest X-ray that found subtle diffuse interstitial opacity left lung status post left upper lobectomy consistent with edema or possible infection. However, she was asymptomatic and discharged.     No history exists.    INTERVAL HISTORY:   Courtney Gill is a 78 y.o.  female presenting to the clinic today for follow-up of Left lung adenocarcinoma.  Today, she states that she is doing well overall. Her appetite level is at 50%. Her energy level is at 25%.  She continues to feel shortness of breath especially with any additional movement aside from walking.  She has to carry her groceries from the front of her house to her kitchen and she has to stop midway to catch her breath.  Reports this has been progressively worsening over the past few months.  She has occasional nausea and vomiting.  She occasionally will feel off balance.  She has insomnia that is chronic.  Has headaches at times.  She was seen by Dr. Rachele within the past week and he had concerns of how low her iron levels were.  She is currently not taking iron  supplements.  Denies any bright red blood per rectum, melena or hematochezia.  She denies ice pica.  Reports she has never been iron deficient.  She has never had a colonoscopy or not that she can remember.  She thinks that October 20 referred her to a GI doctor.  She has not received a phone call yet.   PAST MEDICAL HISTORY:   Past Medical History: Past Medical History:  Diagnosis Date   Angioedema 02/2013   Arthritis    Dyspnea    with exertion   GERD (gastroesophageal reflux disease)    Hypercholesterolemia    Hypertension    pt denies this (04/21/23)   Left upper lobe pulmonary nodule    Lung cancer (HCC)    Migraine    Moderate aortic stenosis    Mean gradient 21.5 mmHg, AVA 1.24 cm by echo 03/2023   Peripheral artery disease     Surgical History: Past Surgical History:  Procedure Laterality Date   CAROTID STENT Right 08/04/2005   DILATION AND CURETTAGE OF UTERUS     EYE SURGERY     muscle moved   INTERCOSTAL NERVE BLOCK  04/23/2023   Procedure: INTERCOSTAL NERVE BLOCK;  Surgeon: Kerrin Elspeth BROCKS, MD;  Location: [facility];  Service: Thoracic;;   LYMPH NODE BIOPSY Left 04/23/2023   Procedure: LYMPH NODE BIOPSY;  Surgeon:  Kerrin Elspeth BROCKS, MD;  Location: [facility] OR;  Service: Thoracic;  Laterality: Left;   TONSILLECTOMY     in early 20's   VIDEO BRONCHOSCOPY WITH ENDOBRONCHIAL ULTRASOUND N/A 04/23/2023   Procedure: VIDEO BRONCHOSCOPY WITH ENDOBRONCHIAL ULTRASOUND;  Surgeon: Kerrin Elspeth BROCKS, MD;  Location: [facility] OR;  Service: Thoracic;  Laterality: N/A;    Social History: Social History   Socioeconomic History   Marital status: Married    Spouse name: Not on file   Number of children: 1   Years of education: Not on file   Highest education level: Not on file  Occupational History   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  Education HS.  Children 1 son, 4 stepchildren.   Social Drivers of Corporate Investment Banker Strain: Not on file  Food Insecurity: No Food Insecurity (04/23/2023)   Hunger Vital Sign    Worried About Running Out of Food in the Last Year: Never true    Ran Out of Food in the Last Year: Never true  Transportation Needs: No Transportation Needs (04/23/2023)   PRAPARE - Administrator, Civil Service (Medical): No    Lack of Transportation (Non-Medical): No  Physical Activity: Not on file  Stress: Not on file  Social Connections: Not on file  Intimate Partner Violence: Not At Risk (04/23/2023)   Humiliation, Afraid, Rape, and Kick questionnaire    Fear of Current or Ex-Partner: No    Emotionally Abused: No    Physically Abused: No    Sexually Abused: No    Family History: Family History  Problem Relation Age of  Onset   Atrial fibrillation  Mother    Diabetes Maternal Grandfather    Cancer Paternal Grandfather     Current Medications:  Current Outpatient Medications:    albuterol  (VENTOLIN  HFA) 108 (90 Base) MCG/ACT inhaler, Inhale 2 puffs into the lungs every 4 (four) hours as needed., Disp: , Rfl:    apixaban  (ELIQUIS ) 5 MG TABS tablet, TAKE ONE TABLET BY MOUTH TWICE DAILY, Disp: 60 tablet, Rfl: 5   candesartan (ATACAND) 4 MG tablet, Take 4 mg by mouth daily., Disp: , Rfl:    cetirizine (ZYRTEC) 10 MG tablet, Take 10 mg by mouth at bedtime., Disp: , Rfl:    Cholecalciferol (VITAMIN D PO), Take 1 tablet by mouth in the morning., Disp: , Rfl:    clobetasol  ointment (TEMOVATE ) 0.05 %, Apply 1 Application topically 2 (two) times daily., Disp: 30 g, Rfl: 3   Cyanocobalamin (VITAMIN B-12 PO), Take 1 tablet by mouth in the morning., Disp: , Rfl:    diphenhydrAMINE  (BENADRYL ) 25 MG tablet, Take 25 mg by mouth at bedtime., Disp: , Rfl:    EPINEPHrine  0.3 mg/0.3 mL IJ SOAJ injection, Inject 0.3 mg into the muscle as needed., Disp: 2 each, Rfl: 1   MAGNESIUM PO, Take 1 capsule by mouth in the morning and at bedtime., Disp: , Rfl:    metoprolol  tartrate (LOPRESSOR ) 25 MG tablet, Take 0.5 tablets (12.5 mg total) by mouth 2 (two) times daily., Disp: 90 tablet, Rfl: 2   Omega-3 Fatty Acids (OMEGA-3 FISH OIL PO), Take 1 capsule by mouth in the morning., Disp: , Rfl:    pantoprazole  (PROTONIX ) 40 MG tablet, Take 40 mg by mouth daily before breakfast., Disp: , Rfl:    Polyethyl Glycol-Propyl Glycol (SYSTANE) 0.4-0.3 % GEL ophthalmic gel, Place 1 Application into both eyes at bedtime as needed (dry/irritated eyes.)., Disp: , Rfl:    rosuvastatin  (CRESTOR ) 40 MG tablet, Take 40 mg by mouth at bedtime., Disp: , Rfl:    vitamin E 400 UNIT capsule, Take 400 Units by mouth in the morning., Disp: , Rfl:   Current Facility-Administered Medications:    ondansetron  (ZOFRAN -ODT) disintegrating tablet 8 mg, 8 mg, Oral, Q8H PRN,    Allergies: Allergies   Allergen Reactions   Bee Venom Anaphylaxis   Wasp Venom Anaphylaxis   Aspirin Swelling    REVIEW OF SYSTEMS:   Review of Systems  Constitutional:  Positive for fatigue.  Respiratory:  Positive for shortness of breath.   Gastrointestinal:  Positive for diarrhea and nausea.  Neurological:  Positive for dizziness and headaches.  Psychiatric/Behavioral:  Positive for sleep disturbance.      VITALS:   Blood pressure (!) 140/69, pulse 62, temperature 97.6 F (36.4 C), temperature source Oral, resp. rate 16, weight 177 lb 3.2 oz (80.4 kg), SpO2 98%.  Wt Readings from Last 3 Encounters:  06/21/24 177 lb 3.2 oz (80.4 kg)  12/17/23 166 lb 0.1 oz (75.3 kg)  12/15/23 168 lb 3.2 oz (76.3 kg)    Body mass index is 30.42 kg/m.  Performance status (ECOG): 1 - Symptomatic but completely ambulatory  PHYSICAL EXAM:   Physical Exam Vitals reviewed.  HENT:     Right Ear: Tympanic membrane normal.     Left Ear: Tympanic membrane normal.     Nose: Nose normal.     Mouth/Throat:     Mouth: Mucous membranes are dry.     Pharynx: No oropharyngeal exudate.  Cardiovascular:     Rate and Rhythm: Normal rate.  Pulmonary:  Effort: Pulmonary effort is normal.     Breath sounds: Normal breath sounds.  Abdominal:     General: Bowel sounds are normal.     Palpations: Abdomen is soft.  Neurological:     Mental Status: She is alert and oriented to person, place, and time.     LABS:      Latest Ref Rng & Units 06/13/2024   12:39 PM 12/10/2023    2:30 PM 05/03/2023   12:29 PM  CBC  WBC 4.0 - 10.5 K/uL 5.5  6.2  10.4   Hemoglobin 12.0 - 15.0 g/dL 89.1  88.3  87.1   Hematocrit 36.0 - 46.0 % 35.9  37.5  41.4   Platelets 150 - 400 K/uL 171  225  343       Latest Ref Rng & Units 12/10/2023    2:30 PM 06/05/2023    1:42 PM 05/03/2023   12:29 PM  CMP  Glucose 70 - 99 mg/dL 83   891   BUN 8 - 23 mg/dL 20   17   Creatinine 9.55 - 1.00 mg/dL 8.35  8.39  8.54   Sodium 135 - 145 mmol/L 137    137   Potassium 3.5 - 5.1 mmol/L 4.5   3.7   Chloride 98 - 111 mmol/L 104   101   CO2 22 - 32 mmol/L 27   23   Calcium  8.9 - 10.3 mg/dL 9.5   9.2   Total Protein 6.5 - 8.1 g/dL 6.7   6.7   Total Bilirubin 0.0 - 1.2 mg/dL 0.7   0.6   Alkaline Phos 38 - 126 U/L 52   71   AST 15 - 41 U/L 21   21   ALT 0 - 44 U/L 14   22      No results found for: CEA1, CEA / No results found for: CEA1, CEA No results found for: PSA1 No results found for: CAN199 No results found for: CAN125  No results found for: TOTALPROTELP, ALBUMINELP, A1GS, A2GS, BETS, BETA2SER, GAMS, MSPIKE, SPEI Lab Results  Component Value Date   TIBC 525 (H) 06/13/2024   FERRITIN 11 06/13/2024   IRONPCTSAT 8 (L) 06/13/2024   No results found for: LDH   STUDIES:   CT CHEST WO CONTRAST Result Date: 06/16/2024 EXAM: CT CHEST WITHOUT CONTRAST 06/13/2024 01:43:15 PM TECHNIQUE: CT of the chest was performed without the administration of intravenous contrast. Multiplanar reformatted images are provided for review. Automated exposure control, iterative reconstruction, and/or weight based adjustment of the mA/kV was utilized to reduce the radiation dose to as low as reasonably achievable. COMPARISON: CT Chest with IV contrast 12/10/2023. CLINICAL HISTORY: Non-small cell lung cancer (NSCLC), monitor, adenocarcinoma of upper lobe of left lung. FINDINGS: MEDIASTINUM: Heart is normal in size. No pericardial effusion. Stable aortic and coronary artery calcifications. The central airways are clear. The esophagus is unremarkable and stable. The thyroid  gland is normal. No new or progressive findings. LYMPH NODES: Stable mediastinal and hilar lymph nodes. No axillary lymphadenopathy. LUNGS AND PLEURA: Stable underlying emphysematous changes and areas of pulmonary scarring. Stable surgical changes from a left lower lobe lobectomy. No findings suspicious for recurrent tumor. No new pulmonary lesions. A few small  scattered sub 4 mm pulmonary nodules are unchanged. No new pulmonary nodules. No pleural nodules. No acute pulmonary process. No focal consolidation or pulmonary edema. No pleural effusion or pneumothorax. SOFT TISSUES/BONES: No breast masses. No supraclavicular or axillary adenopathy. The bony thorax is intact.  No worrisome findings lytic or sclerotic bone lesions. UPPER ABDOMEN: Limited images of the upper abdomen demonstrate no significant upper abdominal findings. No hepatic or adrenal gland lesions. No upper abdominal adenopathy. Stable scattered vascular calcifications. Tracking code: Body Onc IMPRESSION: 1. No evidence of recurrent tumor or new pulmonary lesions. 2. Stable mediastinal and hilar lymph nodes. 3. No evidence of metastatic disease. Electronically signed by: Maude Stammer MD 06/16/2024 10:22 AM EST RP Workstation: [id number]

## 2024-06-23 ENCOUNTER — Inpatient Hospital Stay

## 2024-06-23 VITALS — BP 193/94 | HR 65 | Temp 97.7°F | Resp 18

## 2024-06-23 DIAGNOSIS — C3412 Malignant neoplasm of upper lobe, left bronchus or lung: Secondary | ICD-10-CM | POA: Diagnosis not present

## 2024-06-23 DIAGNOSIS — D500 Iron deficiency anemia secondary to blood loss (chronic): Secondary | ICD-10-CM

## 2024-06-23 DIAGNOSIS — D509 Iron deficiency anemia, unspecified: Secondary | ICD-10-CM | POA: Diagnosis not present

## 2024-06-23 DIAGNOSIS — Z87891 Personal history of nicotine dependence: Secondary | ICD-10-CM | POA: Diagnosis not present

## 2024-06-23 DIAGNOSIS — Z79899 Other long term (current) drug therapy: Secondary | ICD-10-CM | POA: Diagnosis not present

## 2024-06-23 MED ORDER — METHYLPREDNISOLONE SODIUM SUCC 125 MG IJ SOLR
125.0000 mg | Freq: Once | INTRAMUSCULAR | Status: AC
  Administered 2024-06-23: 125 mg via INTRAVENOUS
  Filled 2024-06-23: qty 2

## 2024-06-23 MED ORDER — SODIUM CHLORIDE 0.9 % IV SOLN
50.0000 mg | Freq: Once | INTRAVENOUS | Status: AC
  Administered 2024-06-23: 50 mg via INTRAVENOUS
  Filled 2024-06-23: qty 1

## 2024-06-23 MED ORDER — FAMOTIDINE IN NACL 20-0.9 MG/50ML-% IV SOLN
20.0000 mg | Freq: Once | INTRAVENOUS | Status: AC
  Administered 2024-06-23: 20 mg via INTRAVENOUS
  Filled 2024-06-23: qty 50

## 2024-06-23 MED ORDER — SODIUM CHLORIDE 0.9 % IV SOLN: INTRAVENOUS | Status: DC

## 2024-06-23 MED ORDER — SODIUM CHLORIDE 0.9 % IV SOLN
950.0000 mg | Freq: Once | INTRAVENOUS | Status: AC
  Administered 2024-06-23: 950 mg via INTRAVENOUS
  Filled 2024-06-23: qty 19

## 2024-06-23 MED ORDER — CETIRIZINE HCL 10 MG/ML IV SOLN
10.0000 mg | Freq: Once | INTRAVENOUS | Status: AC
  Administered 2024-06-23: 10 mg via INTRAVENOUS
  Filled 2024-06-23: qty 1

## 2024-06-23 NOTE — Patient Instructions (Signed)
 [facility] [place] - A DEPT OF [facility]  Discharge Instructions: Thank you for choosing [facility] to provide your oncology and hematology care.  If you have a lab appointment with the Cancer Center - please note that after April 8th, 2024, all labs will be drawn in the cancer center.  You do not have to check in or register with the main entrance as you have in the past but will complete your check-in in the cancer center.  Wear comfortable clothing and clothing appropriate for easy access to any Portacath or PICC line.   We strive to give you quality time with your provider. You may need to reschedule your appointment if you arrive late (15 or more minutes).  Arriving late affects you and other patients whose appointments are after yours.  Also, if you miss three or more appointments without notifying the office, you may be dismissed from the clinic at the provider's discretion.      For prescription refill requests, have your pharmacy contact our office and allow 72 hours for refills to be completed.    Today you received Infed IV iron infusion.    BELOW ARE SYMPTOMS THAT SHOULD BE REPORTED IMMEDIATELY: *FEVER GREATER THAN 100.4 F (38 C) OR HIGHER *CHILLS OR SWEATING *NAUSEA AND VOMITING THAT IS NOT CONTROLLED WITH YOUR NAUSEA MEDICATION *UNUSUAL SHORTNESS OF BREATH *UNUSUAL BRUISING OR BLEEDING *URINARY PROBLEMS (pain or burning when urinating, or frequent urination) *BOWEL PROBLEMS (unusual diarrhea, constipation, pain near the anus) TENDERNESS IN MOUTH AND THROAT WITH OR WITHOUT PRESENCE OF ULCERS (sore throat, sores in mouth, or a toothache) UNUSUAL RASH, SWELLING OR PAIN  UNUSUAL VAGINAL DISCHARGE OR ITCHING   Items with * indicate a potential emergency and should be followed up as soon as possible or go to the Emergency Department if any problems should occur.  Please show the CHEMOTHERAPY ALERT CARD or IMMUNOTHERAPY ALERT CARD at  check-in to the Emergency Department and triage nurse.  Should you have questions after your visit or need to cancel or reschedule your appointment, please contact [facility] [place] - A DEPT OF [facility] [phone]  and follow the prompts.  Office hours are 8:00 a.m. to 4:30 p.m. Monday - Friday. Please note that voicemails left after 4:00 p.m. may not be returned until the following business day.  We are closed weekends and major holidays. You have access to a nurse at all times for urgent questions. Please call the main number to the clinic [phone] and follow the prompts.  For any non-urgent questions, you may also contact your provider using MyChart. We now offer e-Visits for anyone 42 and older to request care online for non-urgent symptoms. For details visit [email or web address].   Also download the MyChart app! Go to the app store, search "MyChart", open the app, select [facility], and log in with your MyChart username and password.

## 2024-06-23 NOTE — Progress Notes (Signed)
 Patient presents today for iron infusion.  Patient is in satisfactory condition with no new complaints voiced.  Vital signs are stable.  We will proceed with infusion per provider orders.    Patient took Tylenol  1,000 mg at home prior to arrival. Peripheral IV started with good blood return pre and post infusion. During 30 minute time after IV iron was completed, patient's BP noted to be 185/83 and after patient beeped complete from 30 minute wait time BP was re-checked 193/94. Delon Hope, NP made clarified that patient took her Metoprolol  12.5 prior and patient stated she did. Patient denies any headaches or dizziness at this time. Delon Hope, NP made aware and stated patient is okay to be discharged home and to re-check BP at home which patient confirmed she has a BP machine at home. Patient made aware and verbalized understanding.  Infed 1,000 mg IV iron given today per MD orders. Tolerated infusion without adverse affects. Vital signs stable. No complaints at this time. Discharged from clinic ambulatory in stable condition. Alert and oriented x 3. F/U with [facility] as scheduled.

## 2024-06-24 ENCOUNTER — Telehealth: Payer: Self-pay | Admitting: Emergency Medicine

## 2024-06-24 NOTE — Telephone Encounter (Signed)
 Patient concerned about blood pressure 174/91. Patient denies being symptomatic. Patient to continue to monitor blood pressure and follow-up if it continues to increase. Patient made aware of stroke signs/symptoms and instructed to go directly to ED if any signs. Patient verbalized understanding.

## 2024-06-26 ENCOUNTER — Other Ambulatory Visit: Payer: Self-pay | Admitting: Cardiovascular Disease

## 2024-06-27 ENCOUNTER — Other Ambulatory Visit: Payer: Self-pay | Admitting: Family Medicine

## 2024-06-28 NOTE — Telephone Encounter (Signed)
 Pt last saw Dr Burton 09/16/23, last labs 01/19/24 Creat 1.54, age 78, weight 80.4kg, based on specified criteria pt is on appropriate dosage of Eliquis  5mg  BID for afib.  Will refill rx.

## 2024-07-19 ENCOUNTER — Encounter (INDEPENDENT_AMBULATORY_CARE_PROVIDER_SITE_OTHER): Payer: Self-pay | Admitting: *Deleted

## 2024-07-22 DIAGNOSIS — N184 Chronic kidney disease, stage 4 (severe): Secondary | ICD-10-CM | POA: Diagnosis not present

## 2024-07-22 DIAGNOSIS — I739 Peripheral vascular disease, unspecified: Secondary | ICD-10-CM | POA: Diagnosis not present

## 2024-07-22 DIAGNOSIS — I10 Essential (primary) hypertension: Secondary | ICD-10-CM | POA: Diagnosis not present

## 2024-07-22 DIAGNOSIS — N183 Chronic kidney disease, stage 3 unspecified: Secondary | ICD-10-CM | POA: Diagnosis not present

## 2024-07-22 DIAGNOSIS — R809 Proteinuria, unspecified: Secondary | ICD-10-CM | POA: Diagnosis not present

## 2024-07-22 DIAGNOSIS — E782 Mixed hyperlipidemia: Secondary | ICD-10-CM | POA: Diagnosis not present

## 2024-07-22 DIAGNOSIS — Z87891 Personal history of nicotine dependence: Secondary | ICD-10-CM | POA: Diagnosis not present

## 2024-07-22 DIAGNOSIS — R011 Cardiac murmur, unspecified: Secondary | ICD-10-CM | POA: Diagnosis not present

## 2024-07-22 DIAGNOSIS — I714 Abdominal aortic aneurysm, without rupture, unspecified: Secondary | ICD-10-CM | POA: Diagnosis not present

## 2024-07-22 DIAGNOSIS — I350 Nonrheumatic aortic (valve) stenosis: Secondary | ICD-10-CM | POA: Diagnosis not present

## 2024-07-22 DIAGNOSIS — Z0000 Encounter for general adult medical examination without abnormal findings: Secondary | ICD-10-CM | POA: Diagnosis not present

## 2024-07-22 DIAGNOSIS — I129 Hypertensive chronic kidney disease with stage 1 through stage 4 chronic kidney disease, or unspecified chronic kidney disease: Secondary | ICD-10-CM | POA: Diagnosis not present

## 2024-08-01 ENCOUNTER — Encounter: Payer: Self-pay | Admitting: *Deleted

## 2024-08-10 ENCOUNTER — Ambulatory Visit (INDEPENDENT_AMBULATORY_CARE_PROVIDER_SITE_OTHER): Admitting: Gastroenterology

## 2024-08-10 ENCOUNTER — Encounter (INDEPENDENT_AMBULATORY_CARE_PROVIDER_SITE_OTHER): Payer: Self-pay | Admitting: Gastroenterology

## 2024-08-10 VITALS — BP 125/73 | HR 66 | Temp 97.5°F | Ht 63.0 in | Wt 178.3 lb

## 2024-08-10 DIAGNOSIS — D509 Iron deficiency anemia, unspecified: Secondary | ICD-10-CM

## 2024-08-10 DIAGNOSIS — R1319 Other dysphagia: Secondary | ICD-10-CM | POA: Insufficient documentation

## 2024-08-10 DIAGNOSIS — D500 Iron deficiency anemia secondary to blood loss (chronic): Secondary | ICD-10-CM

## 2024-08-10 DIAGNOSIS — R1310 Dysphagia, unspecified: Secondary | ICD-10-CM

## 2024-08-10 NOTE — Patient Instructions (Signed)
 It was very nice to meet you today, as dicussed with will plan for the following :  1) Upper endoscopy with dilation and colonoscopy

## 2024-08-10 NOTE — Progress Notes (Signed)
 Courtney Gill , M.D. Gastroenterology & Hepatology [facility] Gastroenterology [address] [place], [place] [address] Primary Care Physician: Courtney Norleen PEDLAR, MD [address] [place] Courtney [place] [address]  Chief Complaint:  iron  deficiency anemia and Dysphagia   History of Present Illness:  Courtney Gill is a 79 y.o. female with history of lung adenocarcinoma status post lobectomy ,  A-fib on Eliquis  who presents for evaluation of iron  deficiency anemia and Dysphagia   Patient reports occasionally she will have solid food dysphagia especially with pills.  No history of food impactions.  Have not seen any blood in stool.  Never had any colonoscopies The patient denies having any nausea, vomiting, fever, chills, hematochezia, melena, hematemesis, abdominal distention, abdominal pain, diarrhea, jaundice, pruritus or weight loss.  Labs from 06/13/2024 show worsening anemia with a hemoglobin of 11.6-->10.8/hematocrit 35.9, ferritin 11 and iron  saturation 8%. TIBC elevated 525.   Last ZHI:bzjmd ago Last Colonoscopy:none  Echo L LVEF: 70-75% with The aortic valve is tricuspid. There is moderate calcification of the  aortic valve. There is moderate thickening of the aortic valve. Aortic  valve regurgitation is mild. Moderate aortic valve stenosis. Aortic valve  area, by VTI measures 1.08 cm.  Aortic valve mean gradient measures 25.8 mmHg. Aortic valve Vmax measures  3.30 m/s.   Past Medical History: Past Medical History:  Diagnosis Date   Angioedema 02/2013   Arthritis    Dyspnea    with exertion   GERD (gastroesophageal reflux disease)    Hypercholesterolemia    Hypertension    pt denies this (04/21/23)   Left upper lobe pulmonary nodule    Lung cancer (HCC)    Migraine    Moderate aortic stenosis    Mean gradient 21.5 mmHg, AVA 1.24 cm by echo 03/2023   Peripheral artery disease     Past Surgical History: Past Surgical History:  Procedure  Laterality Date   CAROTID STENT Right 08/04/2005   DILATION AND CURETTAGE OF UTERUS     EYE SURGERY     muscle moved   INTERCOSTAL NERVE BLOCK  04/23/2023   Procedure: INTERCOSTAL NERVE BLOCK;  Surgeon: Kerrin Elspeth JAYSON, MD;  [facility] OR;  Service: Thoracic;;   LYMPH NODE BIOPSY Left 04/23/2023   Procedure: LYMPH NODE BIOPSY;  Surgeon: Kerrin Elspeth JAYSON, MD;  Location: [facility] OR;  Service: Thoracic;  Laterality: Left;   TONSILLECTOMY     in early 20's   VIDEO BRONCHOSCOPY WITH ENDOBRONCHIAL ULTRASOUND N/A 04/23/2023   Procedure: VIDEO BRONCHOSCOPY WITH ENDOBRONCHIAL ULTRASOUND;  Surgeon: Kerrin Elspeth JAYSON, MD;  Location: [facility] OR;  Service: Thoracic;  Laterality: N/A;    Family History: Family History  Problem Relation Age of Onset   Atrial fibrillation Mother    Diabetes Maternal Grandfather    Cancer Paternal Grandfather     Social History:Tobacco Use History[1] Social History   Substance and Sexual Activity  Alcohol Use Not Currently   Social History   Substance and Sexual Activity  Drug Use No    Allergies: Allergies[2]  Medications: Current Outpatient Medications  Medication Sig Dispense Refill   albuterol  (VENTOLIN  HFA) 108 (90 Base) MCG/ACT inhaler Inhale 2 puffs into the lungs every 4 (four) hours as needed.     candesartan (ATACAND) 4 MG tablet Take 4 mg by mouth daily.     cetirizine  (ZYRTEC ) 10 MG tablet Take 10 mg by mouth at bedtime.     Cholecalciferol (VITAMIN D PO) Take 1 tablet  by mouth in the morning.     clobetasol  ointment (TEMOVATE ) 0.05 % Apply 1 Application topically 2 (two) times daily. 30 g 3   Cyanocobalamin (VITAMIN B-12 PO) Take 1 tablet by mouth in the morning.     diphenhydrAMINE  (BENADRYL ) 25 MG tablet Take 25 mg by mouth at bedtime.     ELIQUIS  5 MG TABS tablet TAKE ONE TABLET BY MOUTH TWICE DAILY 60 tablet 5   EPINEPHrine  0.3 mg/0.3 mL IJ SOAJ injection Inject 0.3 mg into the muscle as needed. 2 each 1   MAGNESIUM PO Take 1  capsule by mouth in the morning and at bedtime.     metoprolol  tartrate (LOPRESSOR ) 25 MG tablet Take 0.5 tablets (12.5 mg total) by mouth 2 (two) times daily. 90 tablet 2   Omega-3 Fatty Acids (OMEGA-3 FISH OIL PO) Take 1 capsule by mouth in the morning.     pantoprazole  (PROTONIX ) 40 MG tablet Take 40 mg by mouth daily before breakfast.     Polyethyl Glycol-Propyl Glycol (SYSTANE) 0.4-0.3 % GEL ophthalmic gel Place 1 Application into both eyes at bedtime as needed (dry/irritated eyes.).     rosuvastatin  (CRESTOR ) 40 MG tablet Take 40 mg by mouth at bedtime.     vitamin E 400 UNIT capsule Take 400 Units by mouth in the morning.     Current Facility-Administered Medications  Medication Dose Route Frequency Provider Last Rate Last Admin   ondansetron  (ZOFRAN -ODT) disintegrating tablet 8 mg  8 mg Oral Q8H PRN         Review of Systems: GENERAL: negative for malaise, night sweats HEENT: No changes in hearing or vision, no nose bleeds or other nasal problems. NECK: Negative for lumps, goiter, pain and significant neck swelling RESPIRATORY: Negative for cough, wheezing CARDIOVASCULAR: Negative for chest pain, leg swelling, palpitations, orthopnea GI: SEE HPI MUSCULOSKELETAL: Negative for joint pain or swelling, back pain, and muscle pain. SKIN: Negative for lesions, rash HEMATOLOGY Negative for prolonged bleeding, bruising easily, and swollen nodes. ENDOCRINE: Negative for cold or heat intolerance, polyuria, polydipsia and goiter. NEURO: negative for tremor, gait imbalance, syncope and seizures. The remainder of the review of systems is noncontributory.   Physical Exam: BP 125/73   Pulse 66   Temp (!) 97.5 F (36.4 C)   Ht 5' 3 (1.6 m)   Wt 178 lb 4.8 oz (80.9 kg)   BMI 31.58 kg/m  GENERAL: The patient is AO x3, in no acute distress. HEENT: Head is normocephalic and atraumatic. EOMI are intact. Mouth is well hydrated and without lesions. NECK: Supple. No masses LUNGS: Clear to  auscultation. No presence of rhonchi/wheezing/rales. Adequate chest expansion HEART: Systolic murmur  ABDOMEN: Soft, nontender, no guarding, no peritoneal signs, and nondistended. BS +. No masses.  Imaging/Labs: as above     Latest Ref Rng & Units 06/13/2024   12:39 PM 12/10/2023    2:30 PM 05/03/2023   12:29 PM  CBC  WBC 4.0 - 10.5 K/uL 5.5  6.2  10.4   Hemoglobin 12.0 - 15.0 g/dL 89.1  88.3  87.1   Hematocrit 36.0 - 46.0 % 35.9  37.5  41.4   Platelets 150 - 400 K/uL 171  225  343    Lab Results  Component Value Date   IRON  42 06/13/2024   TIBC 525 (H) 06/13/2024   FERRITIN 11 06/13/2024    I personally reviewed and interpreted the available labs, imaging and endoscopic files.  Impression and Plan:  TYRELL BRERETON is a  79 y.o. female with history of lung adenocarcinoma status post lobectomy ,  A-fib on Eliquis  who presents for evaluation of iron  deficiency anemia and Dysphagia   #Iron  deficiency anemia #Dysphagia    worsening anemia with a hemoglobin of 11.6-->10.8/hematocrit 35.9, ferritin 11. No overt bleeding  Patient had upper endoscopy many years ago never had a colonoscopy  As per ACG guidelines , bidirectional endoscopy is recommended over iron  replacement therapy only. We will proceed along with Upper endoscopy with small bowel biopsies and Colonoscopy   Patient has solid food dysphagia which could be esophageal web ring or stricture, recommend upper endoscopy with dilation  Patient has clinical indication to continue anticoagulation and with new onset iron  deficiency anemia further investigation is warranted  Will obtain cardiology clearance to hold Eliquis  for 2 days and if patient is a candidate to undergo MAC given history of moderate aortic stenosis  All questions were answered.      Tal Kempker Faizan Dorris Pierre, MD Gastroenterology and Hepatology [facility] Gastroenterology   This chart has been completed using [facility] Dictation software,  and while attempts have been made to ensure accuracy , certain words and phrases may not be transcribed as intended      [1]  Social History Tobacco Use  Smoking Status Former   Types: Cigarettes   Start date: 04/22/2023   Quit date: 04/22/1963   Years since quitting: 61.3  Smokeless Tobacco Never  [2]  Allergies Allergen Reactions   Bee Venom Anaphylaxis   Wasp Venom Anaphylaxis   Aspirin Swelling

## 2024-08-15 ENCOUNTER — Telehealth (INDEPENDENT_AMBULATORY_CARE_PROVIDER_SITE_OTHER): Payer: Self-pay

## 2024-08-15 NOTE — Telephone Encounter (Signed)
" ° ° °  08/15/2024  [name] 12-01-1945  What type of surgery is being performed? Colonoscopy and upper endoscopy  When is surgery scheduled? To be determined  What type of clearance is required (medical or pharmacy to hold medication or both? Medication   Are there any medications that need to be held prior to surgery and how long? Eliquis , hold 2 days prior.  Name of physician performing surgery?  Dr. Cinderella Rouse Gastroenterology at [facility] Phone: [phone] Fax: [phone]  Anethesia type (none, local, MAC, general)? MAC   "

## 2024-08-15 NOTE — Telephone Encounter (Signed)
 Pharmacy please advise on holding Eliquis  for 2 days prior to  Colonoscopy and upper endoscopy  scheduled for TBD. Thank you.

## 2024-08-16 NOTE — Telephone Encounter (Signed)
 Primary Cardiologist: Darryle ONEIDA Decent, MD  Chart reviewed as part of pre-operative protocol coverage. Because of Courtney Gill's past medical history and time since last visit, he/she will require a follow-up visit in order to better assess preoperative cardiovascular risk.  Pre-op covering staff: - She has an upcoming appointment with Dr. Decent 08/29/24. Appointment notes already updated. - Please contact requesting surgeon's office via preferred method (i.e, phone, fax) to inform them of need for appointment prior to surgery.  Pharmacy has already addressed holding Eliquis .  Courtney GORMAN Cleaves, NP  08/16/2024, 10:54 AM

## 2024-08-16 NOTE — Telephone Encounter (Signed)
 Patient with diagnosis of A Fib on Eliquis  for anticoagulation.    Procedure: Colonoscopy and upper endoscopy  Date of procedure: TBD   CHA2DS2-VASc Score = 5  This indicates a 7.2% annual risk of stroke. The patient's score is based upon: CHF History: 0 HTN History: 1 Diabetes History: 0 Stroke History: 0 Vascular Disease History: 1 Age Score: 2 Gender Score: 1    CrCl 40 ml/min Platelet count 171k  Patient has not had an Afib/aflutter ablation in the last 3 months, DCCV within the last 4 weeks or a watchman implanted in the last 45 days   Per office protocol, patient can hold Eliquis  for 2 days prior to procedure.  **This guidance is not considered finalized until pre-operative APP has relayed final recommendations.**

## 2024-08-22 NOTE — Progress Notes (Unsigned)
" °  Cardiology Office Note:  .   Date:  08/22/2024  ID:  Courtney Gill, DOB 11-02-1945, MRN [id number] PCP: Shona Norleen PEDLAR, MD  [facility] Providers Cardiologist:  Darryle ONEIDA Decent, MD   History of Present Illness: .   No chief complaint on file.   Courtney Gill is a 79 y.o. female with below history who presents for follow-up.   History of Present Illness               Problem List  Lung CA -Left upper lobe lung CA 2. Paroxysmal Afib  -04/2023 -> after lung CA surgery -6% burden on monitor  3. Aortic stenosis -moderate 03/19/2023 4. HLD 5. Carotid artery disease -s/p R carotid stent 2007 6. COPD 7. Alpha Gal     ROS: All other ROS reviewed and negative. Pertinent positives noted in the HPI.     Studies Reviewed: SABRA       TTE 01/20/2024  1. Left ventricular ejection fraction, by estimation, is 70 to 75%. The  left ventricle has hyperdynamic function. Left ventricular endocardial  border not optimally defined to evaluate regional wall motion. There is  moderate left ventricular hypertrophy.   Left ventricular diastolic parameters are indeterminate. Elevated left  ventricular end-diastolic pressure.   2. Right ventricular systolic function is normal. The right ventricular  size is normal. Tricuspid regurgitation signal is inadequate for assessing  PA pressure.   3. Left atrial size was moderately dilated.   4. Right atrial size was mildly dilated.   5. The mitral valve is abnormal. Mild mitral valve regurgitation. No  evidence of mitral stenosis. Severe mitral annular calcification.   6. The aortic valve is tricuspid. There is moderate calcification of the  aortic valve. There is moderate thickening of the aortic valve. Aortic  valve regurgitation is mild. Moderate aortic valve stenosis. Aortic valve  area, by VTI measures 1.08 cm.  Aortic valve mean gradient measures 25.8 mmHg. Aortic valve Vmax measures  3.30 m/s.   7. The inferior vena cava is normal in size  with greater than 50%  respiratory variability, suggesting right atrial pressure of 3 mmHg.   Physical Exam:   VS:  There were no vitals taken for this visit.   Wt Readings from Last 3 Encounters:  08/10/24 178 lb 4.8 oz (80.9 kg)  06/21/24 177 lb 3.2 oz (80.4 kg)  12/17/23 166 lb 0.1 oz (75.3 kg)    GEN: Well nourished, well developed in no acute distress NECK: No JVD; No carotid bruits CARDIAC: ***RRR, no murmurs, rubs, gallops RESPIRATORY:  Clear to auscultation without rales, wheezing or rhonchi  ABDOMEN: Soft, non-tender, non-distended EXTREMITIES:  No edema; No deformity  ASSESSMENT AND PLAN: .   Assessment and Plan                 {Are you ordering a CV Procedure (e.g. stress test, cath, DCCV, TEE, etc)?   Press F2        :[id number]}   Follow-up: No follow-ups on file.  Signed, Darryle ONEIDA. Decent, MD, [facility]  [facility]  [address] [place], [place] [address] [phone]  9:36 AM   "

## 2024-08-29 ENCOUNTER — Ambulatory Visit: Admitting: Cardiovascular Disease

## 2024-08-29 DIAGNOSIS — I779 Disorder of arteries and arterioles, unspecified: Secondary | ICD-10-CM

## 2024-08-29 DIAGNOSIS — I480 Paroxysmal atrial fibrillation: Secondary | ICD-10-CM

## 2024-08-29 DIAGNOSIS — I10 Essential (primary) hypertension: Secondary | ICD-10-CM

## 2024-08-29 DIAGNOSIS — I350 Nonrheumatic aortic (valve) stenosis: Secondary | ICD-10-CM

## 2024-08-29 DIAGNOSIS — E782 Mixed hyperlipidemia: Secondary | ICD-10-CM

## 2024-09-15 ENCOUNTER — Ambulatory Visit (HOSPITAL_COMMUNITY): Payer: Medicare HMO

## 2024-09-16 ENCOUNTER — Ambulatory Visit: Payer: Medicare Other | Admitting: Allergy & Immunology

## 2024-09-21 ENCOUNTER — Inpatient Hospital Stay: Attending: Oncology

## 2024-10-31 ENCOUNTER — Ambulatory Visit: Admitting: Cardiovascular Disease

## 2024-12-19 ENCOUNTER — Inpatient Hospital Stay

## 2024-12-19 ENCOUNTER — Other Ambulatory Visit (HOSPITAL_COMMUNITY)

## 2024-12-27 ENCOUNTER — Inpatient Hospital Stay: Admitting: Oncology
